# Patient Record
Sex: Female | Born: 1945 | Race: Black or African American | Hispanic: No | Marital: Single | Smoking: Never smoker
Health system: Southern US, Community
[De-identification: ages and names within clinical notes are randomized; demographics above are authoritative.]

## PROBLEM LIST (undated history)

## (undated) DIAGNOSIS — F319 Bipolar disorder, unspecified: Secondary | ICD-10-CM

## (undated) DIAGNOSIS — I10 Essential (primary) hypertension: Secondary | ICD-10-CM

## (undated) DIAGNOSIS — E669 Obesity, unspecified: Secondary | ICD-10-CM

## (undated) HISTORY — PX: ABDOMINAL HYSTERECTOMY: SHX81

---

## 2005-01-29 ENCOUNTER — Emergency Department (HOSPITAL_COMMUNITY): Admission: EM | Admit: 2005-01-29 | Discharge: 2005-01-29 | Payer: Self-pay | Admitting: Emergency Medicine

## 2005-01-29 IMAGING — CR DG CHEST 1V PORT
1 series · 1 of 1 positions shown · non-contrast
Comparison: none

CLINICAL DATA: Syncope.  Chest pain and hypertension. 
 CHEST PORTABLE ? 1 VIEW:
 The heart size and mediastinal contours are within normal limits.  The lungs are clear.

[view not recorded]
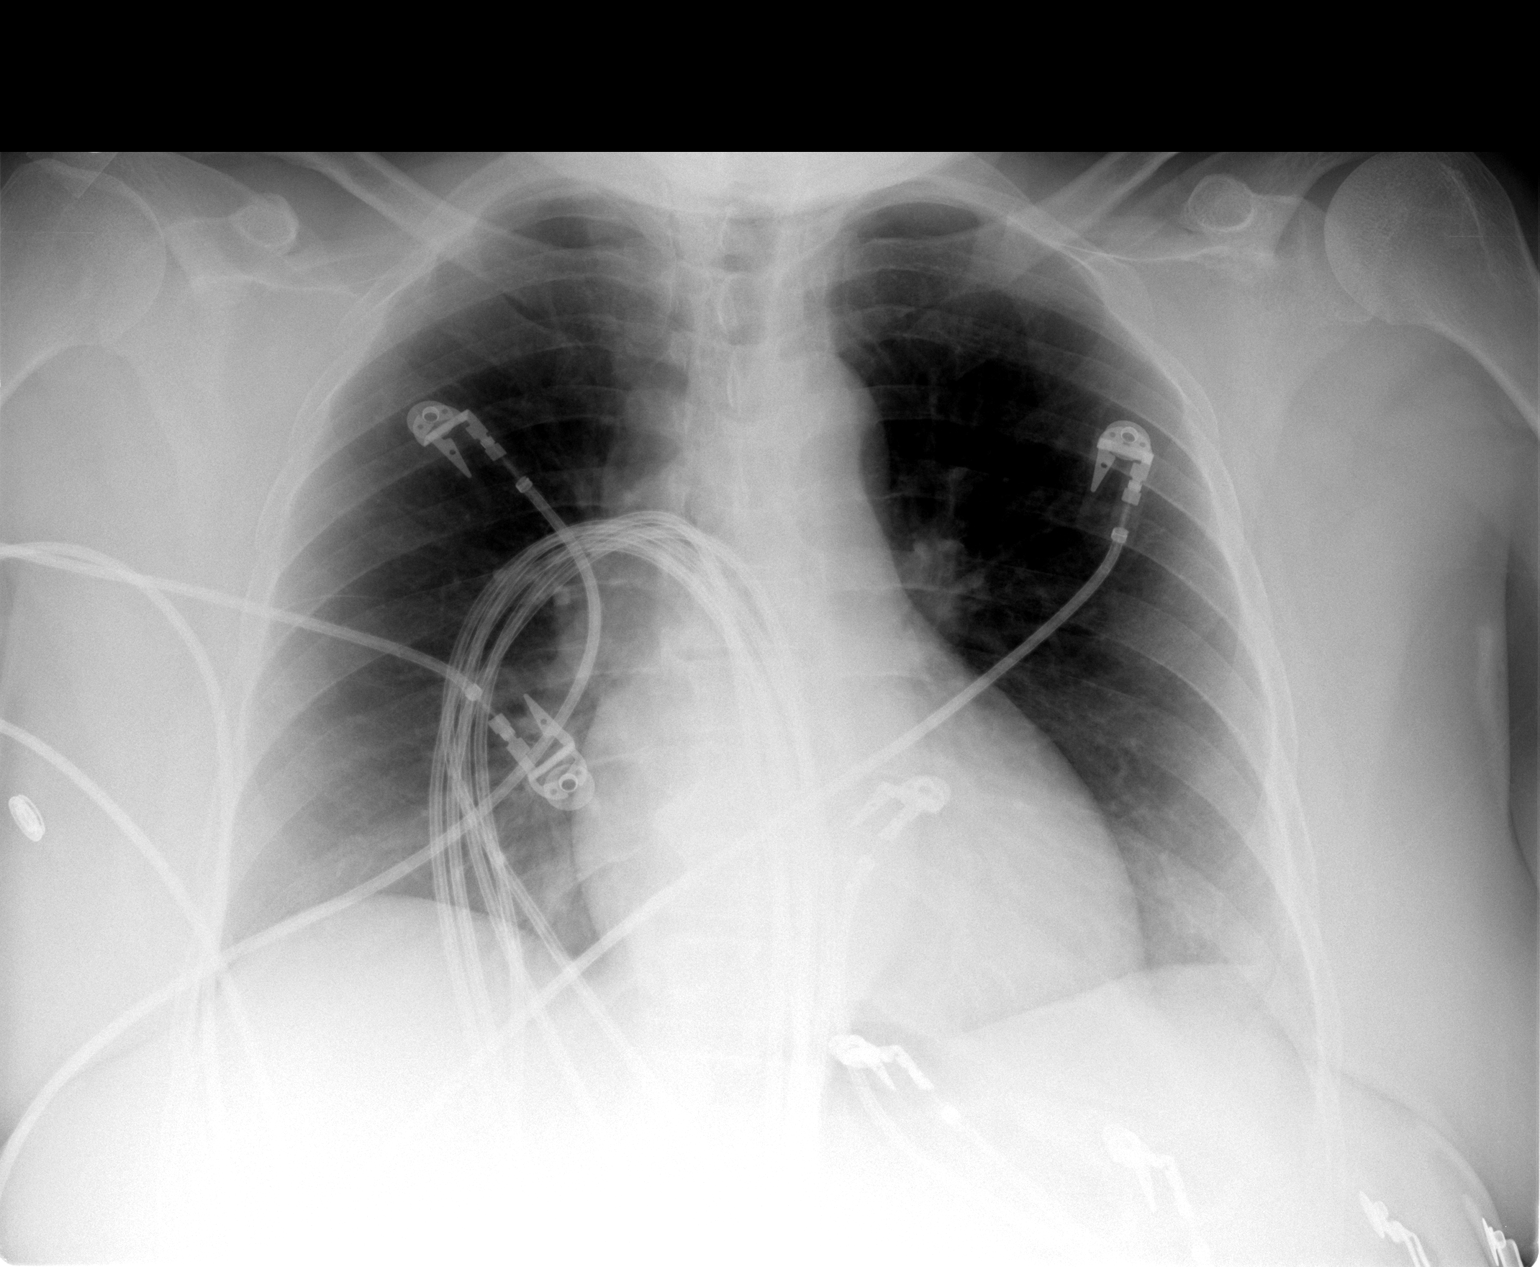

[1 of 1 positions shown; findings below may reference images not displayed]

IMPRESSION: No acute disease.

## 2005-01-29 IMAGING — CT CT HEAD W/O CM
1 of 2 series · 13 of 30 positions shown, 17 images · IV contrast (agent unspecified)
Comparison: none

CLINICAL DATA: Syncope vs. seizure.
 HEAD CT WITHOUT CONTRAST:
 5 mm scans are made through the whole head.  No prior study is available for comparison.  
 Brain has a normal appearance without atrophy, stroke, mass, hemorrhage, hydrocephalus or extraaxial collection.  The calvarium is unremarkable.  The paranasal sinus, middle ears and mastoids are clear.

[Series 2: brain · axial · 0.47mm/px · z∈[+126,+249]mm · 13 of 28 slices shown, 17 images]
[im 2/28  brain]
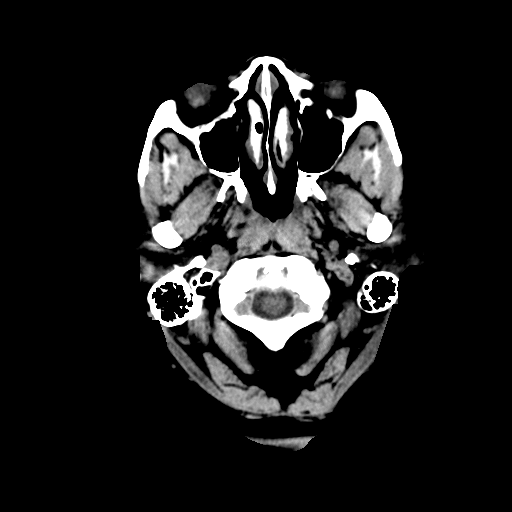
[im 2/28  bone]
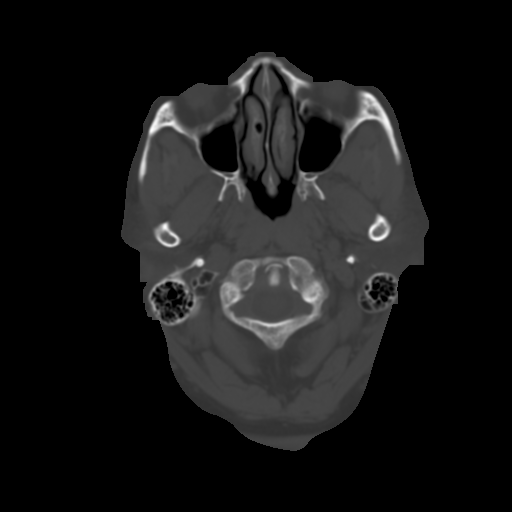
[im 4/28  brain]
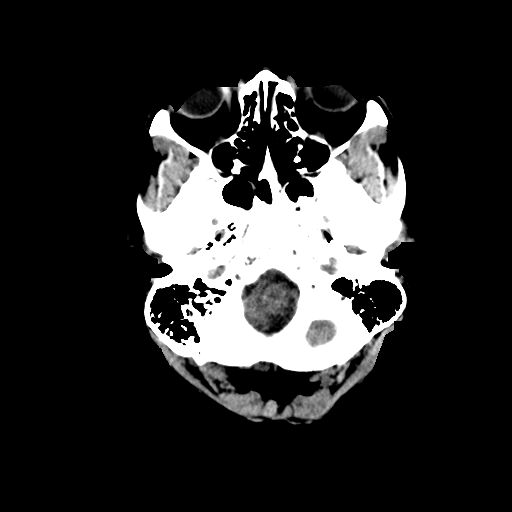
[im 6/28  brain]
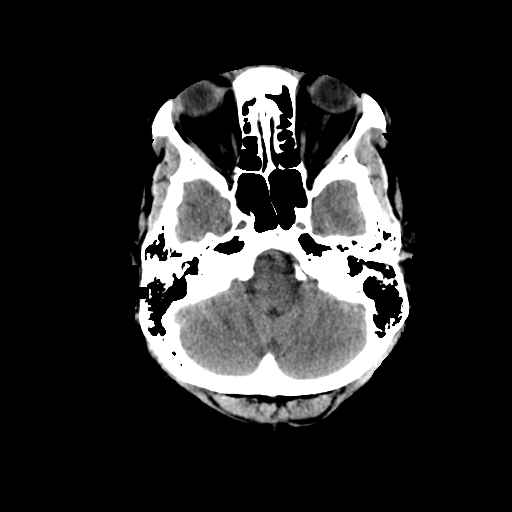
[im 8/28  brain]
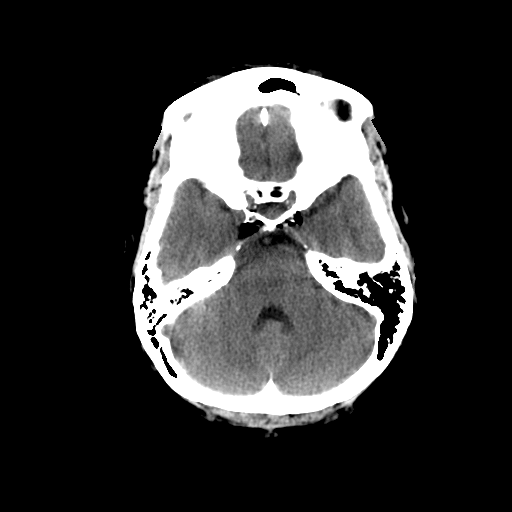
[im 10/28  brain]
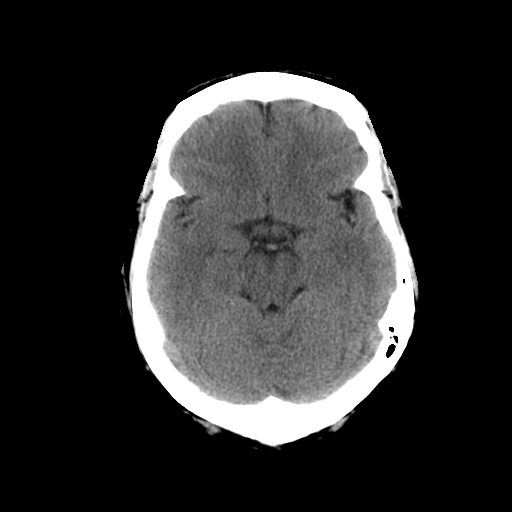
[im 10/28  bone]
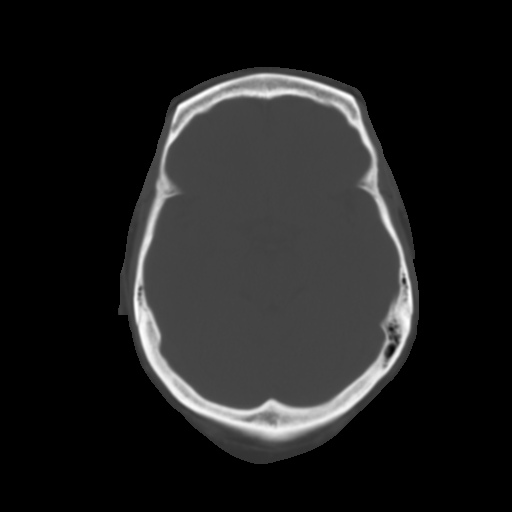
[im 12/28  brain]
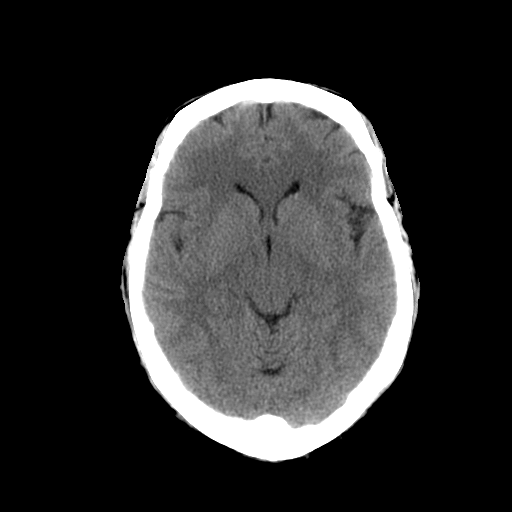
[im 14/28  brain]
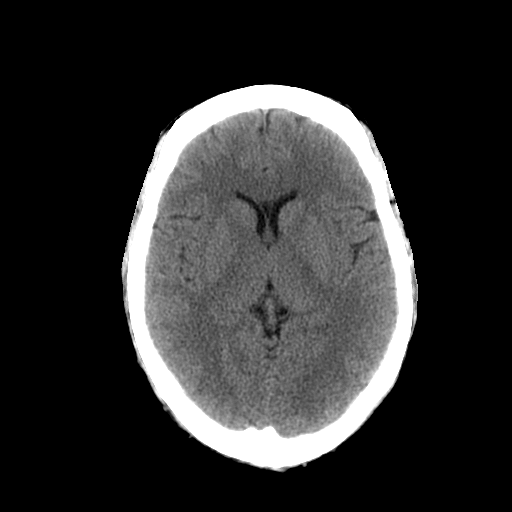
[im 16/28  brain]
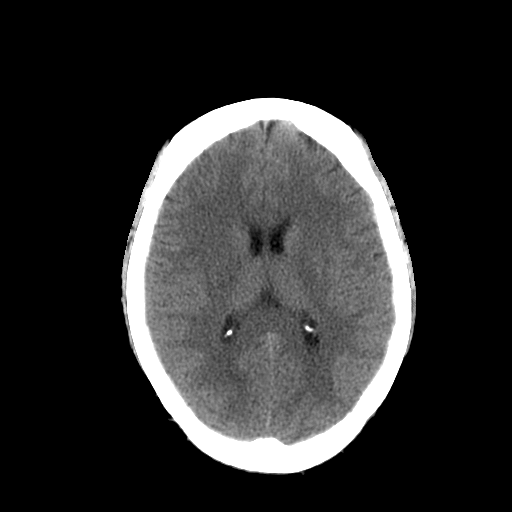
[im 18/28  brain]
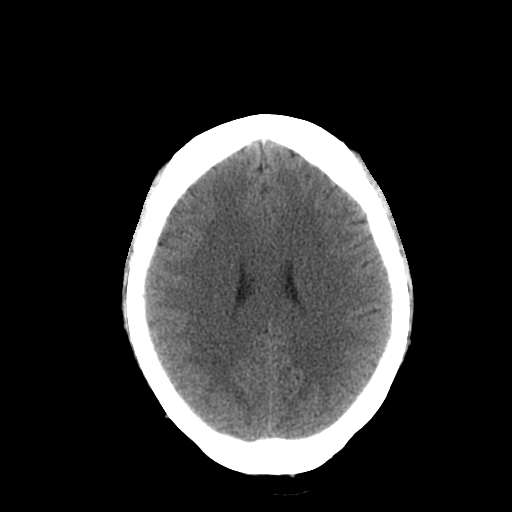
[im 18/28  bone]
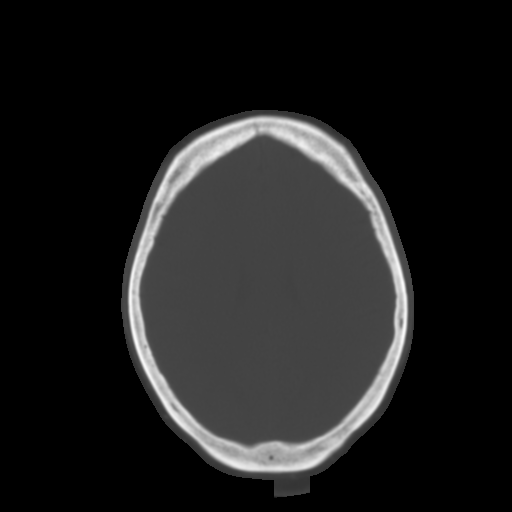
[im 20/28  brain]
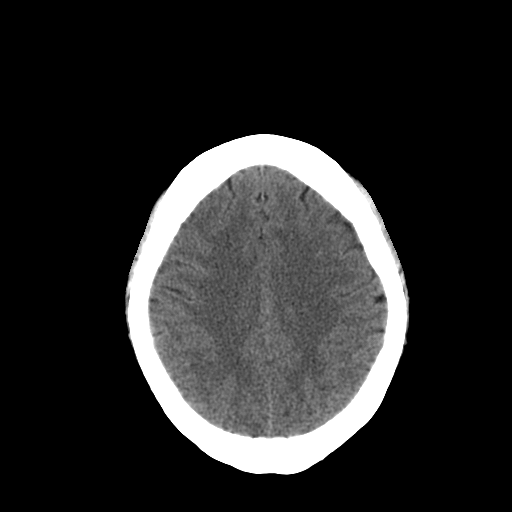
[im 22/28  brain]
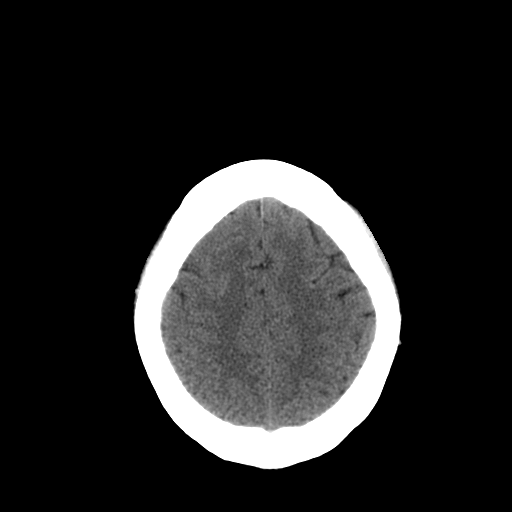
[im 24/28  brain]
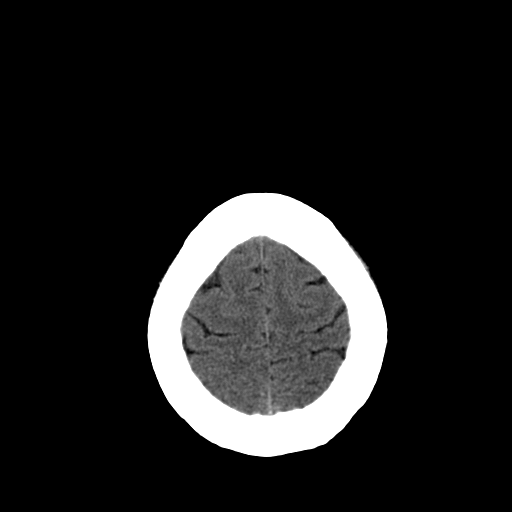
[im 26/28  brain]
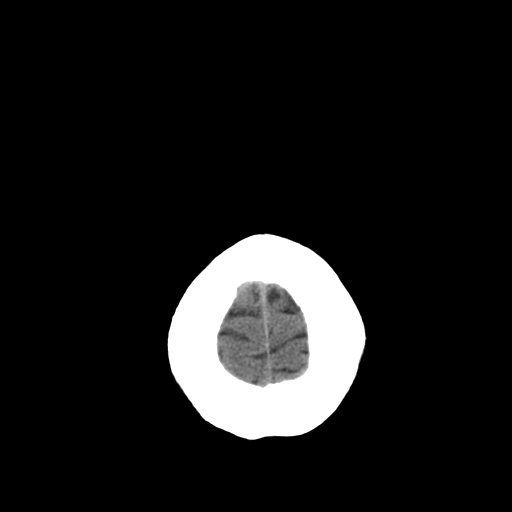
[im 26/28  bone]
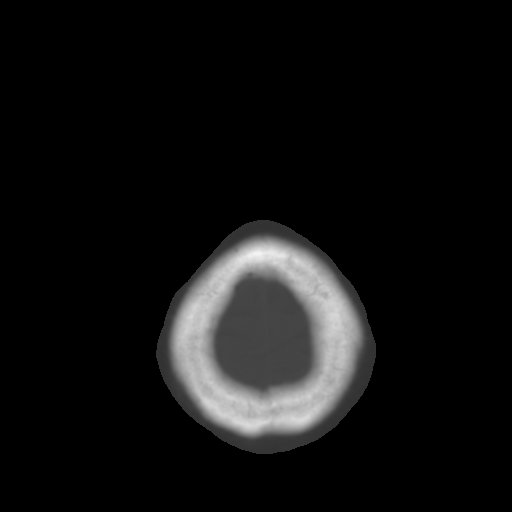

[13 of 30 positions shown; findings below may reference images not displayed]

IMPRESSION: Normal head CT.

## 2008-02-07 ENCOUNTER — Ambulatory Visit: Payer: Self-pay | Admitting: Gastroenterology

## 2008-02-13 ENCOUNTER — Ambulatory Visit: Payer: Self-pay | Admitting: Gastroenterology

## 2008-02-13 ENCOUNTER — Encounter: Payer: Self-pay | Admitting: Gastroenterology

## 2008-02-14 ENCOUNTER — Encounter: Payer: Self-pay | Admitting: Gastroenterology

## 2011-04-04 ENCOUNTER — Emergency Department (HOSPITAL_COMMUNITY)
Admission: EM | Admit: 2011-04-04 | Discharge: 2011-04-04 | Disposition: A | Discharge status: Home / Self Care | Payer: Medicare Other | Attending: Emergency Medicine | Admitting: Emergency Medicine

## 2011-04-04 ENCOUNTER — Emergency Department (HOSPITAL_COMMUNITY): Payer: Medicare Other

## 2011-04-04 DIAGNOSIS — R209 Unspecified disturbances of skin sensation: Secondary | ICD-10-CM | POA: Insufficient documentation

## 2011-04-04 DIAGNOSIS — I10 Essential (primary) hypertension: Secondary | ICD-10-CM | POA: Insufficient documentation

## 2011-04-04 DIAGNOSIS — R404 Transient alteration of awareness: Secondary | ICD-10-CM | POA: Insufficient documentation

## 2011-04-04 DIAGNOSIS — Z79899 Other long term (current) drug therapy: Secondary | ICD-10-CM | POA: Insufficient documentation

## 2011-04-04 DIAGNOSIS — R079 Chest pain, unspecified: Secondary | ICD-10-CM | POA: Insufficient documentation

## 2011-04-04 DIAGNOSIS — E785 Hyperlipidemia, unspecified: Secondary | ICD-10-CM | POA: Insufficient documentation

## 2011-04-04 DIAGNOSIS — M542 Cervicalgia: Secondary | ICD-10-CM | POA: Insufficient documentation

## 2011-04-04 LAB — DIFFERENTIAL
Eosinophils Absolute: 0.1 10*3/uL (ref 0.0–0.7)
Eosinophils Relative: 2 % (ref 0–5)
Lymphocytes Relative: 39 % (ref 12–46)
Lymphs Abs: 2 10*3/uL (ref 0.7–4.0)
Monocytes Absolute: 0.3 10*3/uL (ref 0.1–1.0)
Monocytes Relative: 6 % (ref 3–12)

## 2011-04-04 LAB — CBC
HCT: 33.4 % — ABNORMAL LOW (ref 36.0–46.0)
Hemoglobin: 11.9 g/dL — ABNORMAL LOW (ref 12.0–15.0)
MCH: 28.5 pg (ref 26.0–34.0)
Platelets: 114 10*3/uL — ABNORMAL LOW (ref 150–400)
RDW: 14.5 % (ref 11.5–15.5)
WBC: 5.2 10*3/uL (ref 4.0–10.5)

## 2011-04-04 LAB — COMPREHENSIVE METABOLIC PANEL
Albumin: 3.7 g/dL (ref 3.5–5.2)
Alkaline Phosphatase: 134 U/L — ABNORMAL HIGH (ref 39–117)
BUN: 15 mg/dL (ref 6–23)
CO2: 25 mEq/L (ref 19–32)
Chloride: 106 mEq/L (ref 96–112)
GFR calc Af Amer: >60 mL/min (ref >60)
GFR calc non Af Amer: >60 mL/min (ref >60)
Glucose, Bld: 120 mg/dL — ABNORMAL HIGH (ref 70–99)
Sodium: 141 mEq/L (ref 135–145)
Total Bilirubin: 0.1 mg/dL — ABNORMAL LOW (ref 0.3–1.2)

## 2011-04-04 LAB — TROPONIN I: Troponin I: <0.3 ng/mL (ref <0.30)

## 2011-04-04 LAB — CK TOTAL AND CKMB (NOT AT ARMC)
Relative Index: INVALID (ref 0.0–2.5)
Total CK: 72 U/L (ref 7–177)

## 2012-04-16 ENCOUNTER — Encounter (HOSPITAL_COMMUNITY): Payer: Self-pay | Admitting: *Deleted

## 2012-04-16 ENCOUNTER — Emergency Department (HOSPITAL_COMMUNITY): Payer: PRIVATE HEALTH INSURANCE

## 2012-04-16 ENCOUNTER — Observation Stay (HOSPITAL_COMMUNITY)
Admission: EM | Admit: 2012-04-16 | Discharge: 2012-04-17 | Disposition: A | Discharge status: Home / Self Care | Payer: PRIVATE HEALTH INSURANCE | Attending: Internal Medicine | Admitting: Internal Medicine

## 2012-04-16 DIAGNOSIS — Z23 Encounter for immunization: Secondary | ICD-10-CM | POA: Insufficient documentation

## 2012-04-16 DIAGNOSIS — T394X2A Poisoning by antirheumatics, not elsewhere classified, intentional self-harm, initial encounter: Secondary | ICD-10-CM | POA: Insufficient documentation

## 2012-04-16 DIAGNOSIS — I10 Essential (primary) hypertension: Secondary | ICD-10-CM | POA: Insufficient documentation

## 2012-04-16 DIAGNOSIS — T391X1A Poisoning by 4-Aminophenol derivatives, accidental (unintentional), initial encounter: Principal | ICD-10-CM | POA: Diagnosis present

## 2012-04-16 DIAGNOSIS — T391X2A Poisoning by 4-Aminophenol derivatives, intentional self-harm, initial encounter: Secondary | ICD-10-CM | POA: Diagnosis present

## 2012-04-16 DIAGNOSIS — F1010 Alcohol abuse, uncomplicated: Secondary | ICD-10-CM | POA: Insufficient documentation

## 2012-04-16 HISTORY — DX: Essential (primary) hypertension: I10

## 2012-04-16 LAB — RAPID URINE DRUG SCREEN, HOSP PERFORMED
Amphetamines: NOT DETECTED
Benzodiazepines: NOT DETECTED
Cocaine: NOT DETECTED
Opiates: NOT DETECTED
Tetrahydrocannabinol: NOT DETECTED

## 2012-04-16 LAB — COMPREHENSIVE METABOLIC PANEL
ALT: 16 U/L (ref 0–35)
Albumin: 3.9 g/dL (ref 3.5–5.2)
Alkaline Phosphatase: 129 U/L — ABNORMAL HIGH (ref 39–117)
Alkaline Phosphatase: 138 U/L — ABNORMAL HIGH (ref 39–117)
BUN: 8 mg/dL (ref 6–23)
BUN: 5 mg/dL — ABNORMAL LOW (ref 6–23)
CO2: 22 mEq/L (ref 19–32)
Calcium: 10 mg/dL (ref 8.4–10.5)
GFR calc Af Amer: >90 mL/min (ref >90)
GFR calc non Af Amer: >90 mL/min (ref >90)
Glucose, Bld: 114 mg/dL — ABNORMAL HIGH (ref 70–99)
Potassium: 3.3 mEq/L — ABNORMAL LOW (ref 3.5–5.1)
Sodium: 138 mEq/L (ref 135–145)
Sodium: 142 mEq/L (ref 135–145)
Total Protein: 7.3 g/dL (ref 6.0–8.3)
Total Protein: 7.5 g/dL (ref 6.0–8.3)

## 2012-04-16 LAB — PREGNANCY, URINE: Preg Test, Ur: NEGATIVE

## 2012-04-16 LAB — DIFFERENTIAL
Basophils Relative: 0 % (ref 0–1)
Eosinophils Absolute: 0.2 10*3/uL (ref 0.0–0.7)
Monocytes Relative: 7 % (ref 3–12)
Neutrophils Relative %: 36 % — ABNORMAL LOW (ref 43–77)

## 2012-04-16 LAB — CBC
MCH: 29.7 pg (ref 26.0–34.0)
MCHC: 35.3 g/dL (ref 30.0–36.0)
Platelets: 156 10*3/uL (ref 150–400)

## 2012-04-16 LAB — SALICYLATE LEVEL: Salicylate Lvl: <2 mg/dL — ABNORMAL LOW (ref 2.8–20.0)

## 2012-04-16 LAB — PROTIME-INR: INR: 1.06 (ref 0.00–1.49)

## 2012-04-16 LAB — URINALYSIS, ROUTINE W REFLEX MICROSCOPIC
Bilirubin Urine: NEGATIVE
Glucose, UA: NEGATIVE mg/dL
Hgb urine dipstick: NEGATIVE
Ketones, ur: NEGATIVE mg/dL
pH: 6.5 (ref 5.0–8.0)

## 2012-04-16 MED ORDER — POTASSIUM CHLORIDE 10 MEQ/100ML IV SOLN
10.0000 meq | INTRAVENOUS | Status: AC
  Administered 2012-04-16 (×3): 10 meq via INTRAVENOUS
  Filled 2012-04-16 (×3): qty 100

## 2012-04-16 MED ORDER — ATENOLOL 25 MG PO TABS
25.0000 mg | ORAL_TABLET | Freq: Every day | ORAL | Status: DC
  Administered 2012-04-16 – 2012-04-17 (×2): 25 mg via ORAL
  Filled 2012-04-16 (×2): qty 1

## 2012-04-16 MED ORDER — SODIUM CHLORIDE 0.9 % IV SOLN
1000.0000 mL | Freq: Once | INTRAVENOUS | Status: AC
  Administered 2012-04-16: 1000 mL via INTRAVENOUS

## 2012-04-16 MED ORDER — ONDANSETRON HCL 4 MG/2ML IJ SOLN: 4.0000 mg | Freq: Four times a day (QID) | INTRAMUSCULAR | Status: DC | PRN

## 2012-04-16 MED ORDER — SODIUM CHLORIDE 0.9 % IV SOLN: 1000.0000 mL | INTRAVENOUS | Status: DC

## 2012-04-16 MED ORDER — POTASSIUM CHLORIDE 10 MEQ/100ML IV SOLN
10.0000 meq | INTRAVENOUS | Status: DC
  Filled 2012-04-16: qty 100

## 2012-04-16 MED ORDER — ONDANSETRON HCL 4 MG PO TABS: 4.0000 mg | ORAL_TABLET | Freq: Four times a day (QID) | ORAL | Status: DC | PRN

## 2012-04-16 MED ORDER — DEXTROSE 5 % IV SOLN
15.0000 mg/kg/h | INTRAVENOUS | Status: DC
  Filled 2012-04-16: qty 200

## 2012-04-16 MED ORDER — PNEUMOCOCCAL VAC POLYVALENT 25 MCG/0.5ML IJ INJ
0.5000 mL | INJECTION | INTRAMUSCULAR | Status: AC
  Administered 2012-04-17: 0.5 mL via INTRAMUSCULAR
  Filled 2012-04-16: qty 0.5

## 2012-04-16 MED ORDER — SODIUM CHLORIDE 0.9 % IV SOLN
1000.0000 mL | INTRAVENOUS | Status: DC
  Administered 2012-04-16 – 2012-04-17 (×2): 1000 mL via INTRAVENOUS

## 2012-04-16 MED ORDER — ACETYLCYSTEINE 200 MG/ML IV SOLN
Freq: Once | INTRAVENOUS | Status: AC
  Administered 2012-04-16: 06:00:00 via INTRAVENOUS
  Filled 2012-04-16: qty 200

## 2012-04-16 MED ORDER — ACETYLCYSTEINE LOAD VIA INFUSION
150.0000 mg/kg | Freq: Once | INTRAVENOUS | Status: DC
  Filled 2012-04-16: qty 273
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
## 2012-04-16 NOTE — ED Notes (Signed)
Report received-airway intact-no s/s's of distress-sitter outside room-will continue to monitor 

## 2012-04-16 NOTE — ED Notes (Signed)
Charge nurse assigned NT to patient. Pt intoxicated ETOH, also crack cocaine and tylenol on board. RN took vitals, pt. Slept thru it.  ACT team came to assess pt, but pt was too out of it and did not wake up.

## 2012-04-16 NOTE — ED Provider Notes (Signed)
History     CSN: [id number]  Arrival date & time 04/16/12  0245   First MD Initiated Contact with Patient 04/16/12 0253      Chief Complaint  Patient presents with  . Ingestion     HPI The patient presents with suicidal intent.  She notes that she started living, wants to die.  She has not explicitly stated that she has ingested anything, but per EMS report, the patient has been ingesting crack cocaine, alcohol, and Tylenol.  He notes up to 10 or 15 500 mg tablets of Tylenol have been taken throughout the day.  The patient denies any physical complaints, though she is largely noncooperative with my evaluation.  She initially defers any offer of intervention, specifically IV fluids, labs, evaluation, but acquiesces.  She states that she has been hospitalized for psychiatric reasons in the past, and in general has been doing well recently.  Past Medical History  Diagnosis Date  . Hypertension     History reviewed. No pertinent past surgical history.  History reviewed. No pertinent family history.  History  Substance Use Topics  . Smoking status: Not on file  . Smokeless tobacco: Not on file  . Alcohol Use: Yes    OB History    Grav Para Term Preterm Abortions TAB SAB Ect Mult Living                  Review of Systems  Unable to perform ROS: Psychiatric disorder    Allergies  Review of patient's allergies indicates no known allergies.  Home Medications  No current outpatient prescriptions on file.  BP 164/86  Pulse 94  Temp 98 F (36.7 C) (Oral)  Resp 18  SpO2 98%  Physical Exam  Nursing note and vitals reviewed. Constitutional: She is oriented to person, place, and time. She appears well-developed and well-nourished. No distress.  HENT:  Head: Normocephalic and atraumatic.  Eyes: Conjunctivae and EOM are normal.  Cardiovascular: Normal rate and regular rhythm.   Pulmonary/Chest: Effort normal and breath sounds normal. No stridor. No respiratory distress.    Abdominal: She exhibits no distension.  Musculoskeletal: She exhibits no edema.  Neurological: She is alert and oriented to person, place, and time. No cranial nerve deficit.  Skin: Skin is warm and dry.       Mild ecchymotic lesions on the upper extremity, with no focal deformity, nor any loss of strength in her opportunities.  Psychiatric: Her speech is delayed. She is withdrawn. She expresses inappropriate judgment. She exhibits a depressed mood. She expresses suicidal ideation. She expresses suicidal plans. She is noncommunicative.    ED Course  Procedures (including critical care time)   Labs Reviewed  CBC  DIFFERENTIAL  COMPREHENSIVE METABOLIC PANEL  URINE RAPID DRUG SCREEN (HOSP PERFORMED)  ETHANOL  SALICYLATE LEVEL  URINALYSIS, ROUTINE W REFLEX MICROSCOPIC  ACETAMINOPHEN LEVEL   No results found.   Date: 04/16/2012  Rate: 85  Rhythm: normal sinus rhythm  QRS Axis: normal  Intervals: normal  ST/T Wave abnormalities: normal  Conduction Disutrbances: none  Narrative Interpretation: unremarkable  Pulse ox 99%ra    No diagnosis found.  The patient's labs are notable for elevated acetaminophen level and alcohol as well.  I discussed the case with PCCM, who rec's hospitalist admit, with them following.  MDM  This 66 year old female presents after a suicide attempt.  Notably, the patient states that the only thing wrong with her is that she is not dead.  On my initial exam  the patient defers all attempts at intervention, evaluation.  Following some discussion, the patient acquiesced, and labs were drawn.  Notably, the patient's initial evaluation demonstrated an alcohol level is elevated, as well as a toxic Tylenol level.  Given these findings, the patient continued to receive IV fluids, and was administered IV fluids as well as n-acetylcysteine.  Initial labs were notable for elevated liver enzymes, which is somewhat reassuring, but given the degree of Tylenol toxicity,  I discussed the case with our intensive care team.  We agreed the patient will be treated with IV medication, have serial Tylenol levels, liver function tests drawn, and be admitted for this evaluation and monitoring.  Though the patient's initial concern was for suicidal ideation, intent, her initial treatment will be for her overdose, with anticipation of a psychiatric evaluation as an inpatient. Given the patient's defiance, her suicidal attempt, I had the initiated the process of involuntary commitment.  Behavioral health is assisting with this process.  CRITICAL CARE Performed by: Gerhard Munch   Total critical care time: 35  Critical care time was exclusive of separately billable procedures and treating other patients.  Critical care was necessary to treat or prevent imminent or life-threatening deterioration.  Critical care was time spent personally by me on the following activities: development of treatment plan with patient and/or surrogate as well as nursing, discussions with consultants, evaluation of patient's response to treatment, examination of patient, obtaining history from patient or surrogate, ordering and performing treatments and interventions, ordering and review of laboratory studies, ordering and review of radiographic studies, pulse oximetry and re-evaluation of patient's condition.       Gerhard Munch, MD 04/16/12 [phone]

## 2012-04-16 NOTE — H&P (Signed)
PCP:   No primary provider on file.   Chief Complaint:  Tylenol ingestion, intending to kill self.   HPI: Ms Rivkin apparently took 10-15 tylenol tablets of 500mg  strength around 5pm last night, intending to kill herself. She also drank alcohol, ?crack cocaine. She is not volunteering a meaningful history at the time of my evaluation. She says I should leave her alone. She has since been placed on aceta dote, and been involuntarily committed. Her labs showed a tylenol level of 234, normal lfts. Salicylate levels were normal.  Review of Systems:  Could not be obtained due to lack of patient cooperation.  . Past Medical History: Past Medical History  Diagnosis Date  . Hypertension    History reviewed. No pertinent past surgical history.  Medications: Prior to Admission medications   Not on File    Allergies:  No Known Allergies  Social History:  does not have a smoking history on file. She does not have any smokeless tobacco history on file. She reports that she drinks alcohol. Her drug history not on file.  Family History: History reviewed. No pertinent family history.  Physical Exam: Filed Vitals:   04/16/12 0246 04/16/12 0510 04/16/12 0552 04/16/12 0755  BP: 164/86  116/60 143/80  Pulse: 94  74 88  Temp: 98 F (36.7 C)  98 F (36.7 C) 97.6 F (36.4 C)  TempSrc: Oral  Oral Oral  Resp: 18  17 16   Height:  5\' 6"  (1.676 m)    Weight:  72.576 kg (160 lb)    SpO2: 98%  95% 99%   Sleeping. Apathetic. No jvd. Lungs clear. S1S2.RRR. No murmurs. Abdomen- soft, non tender. +BS. CNS- irritable but grossly Intact. Extremities- no pedal edema.  Labs on Admission:   [facility] 04/16/12 0310  NA 138  K 3.3*  CL 101  CO2 23  GLUCOSE 96  BUN 8  CREATININE 0.71  CALCIUM 10.3  MG --  PHOS --    Basename 04/16/12 0310  AST 18  ALT 15  ALKPHOS 129*  BILITOT 0.3  PROT 7.3  ALBUMIN 3.9   No results found for this basename: LIPASE:2,AMYLASE:2 in the last 72  hours  Basename 04/16/12 0310  WBC 6.6  NEUTROABS 2.4  HGB 12.3  HCT 34.8*  MCV 84.1  PLT 156   No results found for this basename: CKTOTAL:3,CKMB:3,CKMBINDEX:3,TROPONINI:3 in the last 72 hours No results found for this basename: TSH,T4TOTAL,FREET3,T3FREE,THYROIDAB in the last 72 hours No results found for this basename: VITAMINB12:2,FOLATE:2,FERRITIN:2,TIBC:2,IRON:2,RETICCTPCT:2 in the last 72 hours  Radiological Exams on Admission: Dg Chest Port 1 View  04/16/2012  *RADIOLOGY REPORT*  Clinical Data: Overdose.  Combative patient.  PORTABLE CHEST - 1 VIEW  Comparison: None.  Findings: Technically limited study due to patient positioning. Shallow inspiration.  As visualized, the heart size and pulmonary vascularity are normal and there is no focal consolidation or edema in the lungs.  No blunting of costophrenic angles.  No apparent pneumothorax.  IMPRESSION: Technically limited study.  No evidence of active pulmonary disease.  Original Report Authenticated By: Marlon Pel, M.D.    Assessment  Tylenol overdose in female with alcohol ingestion as well, in suicidal attempt. Time and dose of tylenol taken unclear. Level elevated.  Plan  .Tylenol overdose- admit med surg with 1:1 bed sitter. Continue N acetyl cysteine per protocol. Patient would not cooperate for activated charcoal. Monitor lfts/inr/tylenol level. .Suicide attempt by acetaminophen overdose- consult psych. scds.  Condition closely guarded.  Ariyana Faw [phone] 04/16/2012, 8:18  AM  

## 2012-04-16 NOTE — ED Notes (Signed)
Pt ambulated to the restroom. When back in room, pt ask "why can't I just go home."

## 2012-04-16 NOTE — Progress Notes (Signed)
MEDICATION RELATED CONSULT NOTE - INITIAL   Pharmacy Consult for  acetylcysteine   Indication: Acetaminophen overdose  No Known Allergies  Patient Measurements: Height: 5\' 6"  (167.6 cm) Weight: 160 lb (72.576 kg) IBW/kg (Calculated) : 59.3   Vital Signs: Temp: 97.6 F (36.4 C) (06/24 0755) Temp src: Oral (06/24 0755) BP: 143/80 mmHg (06/24 0755) Pulse Rate: 88  (06/24 0755)  Intake/Output from previous day:    Labs:  [facility] 04/16/12 0310  WBC 6.6  HGB 12.3  HCT 34.8*  PLT 156  APTT --  CREATININE 0.71  LABCREA --  CREATININE 0.71  CREAT24HRUR --  MG --  PHOS --  ALBUMIN 3.9  PROT 7.3  ALBUMIN 3.9  AST 18  ALT 15  ALKPHOS 129*  BILITOT 0.3  BILIDIR --  IBILI --    Lab 04/16/12 0813  INR 1.06    Estimated Creatinine Clearance: 72.5 ml/min (by C-G formula based on Cr of 0.71).   Medications:  Scheduled:    . sodium chloride  1,000 mL Intravenous Once  . dextrose 5 % 200 mL with acetylcysteine (ACETADOTE) 10,900 mg infusion   Intravenous Once  . potassium chloride  10 mEq Intravenous Q1 Hr x 3  . DISCONTD: acetylcysteine  150 mg/kg Intravenous Once   Infusions:    . sodium chloride     Assessment:  66 YOF admit with suicidal ideation and overdose (EMS reports crack cocaine, liquor, beer, and 10-15 Tylenol 500mg  tabs.     Pt with late presentation (with unknown time frame of last ingestion, maybe about 6pm on 6/23)  AST/ALT wnl (6/24), INR wnl (6/24)  Acetaminophen level elevated at baseline = 234 (6/24 0300)  Ethyl alcohol level 238  Acetylcysteine 10,900mg  loading dose given 6/24 0600 in ED  Repeat Acetaminophen level pending collection   Goal of Therapy: Undetectable Acetaminophen level  Plan:   Baseline urine pregnancy test pending  Acetylcysteine 15mg /kg/hr IV infusion  Acetaminophen level 22 hours after maintenance infusion  Follow up labs  Lynann Beaver PharmD, BCPS Pager [phone] 04/16/2012 8:58 AM

## 2012-04-16 NOTE — BHH Counselor (Signed)
IVC paperwork and [facility] [employer] faxed to [facility]. Originals placed in pt's chart. Magistrate Bing Plume confirmed receipt.

## 2012-04-16 NOTE — BHH Counselor (Signed)
Writer attempted to assess patient but pt too drowsy. Will attempt to assess later.

## 2012-04-16 NOTE — ED Notes (Signed)
Report given-transfer to 1506

## 2012-04-16 NOTE — ED Notes (Signed)
Pt not wanting assistance with ambulation-sitter assisted-pt tearful, questioning why she is here at hospital-why she is being monitored-will continue to monitor

## 2012-04-16 NOTE — Progress Notes (Signed)
CARE MANAGEMENT NOTE 04/16/2012  Patient:  Christie Meza, Christie Meza   Account Number:  [id number]  Date Initiated:  04/16/2012  Documentation initiated by:  Hamzah Savoca  Subjective/Objective Assessment:   pt with intentional overdose of tylenol     Action/Plan:   lives at home   Anticipated DC Date:  04/19/2012   Anticipated DC Plan:  HOME/SELF CARE  In-house referral  Clinical Social Worker      DC Planning Services  NA      [facility] Choice  NA   Choice offered to / List presented to:  NA   DME arranged  NA      DME agency  NA     HH arranged  NA      HH agency  NA   Status of service:  In process, will continue to follow Medicare Important Message given?  NA - LOS <3 / Initial given by admissions (If response is "NO", the following Medicare IM given date fields will be blank) Date Medicare IM given:   Date Additional Medicare IM given:    Discharge Disposition:    Per UR Regulation:  Reviewed for med. necessity/level of care/duration of stay  If discussed at Long Length of Stay Meetings, dates discussed:    Comments:  [id number] Marcelle Smiling, RN, BSN, CCM No discharge needs present at time of this review Case Management [phone]

## 2012-04-16 NOTE — ED Notes (Signed)
BJY:[id number]<FA> Expected date:04/16/12<BR> Expected time: 2:20 AM<BR> Means of arrival:Ambulance<BR> Comments:<BR> Overdose, SI

## 2012-04-16 NOTE — ED Notes (Signed)
Pt ambulated to the restroom. Continues to ask when she can go home.

## 2012-04-17 ENCOUNTER — Encounter (HOSPITAL_COMMUNITY): Payer: Self-pay | Admitting: *Deleted

## 2012-04-17 DIAGNOSIS — F1410 Cocaine abuse, uncomplicated: Secondary | ICD-10-CM

## 2012-04-17 DIAGNOSIS — F1010 Alcohol abuse, uncomplicated: Secondary | ICD-10-CM

## 2012-04-17 DIAGNOSIS — T391X1A Poisoning by 4-Aminophenol derivatives, accidental (unintentional), initial encounter: Secondary | ICD-10-CM

## 2012-04-17 DIAGNOSIS — T394X2A Poisoning by antirheumatics, not elsewhere classified, intentional self-harm, initial encounter: Secondary | ICD-10-CM

## 2012-04-17 LAB — COMPREHENSIVE METABOLIC PANEL
AST: 18 U/L (ref 0–37)
Albumin: 3.4 g/dL — ABNORMAL LOW (ref 3.5–5.2)
BUN: 9 mg/dL (ref 6–23)
Chloride: 108 mEq/L (ref 96–112)
Creatinine, Ser: 0.69 mg/dL (ref 0.50–1.10)
Total Bilirubin: 0.5 mg/dL (ref 0.3–1.2)
Total Protein: 6.4 g/dL (ref 6.0–8.3)

## 2012-04-17 LAB — CBC
HCT: 33.8 % — ABNORMAL LOW (ref 36.0–46.0)
Hemoglobin: 11.7 g/dL — ABNORMAL LOW (ref 12.0–15.0)
MCH: 29.6 pg (ref 26.0–34.0)
MCV: 85.6 fL (ref 78.0–100.0)
RBC: 3.95 MIL/uL (ref 3.87–5.11)
WBC: 5.4 10*3/uL (ref 4.0–10.5)

## 2012-04-17 MED ORDER — TRAMADOL HCL 50 MG PO TABS
50.0000 mg | ORAL_TABLET | Freq: Four times a day (QID) | ORAL | Status: DC | PRN
  Administered 2012-04-17: 50 mg via ORAL
  Filled 2012-04-17: qty 1

## 2012-04-17 MED ORDER — TRAMADOL HCL 50 MG PO TABS: 50.0000 mg | ORAL_TABLET | Freq: Four times a day (QID) | ORAL | Status: AC | PRN

## 2012-04-17 MED ORDER — AMLODIPINE BESYLATE 5 MG PO TABS: 5.0000 mg | ORAL_TABLET | Freq: Every day | ORAL | Status: DC

## 2012-04-17 MED ORDER — POTASSIUM CHLORIDE 20 MEQ/15ML (10%) PO LIQD
40.0000 meq | Freq: Once | ORAL | Status: AC
  Administered 2012-04-17: 40 meq via ORAL
  Filled 2012-04-17: qty 30

## 2012-04-17 MED ORDER — AMLODIPINE BESYLATE 5 MG PO TABS
5.0000 mg | ORAL_TABLET | Freq: Every day | ORAL | Status: DC
  Administered 2012-04-17: 5 mg via ORAL
  Filled 2012-04-17: qty 1

## 2012-04-17 MED ORDER — ATENOLOL 25 MG PO TABS: 25.0000 mg | ORAL_TABLET | Freq: Every day | ORAL | Status: DC

## 2012-04-17 NOTE — Discharge Summary (Signed)
DISCHARGE SUMMARY  Christie Meza  MR#: [id number]  DOB:June 14, 1946  Date of Admission: 04/16/2012 Date of Discharge: 04/17/2012  Attending Physician:Milee Qualls  Patient's PCP:No primary provider on file.  Consults: Rocky Morel  Discharge Diagnoses: Present on Admission:  .Tylenol overdose .Suicide attempt by acetaminophen overdose   Hospital Course: Christie Meza was admitted with tylenol over dose(10-15 tablets of 500mg  strength). She now says she took them for arthritic pain relief, although she suggested she had wanted to kill herself on the day of admission. She has mentioned to me and to Dr Ferol Luz she did it for pain relief, and states that she was too drunk to say anything useful at admission. Her tylenol level/lfts are now normal and she is eager to go home, especially after Psych eval performed by Dr Ferol Luz today. I have encouraged Christie Meza to find a PCP for health maintenance and referral to rheumatology for her arthritis. She is discharged in stable condition.   Medication List  As of 04/17/2012 12:55 PM   STOP taking these medications         acetaminophen 500 MG tablet      ibuprofen 200 MG tablet         TAKE these medications         amLODipine 5 MG tablet   Commonly known as: NORVASC   Take 1 tablet (5 mg total) by mouth daily.      atenolol 25 MG tablet   Commonly known as: TENORMIN   Take 1 tablet (25 mg total) by mouth daily.      fish oil-omega-3 fatty acids 1000 MG capsule   Take 2 g by mouth daily.      multivitamin with minerals Tabs   Take 1 tablet by mouth daily.      traMADol 50 MG tablet   Commonly known as: ULTRAM   Take 1 tablet (50 mg total) by mouth every 6 (six) hours as needed.             Day of Discharge BP 159/78  Pulse 59  Temp 97.3 F (36.3 C) (Oral)  Resp 18  Ht 5\' 6"  (1.676 m)  Wt 72.576 kg (160 lb)  BMI 25.82 kg/m2  SpO2 96%  Physical Exam: At baseline.  Results for orders placed during the hospital  encounter of 04/16/12 (from the past 24 hour(s))  ACETAMINOPHEN LEVEL     Status: Abnormal   Collection Time   04/16/12  2:10 PM      Component Value Range   Acetaminophen (Tylenol), Serum 73.6 (*) 10 - 30 ug/mL  COMPREHENSIVE METABOLIC PANEL     Status: Abnormal   Collection Time   04/16/12  2:10 PM      Component Value Range   Sodium 142  135 - 145 mEq/L   Potassium 3.8  3.5 - 5.1 mEq/L   Chloride 104  96 - 112 mEq/L   CO2 22  19 - 32 mEq/L   Glucose, Bld 114 (*) 70 - 99 mg/dL   BUN 5 (*) 6 - 23 mg/dL   Creatinine, Ser 4.09  0.50 - 1.10 mg/dL   Calcium 81.1  8.4 - 91.4 mg/dL   Total Protein 7.5  6.0 - 8.3 g/dL   Albumin 3.9  3.5 - 5.2 g/dL   AST 18  0 - 37 U/L   ALT 16  0 - 35 U/L   Alkaline Phosphatase 138 (*) 39 - 117 U/L   Total Bilirubin 0.3  0.3 - 1.2  mg/dL   GFR calc non Af Amer >90  >90 mL/min   GFR calc Af Amer >90  >90 mL/min  COMPREHENSIVE METABOLIC PANEL     Status: Abnormal   Collection Time   04/17/12  4:45 AM      Component Value Range   Sodium 140  135 - 145 mEq/L   Potassium 3.5  3.5 - 5.1 mEq/L   Chloride 108  96 - 112 mEq/L   CO2 22  19 - 32 mEq/L   Glucose, Bld 89  70 - 99 mg/dL   BUN 9  6 - 23 mg/dL   Creatinine, Ser 1.61  0.50 - 1.10 mg/dL   Calcium 9.8  8.4 - 09.6 mg/dL   Total Protein 6.4  6.0 - 8.3 g/dL   Albumin 3.4 (*) 3.5 - 5.2 g/dL   AST 18  0 - 37 U/L   ALT 13  0 - 35 U/L   Alkaline Phosphatase 109  39 - 117 U/L   Total Bilirubin 0.5  0.3 - 1.2 mg/dL   GFR calc non Af Amer >90  >90 mL/min   GFR calc Af Amer >90  >90 mL/min  CBC     Status: Abnormal   Collection Time   04/17/12  4:45 AM      Component Value Range   WBC 5.4  4.0 - 10.5 K/uL   RBC 3.95  3.87 - 5.11 MIL/uL   Hemoglobin 11.7 (*) 12.0 - 15.0 g/dL   HCT 04.5 (*) 40.9 - 81.1 %   MCV 85.6  78.0 - 100.0 fL   MCH 29.6  26.0 - 34.0 pg   MCHC 34.6  30.0 - 36.0 g/dL   RDW 91.4  78.2 - 95.6 %   Platelets 148 (*) 150 - 400 K/uL  APTT     Status: Normal   Collection Time    04/17/12  4:45 AM      Component Value Range   aPTT 29  24 - 37 seconds  ACETAMINOPHEN LEVEL     Status: Normal   Collection Time   04/17/12  4:45 AM      Component Value Range   Acetaminophen (Tylenol), Serum <15.0  10 - 30 ug/mL    Disposition: home today.   Follow-up Appts: Discharge Orders    Future Orders Please Complete By Expires   Diet - low sodium heart healthy      Increase activity slowly          Time spent in discharge (includes decision making & examination of pt): 15 minutes  Signed: Mase Dhondt 04/17/2012, 12:55 PM

## 2012-04-17 NOTE — Progress Notes (Signed)
Per psych MD, Pt ok to d/c home.  Pt not needing outpt resources, per psych MD.  Notified RN.  MD signed First Examination to rescind IVC.  Faxed signed First Examination to [employer].  Pt to d/c.  Providence Crosby, LCSWA Clinical Social Work [phone]

## 2012-04-17 NOTE — Consult Note (Signed)
Patient Identification:  Christie Meza Date of Evaluation:  04/17/2012 Reason for Consult: Tylenol Overdose  Referring Provider: Dr. Venetia Constable  History of Present Illness: Pt is sitting up in bed. She is awake, alert and states she was in great 10/10 worst pain and had run out of motrin.  All she had was tylenol.  She believes she took one about every 30 min.  She told ED she wanted to die.  She also told ED staff she had been drinking alcohol and using cocaine [NB TOX Screen for cocaine = neg].   Past Psychiatric History:Non specific hx of psychiatric care in the past.   Past Medical History:     Past Medical History  Diagnosis Date  . Hypertension        Past Surgical History  Procedure Date  . Abdominal hysterectomy     Allergies: No Known Allergies  Current Medications:  Prior to Admission medications   Medication Sig Start Date End Date Taking? Authorizing Provider  fish oil-omega-3 fatty acids 1000 MG capsule Take 2 g by mouth daily.   Yes Historical Provider, MD  Multiple Vitamin (MULTIVITAMIN WITH MINERALS) TABS Take 1 tablet by mouth daily.   Yes Historical Provider, MD  amLODipine (NORVASC) 5 MG tablet Take 1 tablet (5 mg total) by mouth daily. 04/17/12 04/17/13  Simbiso Ranga, MD  atenolol (TENORMIN) 25 MG tablet Take 1 tablet (25 mg total) by mouth daily. 04/17/12 04/17/13  Simbiso Ranga, MD  traMADol (ULTRAM) 50 MG tablet Take 1 tablet (50 mg total) by mouth every 6 (six) hours as needed. 04/17/12 04/27/12  Conley Canal, MD    Social History:    reports that she has never smoked. She has never used smokeless tobacco. She reports that she drinks alcohol. She reports that she uses illicit drugs (Cocaine).   Family History:    History reviewed. No pertinent family history.  Mental Status Examination/Evaluation: Objective:  Appearance: Well Groomed  Psychomotor Activity:  Normal  Eye Contact::  Good  Speech:  Clear and Coherent  Volume:  Normal  Mood:  Anxious and  Dysphoric  Affect:  Appropriate  Thought Process:  Coherent  Orientation:  Full  Thought Content: focused upon returning home NB very guarded no reference to OD  Suicidal Thoughts:  No minimized act of OD/Tylenol  Homicidal Thoughts:  No  Judgement:  Fair  Insight:  Shallow    DIAGNOSIS:   AXIS I   Cocaine abuse, Alcohol Abuse, Intentional overdose  AXIS II  Deffered  AXIS III See medical notes.  AXIS IV economic problems, occupational problems, other psychosocial or environmental problems, problems related to social environment and problems with primary support group  AXIS V 61-70 mild symptoms   Assessment/Plan: Discussed with Dr. Venetia Constable, Psych CSW  Pt is awake, alert and fully oriented.  She complains of arthritis in her knee 10,10 worst  Pain.  She had taken quite a few Tylenol because she ran out of Motrin.   She denies suicidal thoughts, She dismisses her admission as all about pain control.  She denies AH/VH drugs and alcohol [NB Pt had an elevated alcohol level.] She says she has children and only wants to return home and see her dog. She has an energetic affect and denies any problems; dismissing her admission only as a need for pain control.  She does not reference earlier comments of wanting to die.  RECOMMENDATION:  1.  Pt has capacity. 2.  Pt has no interest in inpatient psychiatric care  3. Pt may be discharged to home when medically stable.  4.No further psychiatric needs identified.  MD Psychiatrist signs off.  Aelyn Stanaland J. Ferol Luz, MD Psychiatrist 04/17/2012 4:49 PM

## 2013-01-11 ENCOUNTER — Encounter (HOSPITAL_COMMUNITY): Payer: Self-pay | Admitting: *Deleted

## 2013-01-11 ENCOUNTER — Emergency Department (HOSPITAL_COMMUNITY)
Admission: EM | Admit: 2013-01-11 | Discharge: 2013-01-12 | Disposition: A | Discharge status: Home / Self Care | Payer: Medicare Other | Attending: Emergency Medicine | Admitting: Emergency Medicine

## 2013-01-11 DIAGNOSIS — R300 Dysuria: Secondary | ICD-10-CM | POA: Insufficient documentation

## 2013-01-11 DIAGNOSIS — Z79899 Other long term (current) drug therapy: Secondary | ICD-10-CM | POA: Insufficient documentation

## 2013-01-11 DIAGNOSIS — N390 Urinary tract infection, site not specified: Secondary | ICD-10-CM

## 2013-01-11 DIAGNOSIS — I10 Essential (primary) hypertension: Secondary | ICD-10-CM | POA: Insufficient documentation

## 2013-01-11 DIAGNOSIS — R6883 Chills (without fever): Secondary | ICD-10-CM | POA: Insufficient documentation

## 2013-01-11 MED ORDER — ONDANSETRON HCL 4 MG/2ML IJ SOLN
4.0000 mg | Freq: Once | INTRAMUSCULAR | Status: AC
  Administered 2013-01-12: 4 mg via INTRAVENOUS
  Filled 2013-01-11: qty 2

## 2013-01-11 MED ORDER — SODIUM CHLORIDE 0.9 % IV BOLUS (SEPSIS)
1000.0000 mL | Freq: Once | INTRAVENOUS | Status: AC
  Administered 2013-01-12: 1000 mL via INTRAVENOUS

## 2013-01-11 NOTE — ED Notes (Signed)
Pt com[plaint of Nausea and vomiting since 1800 today.  Pt vomited once following EMS arrival, and was medium in amount.  Pt given 4mg  zofran IV in route

## 2013-01-12 LAB — COMPREHENSIVE METABOLIC PANEL
ALT: 12 U/L (ref 0–35)
AST: 24 U/L (ref 0–37)
Chloride: 102 mEq/L (ref 96–112)
GFR calc Af Amer: 76 mL/min — ABNORMAL LOW (ref >90)
Glucose, Bld: 112 mg/dL — ABNORMAL HIGH (ref 70–99)
Potassium: 3.7 mEq/L (ref 3.5–5.1)
Sodium: 139 mEq/L (ref 135–145)
Total Protein: 7.3 g/dL (ref 6.0–8.3)

## 2013-01-12 LAB — POCT I-STAT, CHEM 8
BUN: 15 mg/dL (ref 6–23)
Chloride: 105 mEq/L (ref 96–112)
Glucose, Bld: 104 mg/dL — ABNORMAL HIGH (ref 70–99)
HCT: 37 % (ref 36.0–46.0)
Potassium: 3.6 mEq/L (ref 3.5–5.1)
TCO2: 29 mmol/L (ref 0–100)

## 2013-01-12 LAB — CBC
HCT: 33.9 % — ABNORMAL LOW (ref 36.0–46.0)
Hemoglobin: 12.2 g/dL (ref 12.0–15.0)
MCH: 29.2 pg (ref 26.0–34.0)
MCV: 81.1 fL (ref 78.0–100.0)
RDW: 14.5 % (ref 11.5–15.5)
WBC: 7.9 10*3/uL (ref 4.0–10.5)

## 2013-01-12 LAB — URINE MICROSCOPIC-ADD ON

## 2013-01-12 LAB — URINALYSIS, ROUTINE W REFLEX MICROSCOPIC
Bilirubin Urine: NEGATIVE
Glucose, UA: NEGATIVE mg/dL
Ketones, ur: NEGATIVE mg/dL
Specific Gravity, Urine: 1.01 (ref 1.005–1.030)
pH: 6.5 (ref 5.0–8.0)

## 2013-01-12 MED ORDER — DEXTROSE 5 % IV SOLN
1.0000 g | Freq: Once | INTRAVENOUS | Status: AC
  Administered 2013-01-12: 1 g via INTRAVENOUS
  Filled 2013-01-12: qty 10

## 2013-01-12 MED ORDER — ONDANSETRON HCL 4 MG PO TABS: 4.0000 mg | ORAL_TABLET | Freq: Four times a day (QID) | ORAL | Status: DC

## 2013-01-12 MED ORDER — CEFPODOXIME PROXETIL 100 MG PO TABS: 100.0000 mg | ORAL_TABLET | Freq: Two times a day (BID) | ORAL | Status: DC

## 2013-01-12 NOTE — ED Provider Notes (Signed)
History     CSN: [id number]  Arrival date & time 01/11/13  2324   First MD Initiated Contact with Patient 01/11/13 2337      Chief Complaint  Patient presents with  . Nausea  . Emesis    (Consider location/radiation/quality/duration/timing/severity/associated sxs/prior treatment) Patient is a 67 y.o. female presenting with vomiting.  Emesis Associated symptoms: chills   Associated symptoms: no diarrhea and no headaches    Hx per PT, has had dysuria last few days and worse tonight with an episode of chills followed by N/V tonight.  No fevers. symptoms moderate in severity.  PT worried that she may have a UTI.  Some ongoing lower back pain unchanged.  Mild suprapubic pain but no ABD pain otherwise. No known alleviating factors. PT called 911 and received zofran in route - denies any current nausea.   Past Medical History  Diagnosis Date  . Hypertension     Past Surgical History  Procedure Laterality Date  . Abdominal hysterectomy      No family history on file.  History  Substance Use Topics  . Smoking status: Never Smoker   . Smokeless tobacco: Never Used  . Alcohol Use: Yes    OB History   Grav Para Term Preterm Abortions TAB SAB Ect Mult Living                  Review of Systems  Constitutional: Positive for chills. Negative for fever.  HENT: Negative for neck pain and neck stiffness.   Eyes: Negative for pain.  Respiratory: Negative for shortness of breath.   Cardiovascular: Negative for chest pain.  Gastrointestinal: Positive for vomiting. Negative for diarrhea, constipation and abdominal distention.  Genitourinary: Positive for dysuria.  Musculoskeletal: Negative for back pain.  Skin: Negative for rash.  Neurological: Negative for headaches.  All other systems reviewed and are negative.    Allergies  Review of patient's allergies indicates no known allergies.  Home Medications   Current Outpatient Rx  Name  Route  Sig  Dispense  Refill  .  Acetaminophen-Caffeine 500-65 MG TABS   Oral   Take 1 tablet by mouth every 6 (six) hours as needed (headache).          . Ascorbic Acid (VITAMIN C PO)   Oral   Take 1 tablet by mouth daily.         Marland Kitchen aspirin EC 81 MG tablet   Oral   Take 81 mg by mouth daily.         Marland Kitchen atenolol (TENORMIN) 100 MG tablet   Oral   Take 100 mg by mouth at bedtime.         Marland Kitchen CALCIUM PO   Oral   Take 1 tablet by mouth daily.         . fish oil-omega-3 fatty acids 1000 MG capsule   Oral   Take 2 g by mouth daily.         . IRON PO   Oral   Take 1 tablet by mouth daily.         Marland Kitchen lisinopril-hydrochlorothiazide (PRINZIDE,ZESTORETIC) 20-25 MG per tablet   Oral   Take 1 tablet by mouth daily.         . Menthol-Methyl Salicylate (MUSCLE RUB) 10-15 % CREA   Topical   Apply 1 application topically as needed (for hip and leg pain).         . Multiple Vitamin (MULTIVITAMIN WITH MINERALS) TABS   Oral  Take 1 tablet by mouth daily.         . naproxen sodium ([employer] NAPROXEN SODIUM) 220 MG tablet   Oral   Take 220 mg by mouth 2 (two) times daily as needed (for headache or pain).         . Simethicone 180 MG CAPS   Oral   Take 1-2 capsules by mouth 3 (three) times daily as needed.         . traZODone (DESYREL) 100 MG tablet   Oral   Take 100 mg by mouth at bedtime.         Marland Kitchen zolpidem (AMBIEN) 10 MG tablet   Oral   Take 10 mg by mouth at bedtime as needed for sleep.           BP 153/70  Pulse 116  Temp(Src) 102.9 F (39.4 C)  Resp 18  SpO2 98%  Physical Exam  Constitutional: She is oriented to person, place, and time. She appears well-developed and well-nourished.  HENT:  Head: Normocephalic and atraumatic.  Eyes: EOM are normal. Pupils are equal, round, and reactive to light. No scleral icterus.  Neck: Neck supple.  Cardiovascular: Normal rate, regular rhythm and intact distal pulses.   Pulmonary/Chest: Effort normal and breath sounds normal. No  respiratory distress.  Abdominal: Soft. Bowel sounds are normal. She exhibits no distension. There is no tenderness. There is no rebound and no guarding.  No CVAT  Musculoskeletal: Normal range of motion. She exhibits no edema.  Neurological: She is alert and oriented to person, place, and time.  Skin: Skin is warm and dry.    ED Course  Procedures (including critical care time)  Results for orders placed during the hospital encounter of 01/11/13  CBC      Result Value Range   WBC 7.9  4.0 - 10.5 K/uL   RBC 4.18  3.87 - 5.11 MIL/uL   Hemoglobin 12.2  12.0 - 15.0 g/dL   HCT 56.2 (*) 13.0 - 86.5 %   MCV 81.1  78.0 - 100.0 fL   MCH 29.2  26.0 - 34.0 pg   MCHC 36.0  30.0 - 36.0 g/dL   RDW 78.4  69.6 - 29.5 %   Platelets 126 (*) 150 - 400 K/uL  URINALYSIS, ROUTINE W REFLEX MICROSCOPIC      Result Value Range   Color, Urine YELLOW  YELLOW   APPearance CLOUDY (*) CLEAR   Specific Gravity, Urine 1.010  1.005 - 1.030   pH 6.5  5.0 - 8.0   Glucose, UA NEGATIVE  NEGATIVE mg/dL   Hgb urine dipstick SMALL (*) NEGATIVE   Bilirubin Urine NEGATIVE  NEGATIVE   Ketones, ur NEGATIVE  NEGATIVE mg/dL   Protein, ur NEGATIVE  NEGATIVE mg/dL   Urobilinogen, UA 0.2  0.0 - 1.0 mg/dL   Nitrite NEGATIVE  NEGATIVE   Leukocytes, UA MODERATE (*) NEGATIVE  URINE MICROSCOPIC-ADD ON      Result Value Range   Squamous Epithelial / LPF RARE  RARE   WBC, UA 7-10  <3 WBC/hpf   RBC / HPF 3-6  <3 RBC/hpf   Bacteria, UA FEW (*) RARE  POCT I-STAT, CHEM 8      Result Value Range   Sodium 141  135 - 145 mEq/L   Potassium 3.6  3.5 - 5.1 mEq/L   Chloride 105  96 - 112 mEq/L   BUN 15  6 - 23 mg/dL   Creatinine, Ser 2.84  0.50 - 1.10  mg/dL   Glucose, Bld 191 (*) 70 - 99 mg/dL   Calcium, Ion 4.78 (*) 1.13 - 1.30 mmol/L   TCO2 29  0 - 100 mmol/L   Hemoglobin 12.6  12.0 - 15.0 g/dL   HCT 29.5  62.1 - 30.8 %   IV rocephin   Date: 01/12/2013  Rate: 119  Rhythm: sinus tachycardia  QRS Axis: normal   Intervals: normal  ST/T Wave abnormalities: nonspecific ST changes  Conduction Disutrbances:none  Narrative Interpretation:   Old EKG Reviewed: none available  Recheck: Patient feeling better after IV fluids. Symptomatically improving and requesting to be discharged home. Prescription provided with return precautions verbalizes understood. Plan trial outpatient management.  MDM  Nausea / vomiting with UTI symptoms. Nausea improved in route with Zofran.  Patient evaluated with EKG and labs reviewed as above. Treated for UTI with IV Rocephin, and culture pending.  Serial abdominal exams performed - no acute abdomen, no CVA tenderness or indication for emergent imaging at this time.             Sunnie Nielsen, MD 01/13/13 [phone]

## 2013-01-14 LAB — URINE CULTURE: Colony Count: >=100000

## 2013-01-15 ENCOUNTER — Telehealth (HOSPITAL_COMMUNITY): Payer: Self-pay | Admitting: Emergency Medicine

## 2013-01-15 NOTE — ED Notes (Signed)
Results received from [facility]. (+) [facility].  Rx given in ED for Vantin  -> sensitive to the same.  Chart appended per protocol. 

## 2013-10-18 ENCOUNTER — Emergency Department (HOSPITAL_COMMUNITY)
Admission: EM | Admit: 2013-10-18 | Discharge: 2013-10-18 | Disposition: A | Discharge status: Home / Self Care | Payer: PRIVATE HEALTH INSURANCE | Attending: Emergency Medicine | Admitting: Emergency Medicine

## 2013-10-18 ENCOUNTER — Other Ambulatory Visit: Payer: Self-pay

## 2013-10-18 ENCOUNTER — Encounter (HOSPITAL_COMMUNITY): Payer: Self-pay | Admitting: Emergency Medicine

## 2013-10-18 ENCOUNTER — Emergency Department (HOSPITAL_COMMUNITY): Payer: PRIVATE HEALTH INSURANCE

## 2013-10-18 DIAGNOSIS — R109 Unspecified abdominal pain: Secondary | ICD-10-CM

## 2013-10-18 DIAGNOSIS — R079 Chest pain, unspecified: Secondary | ICD-10-CM

## 2013-10-18 DIAGNOSIS — E66813 Obesity, class 3: Secondary | ICD-10-CM | POA: Diagnosis present

## 2013-10-18 DIAGNOSIS — Z79899 Other long term (current) drug therapy: Secondary | ICD-10-CM | POA: Insufficient documentation

## 2013-10-18 DIAGNOSIS — Z7982 Long term (current) use of aspirin: Secondary | ICD-10-CM | POA: Insufficient documentation

## 2013-10-18 DIAGNOSIS — I10 Essential (primary) hypertension: Secondary | ICD-10-CM | POA: Diagnosis present

## 2013-10-18 DIAGNOSIS — M549 Dorsalgia, unspecified: Secondary | ICD-10-CM | POA: Insufficient documentation

## 2013-10-18 DIAGNOSIS — R10A0 Flank pain, unspecified side: Secondary | ICD-10-CM

## 2013-10-18 DIAGNOSIS — R9431 Abnormal electrocardiogram [ECG] [EKG]: Secondary | ICD-10-CM

## 2013-10-18 DIAGNOSIS — E669 Obesity, unspecified: Secondary | ICD-10-CM | POA: Diagnosis present

## 2013-10-18 DIAGNOSIS — R7989 Other specified abnormal findings of blood chemistry: Secondary | ICD-10-CM

## 2013-10-18 DIAGNOSIS — F319 Bipolar disorder, unspecified: Secondary | ICD-10-CM | POA: Diagnosis present

## 2013-10-18 DIAGNOSIS — E876 Hypokalemia: Secondary | ICD-10-CM | POA: Insufficient documentation

## 2013-10-18 HISTORY — DX: Bipolar disorder, unspecified: F31.9

## 2013-10-18 HISTORY — DX: Obesity, unspecified: E66.9

## 2013-10-18 LAB — CBC WITH DIFFERENTIAL/PLATELET
Basophils Absolute: 0 10*3/uL (ref 0.0–0.1)
Basophils Relative: 0 % (ref 0–1)
HCT: 35.1 % — ABNORMAL LOW (ref 36.0–46.0)
Lymphocytes Relative: 50 % — ABNORMAL HIGH (ref 12–46)
MCHC: 34.8 g/dL (ref 30.0–36.0)
Neutro Abs: 2.5 10*3/uL (ref 1.7–7.7)
Neutrophils Relative %: 43 % (ref 43–77)
Platelets: 147 10*3/uL — ABNORMAL LOW (ref 150–400)
RDW: 14.3 % (ref 11.5–15.5)
WBC: 5.7 10*3/uL (ref 4.0–10.5)

## 2013-10-18 LAB — URINALYSIS, ROUTINE W REFLEX MICROSCOPIC
Glucose, UA: NEGATIVE mg/dL
Hgb urine dipstick: NEGATIVE
Ketones, ur: NEGATIVE mg/dL
Leukocytes, UA: NEGATIVE
Protein, ur: NEGATIVE mg/dL
pH: 6.5 (ref 5.0–8.0)

## 2013-10-18 LAB — COMPREHENSIVE METABOLIC PANEL
ALT: 16 U/L (ref 0–35)
AST: 22 U/L (ref 0–37)
Albumin: 3.8 g/dL (ref 3.5–5.2)
CO2: 22 mEq/L (ref 19–32)
Chloride: 109 mEq/L (ref 96–112)
Creatinine, Ser: 0.81 mg/dL (ref 0.50–1.10)
GFR calc non Af Amer: 75 mL/min — ABNORMAL LOW (ref >90)
Potassium: 2.9 mEq/L — ABNORMAL LOW (ref 3.5–5.1)
Sodium: 147 mEq/L — ABNORMAL HIGH (ref 135–145)
Total Bilirubin: 0.3 mg/dL (ref 0.3–1.2)

## 2013-10-18 LAB — POCT I-STAT TROPONIN I: Troponin i, poc: 0 ng/mL (ref 0.00–0.08)

## 2013-10-18 LAB — RAPID URINE DRUG SCREEN, HOSP PERFORMED
Cocaine: NOT DETECTED
Opiates: NOT DETECTED

## 2013-10-18 LAB — D-DIMER, QUANTITATIVE: D-Dimer, Quant: 0.75 ug/mL-FEU — ABNORMAL HIGH (ref 0.00–0.48)

## 2013-10-18 IMAGING — CT CT ANGIO CHEST
2 of 6 series · 18 of 36 positions shown · IV contrast (omnipaque)
Comparison: Chest radiograph- earlier same day; [DATE]

CLINICAL DATA: Chest heaviness, worse when lying flat, under right
breast and axilla, right flank and epigastric pain, elevated
D-dimer, evaluate for pulmonary embolism

EXAM:
CT ANGIOGRAPHY CHEST WITH CONTRAST
TECHNIQUE: Multidetector CT imaging of the chest was performed using the
standard protocol during bolus administration of intravenous
contrast. Multiplanar CT image reconstructions including MIPs were
obtained to evaluate the vascular anatomy.
CONTRAST:  70 mL OMNIPAQUE IOHEXOL 350 MG/ML SOLN

[Series 6: pe thins · axial · 0.61mm/px · z∈[-371,-74]mm · 17 of 335 slices shown]
[im 19/335  lung]
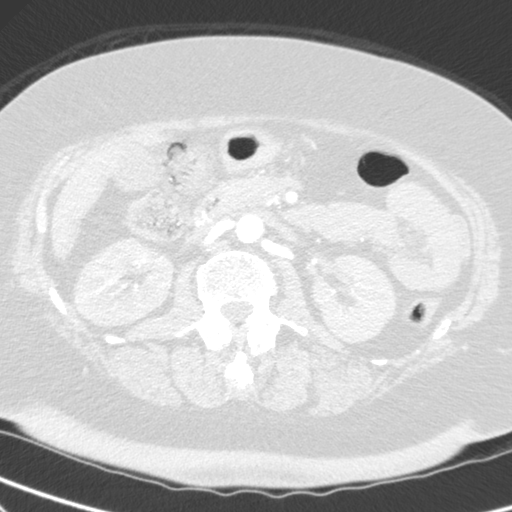
[im 38/335  mediastinal]
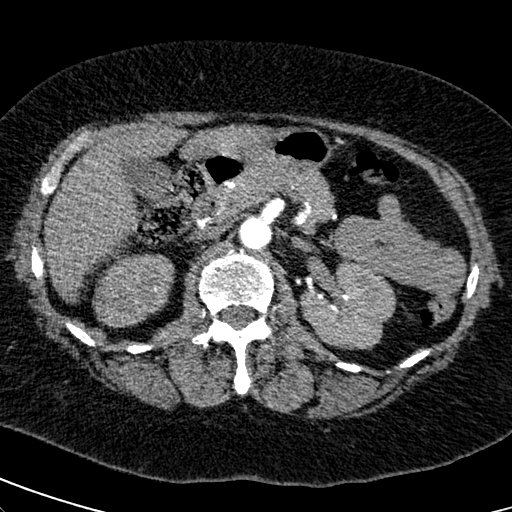
[im 56/335  lung]
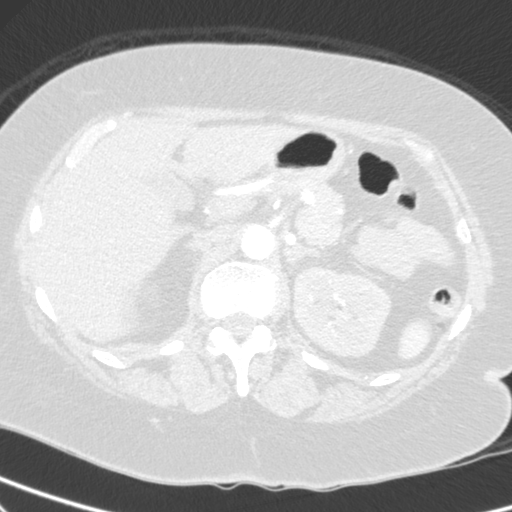
[im 75/335  mediastinal]
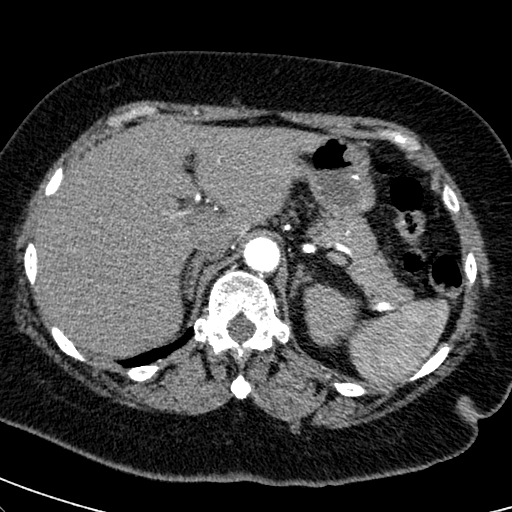
[im 93/335  lung]
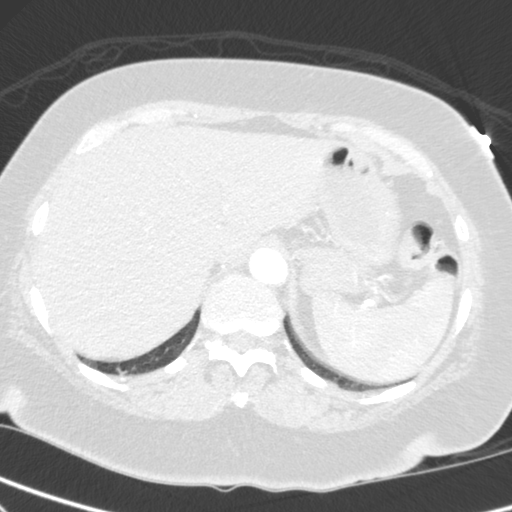
[im 112/335  mediastinal]
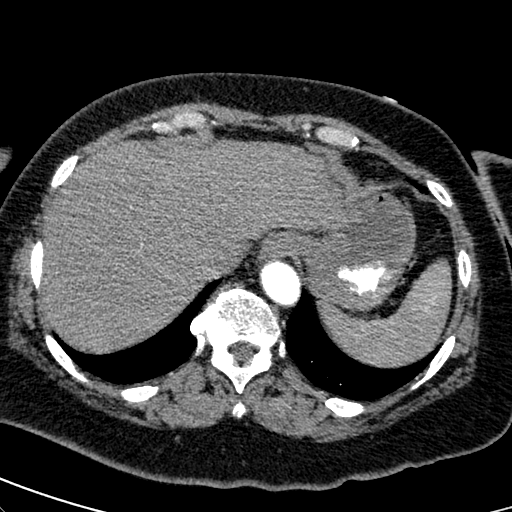
[im 130/335  lung]
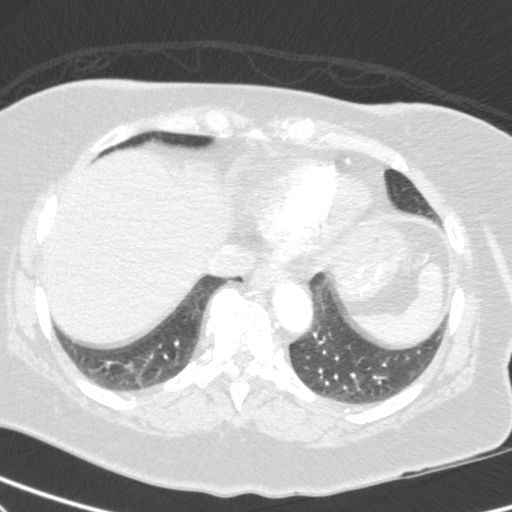
[im 149/335  mediastinal]
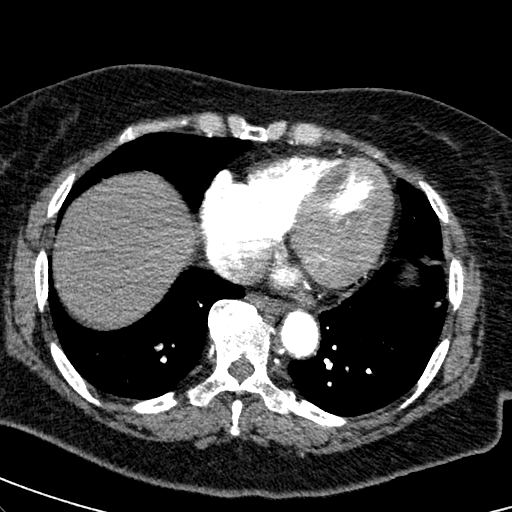
[im 168/335  lung]
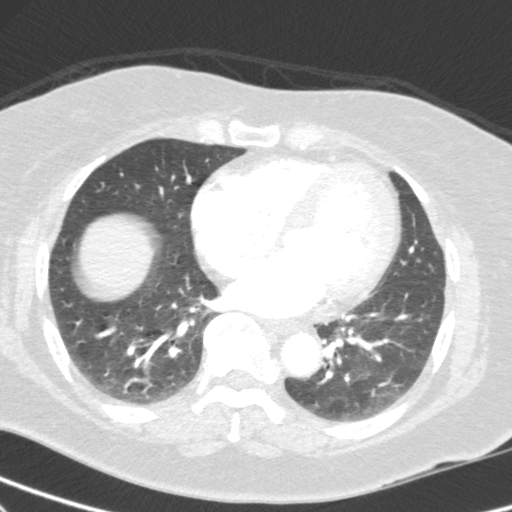
[im 186/335  mediastinal]
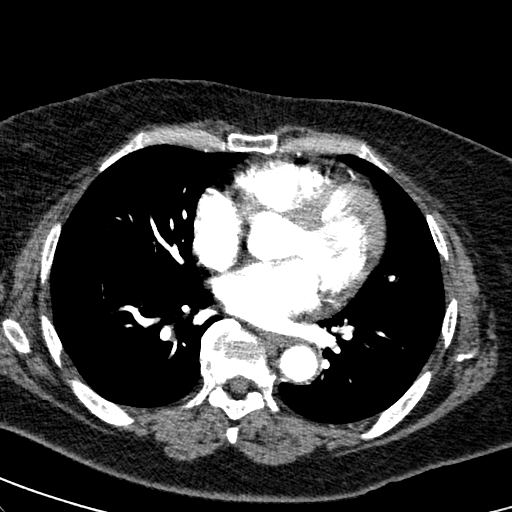
[im 205/335  lung]
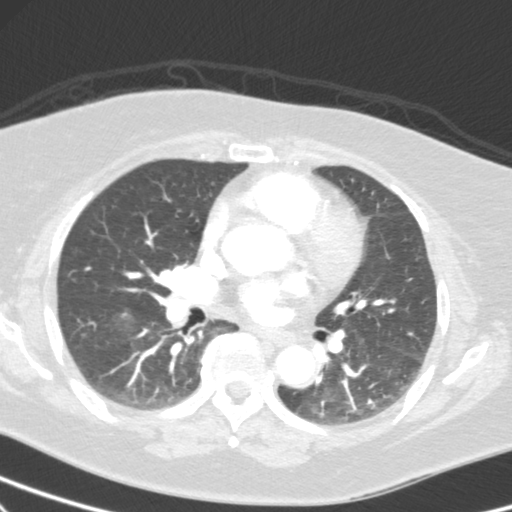
[im 223/335  mediastinal]
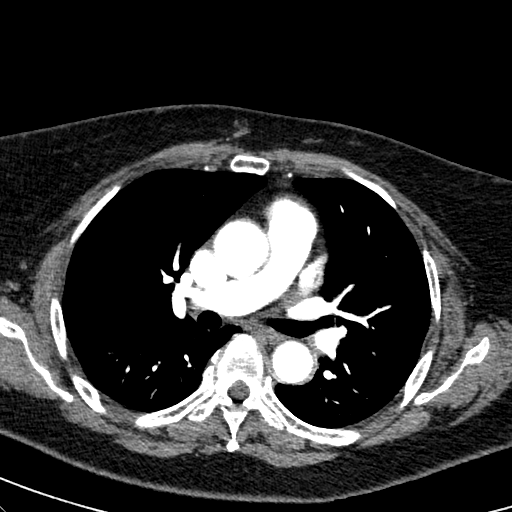
[im 242/335  lung]
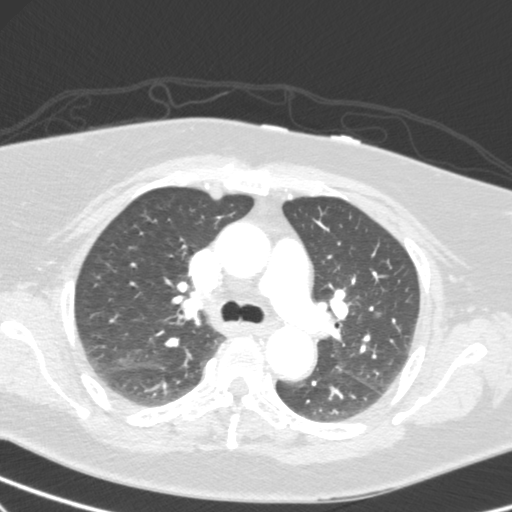
[im 260/335  mediastinal]
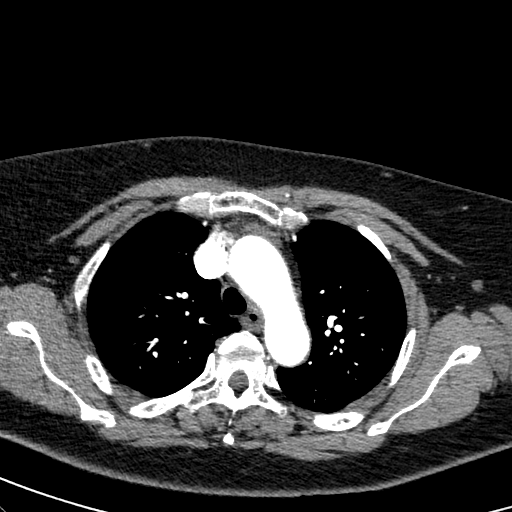
[im 279/335  lung]
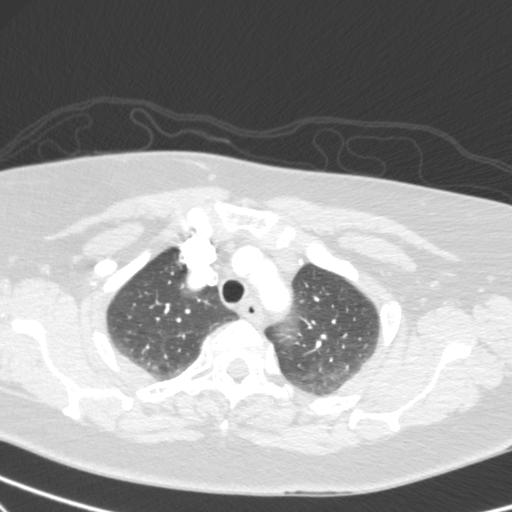
[im 297/335  mediastinal]
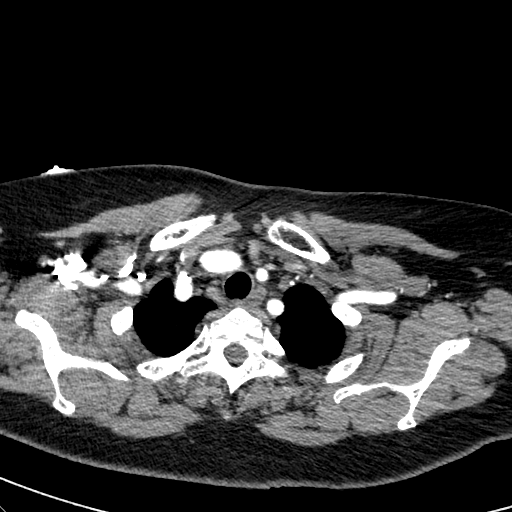
[im 316/335  lung]
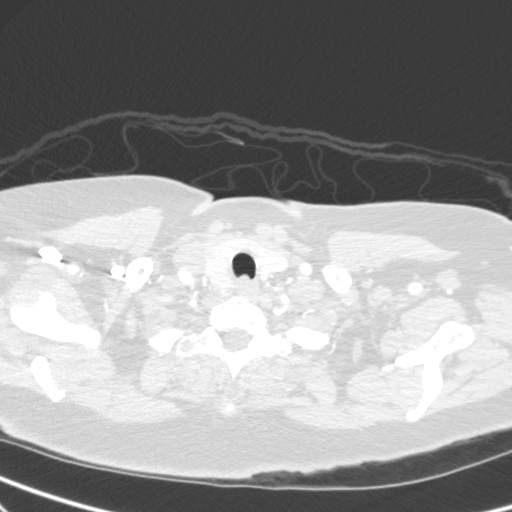

[mpr, coronals, coronal · coronal · 0.65mm/px · 1 of 107 slices shown]
[im 54/107  mediastinal]
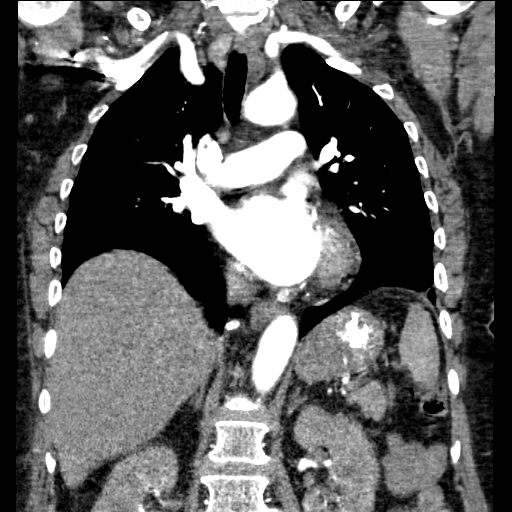

[18 of 36 positions shown; findings below may reference images not displayed]

FINDINGS: Vascular Findings:

There is adequate opacification of the pulmonary arterial system
with the main pulmonary artery measuring 491 Hounsfield units. There
discrete filling defects within the pulmonary arterial tree to
suggest pulmonary embolism. Normal caliber of the main pulmonary
artery.

Borderline cardiomegaly. No pericardial effusion. Normal caliber of
the thoracic aorta. No definite thoracic aortic dissection or
periaortic stranding. Dementia configuration of the aortic arch. The
branch vessels of the aortic arch are widely patent throughout their
imaged course.

Review of the MIP images confirms the above findings.

----------------------------------------------------------------------------------

Nonvascular Findings:

There is minimal dependent and subpleural ground-glass atelectasis.
There is minimal subsegmental atelectasis within in the anterior
mesial basilar segments of the left lower lobe and within the left
costophrenic angle. No discrete focal airspace opacities. No air
bronchograms. No pleural effusion or pneumothorax. The central
pulmonary airways are widely patent. No discrete pulmonary nodules.

Scattered shoddy mediastinal and right hilar lymph nodes are not
enlarged by size criteria with index pretracheal lymph node
measuring 0.7 cm in greatest short axis diameter (image 45, series
5) and index right suprahilar nodal conglomeration measuring
approximately 0.8 cm (image 55) and presumably reactive in etiology.
No definite mediastinal, hilar or axillary lymphadenopathy.

Early arterial phase evaluation of the upper abdomen is normal.
Incidental note is made of a small splenule.

Regional soft tissues are normal.

There is mild diffuse heterogeneity of the thyroid parenchyma with
an apparent approximately 2.0 x 1.5 cm partially exophytic nodule
arising from the posterior inferior aspect of the right lobe of the
thyroid (image 14, series 5).

No acute or aggressive osseous abnormalities. Stigmata of DISH
within the mid and caudal aspect of the thoracic spine.
IMPRESSION: 1. No acute cardiopulmonary disease. Specifically, no evidence of
pulmonary embolism.
2. Indeterminate partially exophytic approximately 2 cm nodule
arising from the posterior inferior aspect of the right lobe of the
thyroid. Further evaluation with nonemergent thyroid ultrasound
could be performed as clinically indicated.

## 2013-10-18 IMAGING — CR DG CHEST 2V
2 series · 2 of 2 positions shown · non-contrast
Comparison: Chest radiograph [DATE]

CLINICAL DATA: Right flank pain with nausea and emesis for 1 week.

EXAM:
CHEST  2 VIEW

[w chest lat]
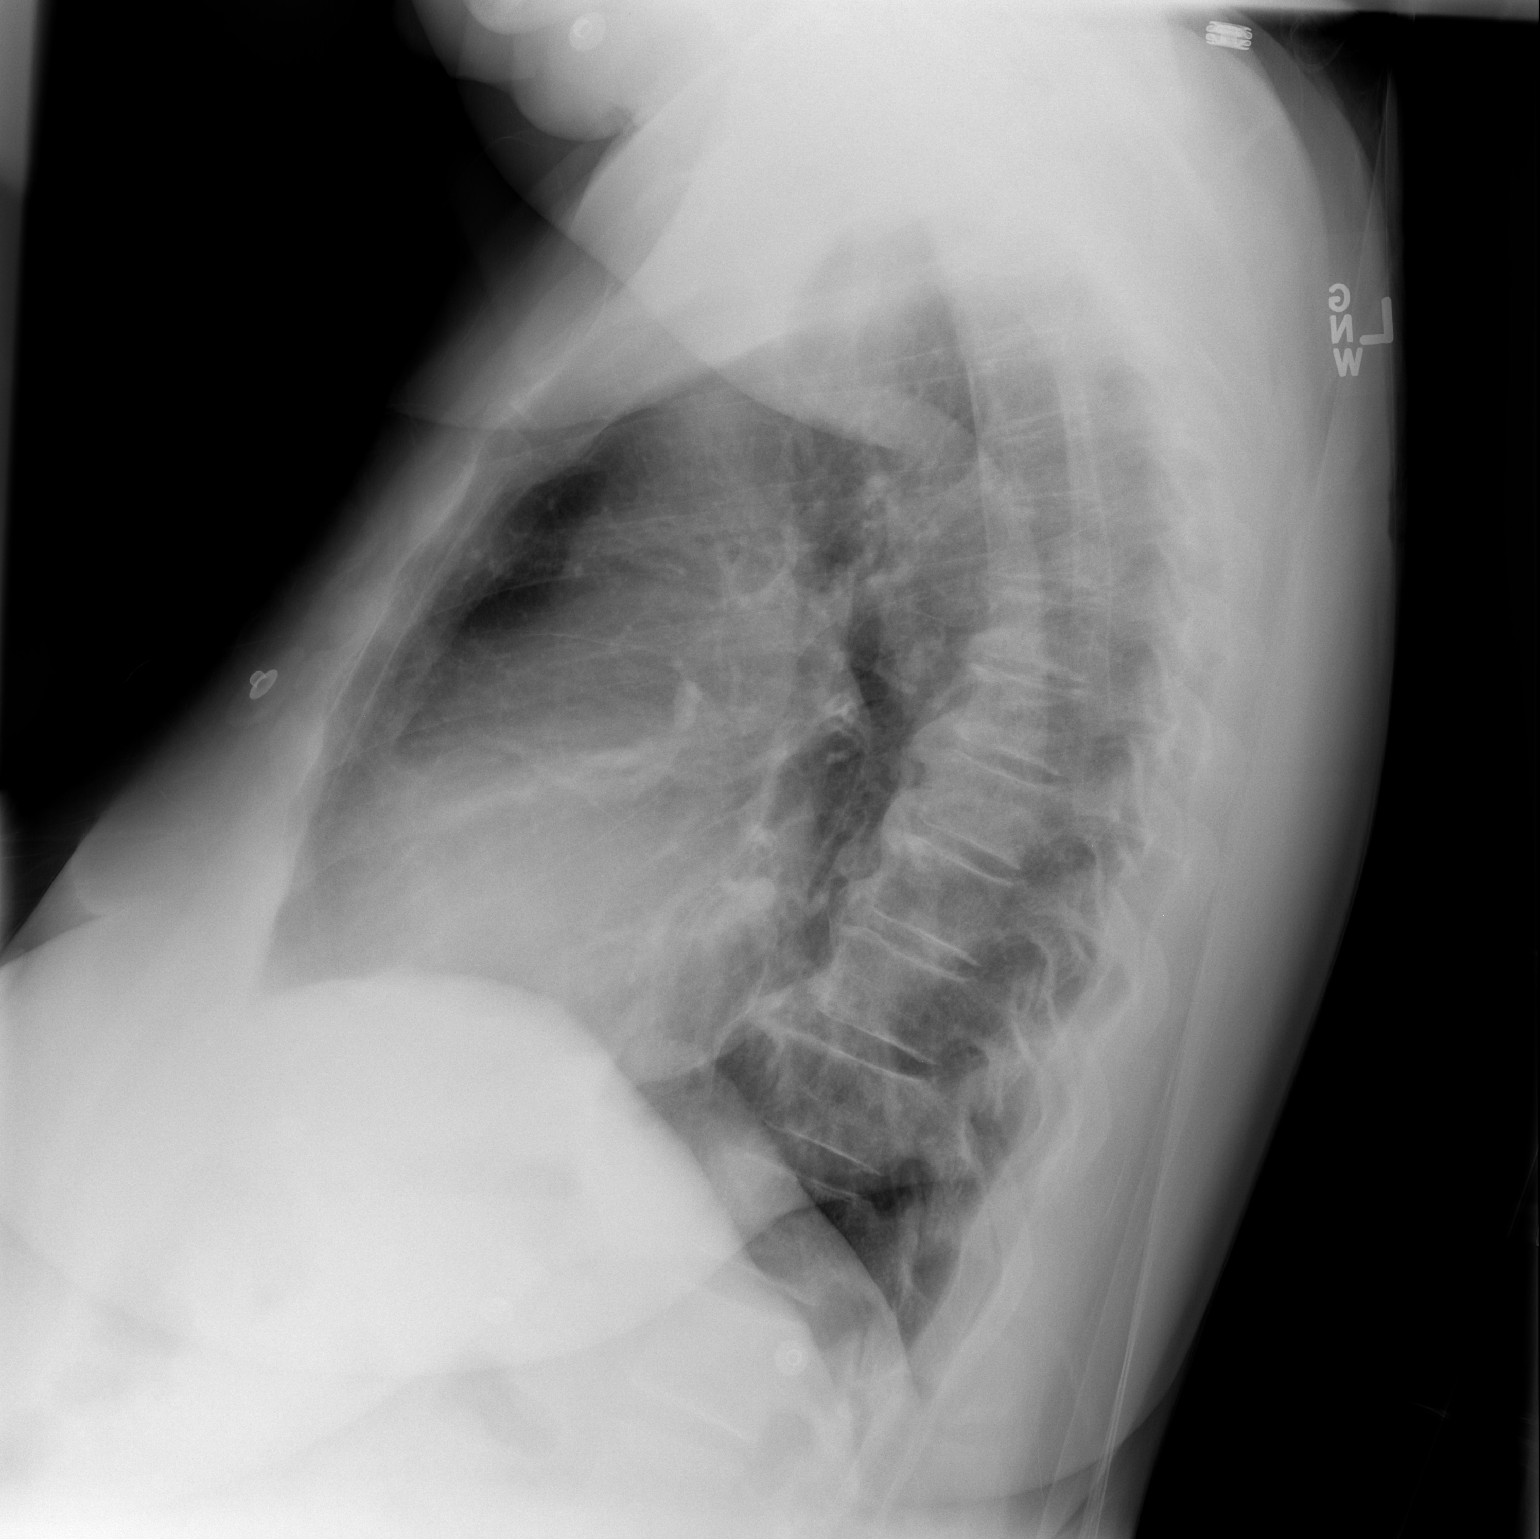

[view not recorded]
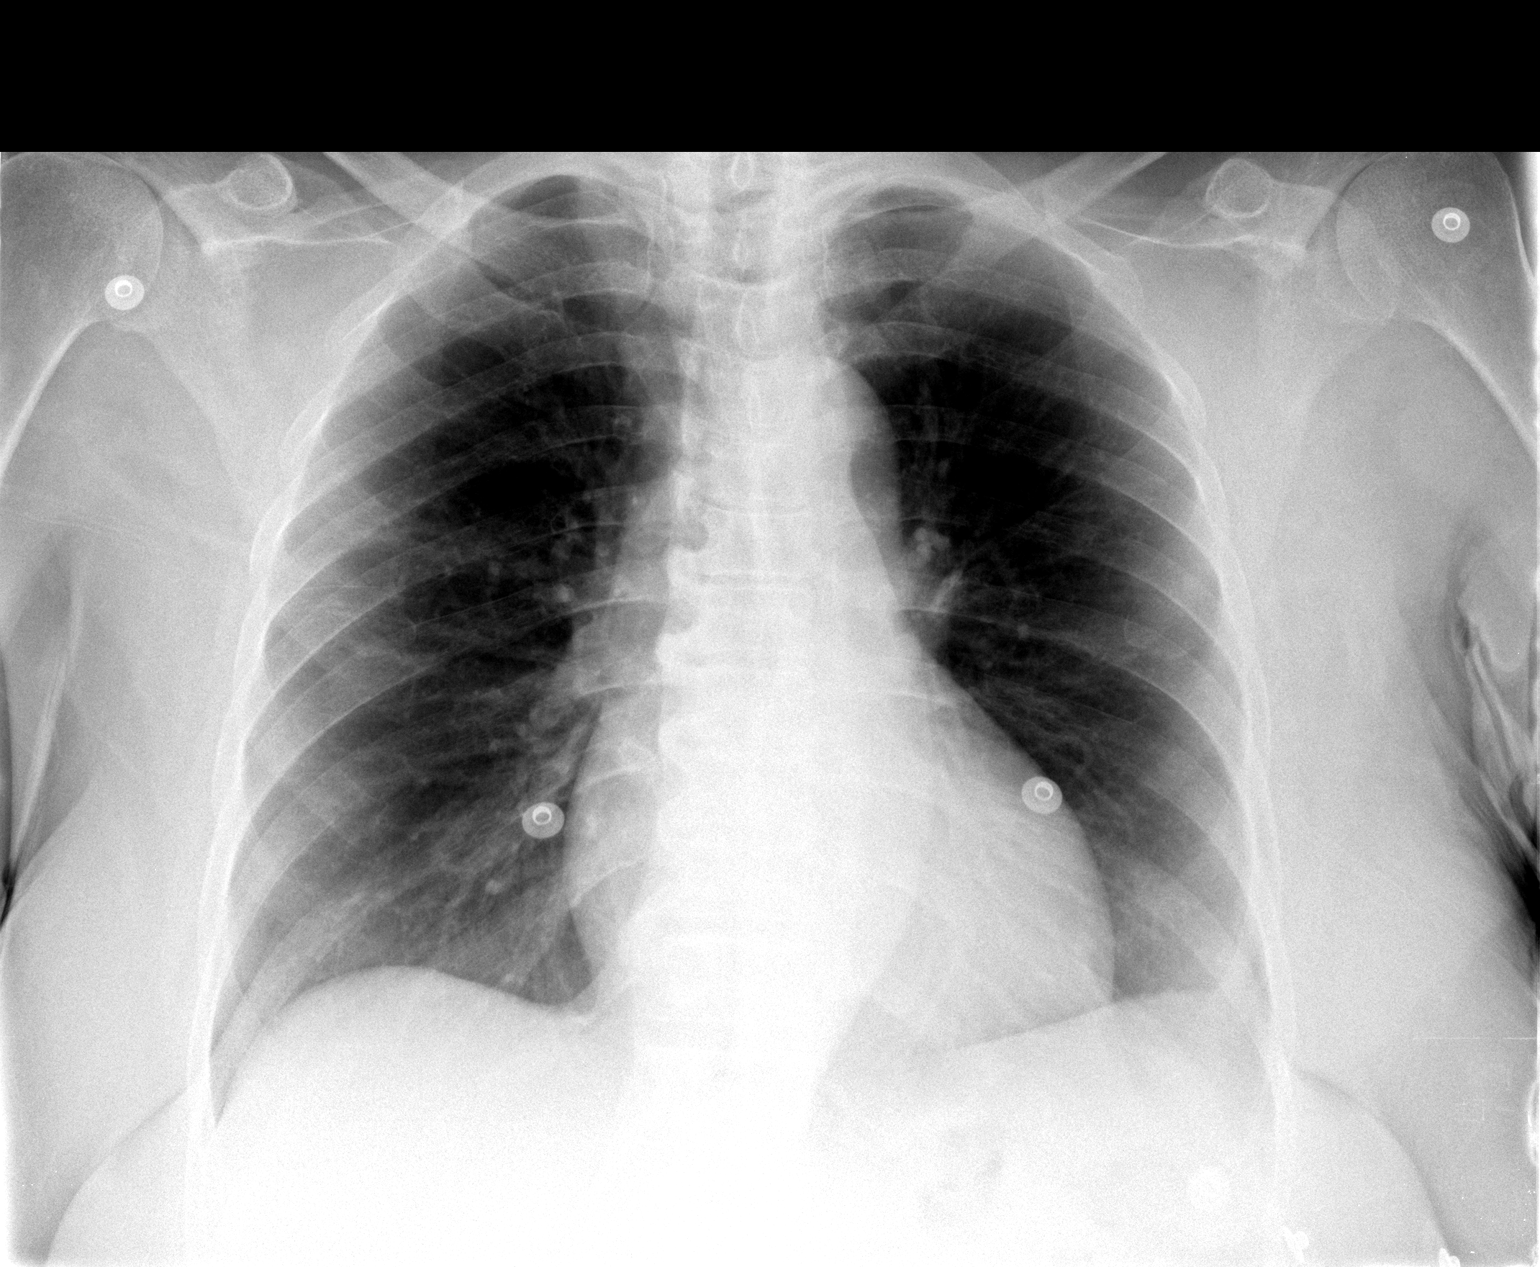

[2 of 2 positions shown; findings below may reference images not displayed]

FINDINGS: Cardiomediastinal silhouette is unremarkable. The lungs are clear
without pleural effusions or focal consolidations. Pulmonary
vasculature is unremarkable. Trachea projects midline and there is
no pneumothorax. Soft tissue planes and included osseous structures
are nonsuspicious. Mild degenerative change of the thoracic spine.
IMPRESSION: No active cardiopulmonary disease.

  By: KOBOI

## 2013-10-18 MED ORDER — HYDROCODONE-ACETAMINOPHEN 5-325 MG PO TABS: 1.0000 | ORAL_TABLET | ORAL | Status: DC | PRN

## 2013-10-18 MED ORDER — TRAMADOL HCL 50 MG PO TABS
50.0000 mg | ORAL_TABLET | Freq: Once | ORAL | Status: AC
  Administered 2013-10-18: 50 mg via ORAL
  Filled 2013-10-18: qty 1

## 2013-10-18 MED ORDER — POTASSIUM CHLORIDE CRYS ER 20 MEQ PO TBCR
40.0000 meq | EXTENDED_RELEASE_TABLET | Freq: Once | ORAL | Status: AC
  Administered 2013-10-18: 40 meq via ORAL
  Filled 2013-10-18: qty 2

## 2013-10-18 MED ORDER — SODIUM CHLORIDE 0.9 % IV BOLUS (SEPSIS)
1000.0000 mL | Freq: Once | INTRAVENOUS | Status: AC
  Administered 2013-10-18: 1000 mL via INTRAVENOUS

## 2013-10-18 MED ORDER — IOHEXOL 350 MG/ML SOLN
70.0000 mL | Freq: Once | INTRAVENOUS | Status: AC | PRN
  Administered 2013-10-18: 70 mL via INTRAVENOUS
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
## 2013-10-18 NOTE — ED Notes (Signed)
Per EMS: Patient here for multiple complaints, but most notable complaints is flank pain. Pt v/s are stable. NSR in route. Pt also c/o nausea and vomiting for several days. Ax4, NAD.

## 2013-10-18 NOTE — ED Provider Notes (Addendum)
CSN: [id number]     Arrival date & time 10/18/13  0456 History   First MD Initiated Contact with Patient 10/18/13 [phone]     Chief Complaint  Patient presents with  . Flank Pain   (Consider location/radiation/quality/duration/timing/severity/associated sxs/prior Treatment) HPI Patient is a middle aged woman with a history of HTN. She presents with 1 week of persistent right upper chest pain. Nothing exacerbates or relieves her sx. However, she says a few minutes later than it hurts to lay in the right lateral decubitus position. She denies GU sx. She has been nauseated. She denies vomiting and diarrhea. No fever.   Pain is aching, constant, severe.   Past Medical History  Diagnosis Date  . Hypertension    Past Surgical History  Procedure Laterality Date  . Abdominal hysterectomy     No family history on file. History  Substance Use Topics  . Smoking status: Never Smoker   . Smokeless tobacco: Never Used  . Alcohol Use: Yes   OB History   Grav Para Term Preterm Abortions TAB SAB Ect Mult Living                 Review of Systems Ten point review of symptoms performed and is negative with the exception of symptoms noted above.   Allergies  Review of patient's allergies indicates no known allergies.  Home Medications   Current Outpatient Rx  Name  Route  Sig  Dispense  Refill  . Acetaminophen-Caffeine 500-65 MG TABS   Oral   Take 1 tablet by mouth every 6 (six) hours as needed (headache).          . Ascorbic Acid (VITAMIN C PO)   Oral   Take 1 tablet by mouth daily.         Marland Kitchen aspirin EC 81 MG tablet   Oral   Take 81 mg by mouth daily.         Marland Kitchen atenolol (TENORMIN) 100 MG tablet   Oral   Take 100 mg by mouth at bedtime.         Marland Kitchen CALCIUM PO   Oral   Take 1 tablet by mouth daily.         . cefpodoxime (VANTIN) 100 MG tablet   Oral   Take 1 tablet (100 mg total) by mouth 2 (two) times daily.   14 tablet   0   . fish oil-omega-3 fatty acids 1000  MG capsule   Oral   Take 2 g by mouth daily.         . IRON PO   Oral   Take 1 tablet by mouth daily.         Marland Kitchen lisinopril-hydrochlorothiazide (PRINZIDE,ZESTORETIC) 20-25 MG per tablet   Oral   Take 1 tablet by mouth daily.         . Menthol-Methyl Salicylate (MUSCLE RUB) 10-15 % CREA   Topical   Apply 1 application topically as needed (for hip and leg pain).         . Multiple Vitamin (MULTIVITAMIN WITH MINERALS) TABS   Oral   Take 1 tablet by mouth daily.         . naproxen sodium ([employer] NAPROXEN SODIUM) 220 MG tablet   Oral   Take 220 mg by mouth 2 (two) times daily as needed (for headache or pain).         . ondansetron (ZOFRAN) 4 MG tablet   Oral   Take 1 tablet (4 mg  total) by mouth every 6 (six) hours.   12 tablet   0   . Simethicone 180 MG CAPS   Oral   Take 1-2 capsules by mouth 3 (three) times daily as needed.         . traZODone (DESYREL) 100 MG tablet   Oral   Take 100 mg by mouth at bedtime.         Marland Kitchen zolpidem (AMBIEN) 10 MG tablet   Oral   Take 10 mg by mouth at bedtime as needed for sleep.          There were no vitals taken for this visit. Physical Exam Gen: well developed and well nourished appearing Head: NCAT Eyes: PERL, EOMI Nose: no epistaixis or rhinorrhea Mouth/throat: mucosa is moist and pink Neck: supple, no stridor Lungs: CTA B, no wheezing, rhonchi or rales CV: RRR, no murmur, extremities appear well perfused.  Abd: soft, notender, nondistended Back: normal to inspection, there is ttp over the right upper to mid back region, no crepitus,  no midline ttp, no cva ttp Skin: warm and dry Ext: normal to inspection, no dependent edema Neuro: CN ii-xii grossly intact, no focal deficits Psyche; normal affect,  calm and cooperative.   ED Course  Procedures (including critical care time) Labs Review Results for orders placed during the hospital encounter of 10/18/13 (from the past 24 hour(s))  CBC WITH DIFFERENTIAL      Status: Abnormal   Collection Time    10/18/13  5:20 AM      Result Value Range   WBC 5.7  4.0 - 10.5 K/uL   RBC 4.16  3.87 - 5.11 MIL/uL   Hemoglobin 12.2  12.0 - 15.0 g/dL   HCT 16.1 (*) 09.6 - 04.5 %   MCV 84.4  78.0 - 100.0 fL   MCH 29.3  26.0 - 34.0 pg   MCHC 34.8  30.0 - 36.0 g/dL   RDW 40.9  81.1 - 91.4 %   Platelets 147 (*) 150 - 400 K/uL   Neutrophils Relative % 43  43 - 77 %   Neutro Abs 2.5  1.7 - 7.7 K/uL   Lymphocytes Relative 50 (*) 12 - 46 %   Lymphs Abs 2.9  0.7 - 4.0 K/uL   Monocytes Relative 5  3 - 12 %   Monocytes Absolute 0.3  0.1 - 1.0 K/uL   Eosinophils Relative 2  0 - 5 %   Eosinophils Absolute 0.1  0.0 - 0.7 K/uL   Basophils Relative 0  0 - 1 %   Basophils Absolute 0.0  0.0 - 0.1 K/uL  COMPREHENSIVE METABOLIC PANEL     Status: Abnormal   Collection Time    10/18/13  5:20 AM      Result Value Range   Sodium 147 (*) 135 - 145 mEq/L   Potassium 2.9 (*) 3.5 - 5.1 mEq/L   Chloride 109  96 - 112 mEq/L   CO2 22  19 - 32 mEq/L   Glucose, Bld 91  70 - 99 mg/dL   BUN 10  6 - 23 mg/dL   Creatinine, Ser 7.82  0.50 - 1.10 mg/dL   Calcium 9.9  8.4 - 95.6 mg/dL   Total Protein 7.3  6.0 - 8.3 g/dL   Albumin 3.8  3.5 - 5.2 g/dL   AST 22  0 - 37 U/L   ALT 16  0 - 35 U/L   Alkaline Phosphatase 131 (*) 39 - 117 U/L  Total Bilirubin 0.3  0.3 - 1.2 mg/dL   GFR calc non Af Amer 75 (*) >90 mL/min   GFR calc Af Amer 86 (*) >90 mL/min  LIPASE, BLOOD     Status: None   Collection Time    10/18/13  5:20 AM      Result Value Range   Lipase 30  11 - 59 U/L  POCT I-STAT TROPONIN I     Status: None   Collection Time    10/18/13  5:22 AM      Result Value Range   Troponin i, poc 0.00  0.00 - 0.08 ng/mL   Comment 3            DG Chest 2 View (Final result)  Result time: 10/18/13 05:37:44    Final result by Rad Results In Interface (10/18/13 05:37:44)    Narrative:   CLINICAL DATA: Right flank pain with nausea and emesis for 1 week.  EXAM: CHEST 2  VIEW  COMPARISON: Chest radiograph April 04, 2011  FINDINGS: Cardiomediastinal silhouette is unremarkable. The lungs are clear without pleural effusions or focal consolidations. Pulmonary vasculature is unremarkable. Trachea projects midline and there is no pneumothorax. Soft tissue planes and included osseous structures are nonsuspicious. Mild degenerative change of the thoracic spine.  IMPRESSION: No active cardiopulmonary disease.   Electronically Signed By: Awilda Metro On: 10/18/2013 05:37        EKG: NSR, rate 78 bpm, LVH, normal intervals, new T wave inversions in inferior leads, mild ST depression in lateral leads.  These findings are new when compared to comparison EKG from 01/12/13.   MDM   ED work up is notable for normal CXR, troponin and CBC. CMP notable for mildly elevated sodium level and K of 2.9 which has been orally repleted.  The patient's acute onset of right upper back pain is concerning for possible PE in the setting of positive d-dimer and exclusion of pathologic findings on CXR. We will obtain a CTA chest to rule out.   The patient has some new ST depression on her EKG. I have asked to repeat this. She has no chest pain and her troponin is wnl. If repeat EKG shows same findings, we will consult cardiology for their recommendations.    0730:  Repeat EKG shows no ST depression in the laterals leads and but has persistent non specific finding of T wave inversion in inferior leads. We will trend troponin. Do not feel cardiology consultation is indicated given reassuring repeat EKG without any intervention.   Brandt Loosen, MD 10/18/13 4540  9811:  Case discussed with Bjorn Loser, MLP with the cardiology service. Cardiology will consult in the ED. UDS and delta troponin pending.   Brandt Loosen, MD 10/18/13 [phone]

## 2013-10-18 NOTE — ED Notes (Signed)
Patient came in with right flank pain and epigastic pain , pt having the pain the last several weeks
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
## 2013-10-18 NOTE — ED Notes (Signed)
Pt reports admits to drinking at least 5 drinks yesterday and tonight.

## 2013-10-18 NOTE — ED Notes (Signed)
Pt given cup of water to drink.  No other needs. Denies pain at this time.

## 2013-10-18 NOTE — ED Notes (Signed)
Pt returned from radiology.

## 2013-10-18 NOTE — ED Notes (Signed)
Patient transported to X-ray 

## 2013-12-08 ENCOUNTER — Encounter (HOSPITAL_COMMUNITY): Payer: Self-pay | Admitting: Emergency Medicine

## 2013-12-08 ENCOUNTER — Emergency Department (HOSPITAL_COMMUNITY)
Admission: EM | Admit: 2013-12-08 | Discharge: 2013-12-08 | Disposition: A | Discharge status: Home / Self Care | Payer: Medicare Other | Attending: Emergency Medicine | Admitting: Emergency Medicine

## 2013-12-08 DIAGNOSIS — Z8659 Personal history of other mental and behavioral disorders: Secondary | ICD-10-CM | POA: Insufficient documentation

## 2013-12-08 DIAGNOSIS — R6883 Chills (without fever): Secondary | ICD-10-CM | POA: Insufficient documentation

## 2013-12-08 DIAGNOSIS — N3091 Cystitis, unspecified with hematuria: Secondary | ICD-10-CM

## 2013-12-08 DIAGNOSIS — Z7982 Long term (current) use of aspirin: Secondary | ICD-10-CM | POA: Insufficient documentation

## 2013-12-08 DIAGNOSIS — I10 Essential (primary) hypertension: Secondary | ICD-10-CM | POA: Insufficient documentation

## 2013-12-08 DIAGNOSIS — Z79899 Other long term (current) drug therapy: Secondary | ICD-10-CM | POA: Insufficient documentation

## 2013-12-08 DIAGNOSIS — N3090 Cystitis, unspecified without hematuria: Secondary | ICD-10-CM | POA: Insufficient documentation

## 2013-12-08 DIAGNOSIS — E669 Obesity, unspecified: Secondary | ICD-10-CM | POA: Insufficient documentation

## 2013-12-08 DIAGNOSIS — R109 Unspecified abdominal pain: Secondary | ICD-10-CM | POA: Insufficient documentation

## 2013-12-08 LAB — URINALYSIS, ROUTINE W REFLEX MICROSCOPIC
BILIRUBIN URINE: NEGATIVE
GLUCOSE, UA: NEGATIVE mg/dL
Ketones, ur: NEGATIVE mg/dL
Nitrite: NEGATIVE
Protein, ur: NEGATIVE mg/dL
SPECIFIC GRAVITY, URINE: 1.004 — AB (ref 1.005–1.030)
UROBILINOGEN UA: 0.2 mg/dL (ref 0.0–1.0)
pH: 6 (ref 5.0–8.0)

## 2013-12-08 LAB — URINE MICROSCOPIC-ADD ON

## 2013-12-08 MED ORDER — CEPHALEXIN 500 MG PO CAPS: 500.0000 mg | ORAL_CAPSULE | Freq: Once | ORAL | Status: DC

## 2013-12-08 MED ORDER — CEPHALEXIN 500 MG PO CAPS: 500.0000 mg | ORAL_CAPSULE | Freq: Two times a day (BID) | ORAL | Status: DC

## 2013-12-08 NOTE — ED Notes (Signed)
Bed: [id number] Expected date: 12/08/13 Expected time: 12:34 AM Means of arrival: Ambulance Comments: 68 yo F  Hematuria/ETOH

## 2013-12-08 NOTE — Discharge Instructions (Signed)
Urinary Tract Infection  Urinary tract infections (UTIs) can develop anywhere along your urinary tract. Your urinary tract is your body's drainage system for removing wastes and extra water. Your urinary tract includes two kidneys, two ureters, a bladder, and a urethra. Your kidneys are a pair of bean-shaped organs. Each kidney is about the size of your fist. They are located below your ribs, one on each side of your spine.  CAUSES  Infections are caused by microbes, which are microscopic organisms, including fungi, viruses, and bacteria. These organisms are so small that they can only be seen through a microscope. Bacteria are the microbes that most commonly cause UTIs.  SYMPTOMS   Symptoms of UTIs may vary by age and gender of the patient and by the location of the infection. Symptoms in young women typically include a frequent and intense urge to urinate and a painful, burning feeling in the bladder or urethra during urination. Older women and men are more likely to be tired, shaky, and weak and have muscle aches and abdominal pain. A fever may mean the infection is in your kidneys. Other symptoms of a kidney infection include pain in your back or sides below the ribs, nausea, and vomiting.  DIAGNOSIS  To diagnose a UTI, your caregiver will ask you about your symptoms. Your caregiver also will ask to provide a urine sample. The urine sample will be tested for bacteria and white blood cells. White blood cells are made by your body to help fight infection.  TREATMENT   Typically, UTIs can be treated with medication. Because most UTIs are caused by a bacterial infection, they usually can be treated with the use of antibiotics. The choice of antibiotic and length of treatment depend on your symptoms and the type of bacteria causing your infection.  HOME CARE INSTRUCTIONS   If you were prescribed antibiotics, take them exactly as your caregiver instructs you. Finish the medication even if you feel better after you  have only taken some of the medication.   Drink enough water and fluids to keep your urine clear or pale yellow.   Avoid caffeine, tea, and carbonated beverages. They tend to irritate your bladder.   Empty your bladder often. Avoid holding urine for long periods of time.   Empty your bladder before and after sexual intercourse.   After a bowel movement, women should cleanse from front to back. Use each tissue only once.  SEEK MEDICAL CARE IF:    You have back pain.   You develop a fever.   Your symptoms do not begin to resolve within 3 days.  SEEK IMMEDIATE MEDICAL CARE IF:    You have severe back pain or lower abdominal pain.   You develop chills.   You have nausea or vomiting.   You have continued burning or discomfort with urination.  MAKE SURE YOU:    Understand these instructions.   Will watch your condition.   Will get help right away if you are not doing well or get worse.  Document Released: 07/20/2005 Document Revised: 04/10/2012 Document Reviewed: 11/18/2011  [employer] Patient Information 2014 [employer].

## 2013-12-08 NOTE — ED Provider Notes (Signed)
Medical screening examination/treatment/procedure(s) were performed by non-physician practitioner and as supervising physician I was immediately available for consultation/collaboration.  EKG Interpretation   None         Taj Nevins, MD 12/08/13 0618 

## 2013-12-08 NOTE — ED Notes (Signed)
Patient arrive from home via EMS. Patient complains of pain on urination and bloody urine. Patient reported to EMS that she has been taking antibiotics for UTI, however they were unable to locate medication in home. Patient reports drinking 1 liter of vodka this evening.

## 2013-12-08 NOTE — ED Provider Notes (Signed)
CSN: [id number]     Arrival date & time 12/08/13  0047 History   First MD Initiated Contact with Patient 12/08/13 0123     Chief Complaint  Patient presents with  . Hematuria  . Dysuria    (Consider location/radiation/quality/duration/timing/severity/associated sxs/prior Treatment) HPI Comments: 68 year old female with a history of hypertension and bipolar 1 disorder presents to the emergency department for dysuria. Patient states the dysuria has been gradually worsening over the last 5 days. She denies any modifying factors of her symptoms. She states the symptoms became associated with hematuria today. She also endorses urgency and increased urinary frequency. Patient states that symptoms feel similar to when she has had urinary tract infections in the past. Patient is intoxicated today. She reports drinking 1 L of vodka this evening. She states that she went to "2 parties". She is A&Ox4; speech is goal oriented.  Patient is a 68 y.o. female presenting with hematuria and dysuria. The history is provided by the patient. No language interpreter was used.  Hematuria Associated symptoms include abdominal pain and chills. Pertinent negatives include no fever, nausea or vomiting.  Dysuria Associated symptoms: abdominal pain   Associated symptoms: no fever, no nausea, no vaginal discharge and no vomiting     Past Medical History  Diagnosis Date  . Hypertension   . Bipolar 1 disorder   . Obesity    Past Surgical History  Procedure Laterality Date  . Abdominal hysterectomy  ~2002   Family History  Problem Relation Age of Onset  . Kidney disease Mother   . Hypertension Mother   . Kidney disease Brother    History  Substance Use Topics  . Smoking status: Never Smoker   . Smokeless tobacco: Never Used  . Alcohol Use: 0.0 oz/week   OB History   Grav Para Term Preterm Abortions TAB SAB Ect Mult Living                 Review of Systems  Constitutional: Positive for chills. Negative  for fever.  Gastrointestinal: Positive for abdominal pain. Negative for nausea and vomiting.  Genitourinary: Positive for dysuria, urgency, frequency and hematuria. Negative for vaginal bleeding, vaginal discharge and pelvic pain.  All other systems reviewed and are negative.    Allergies  Review of patient's allergies indicates no known allergies.  Home Medications   Current Outpatient Rx  Name  Route  Sig  Dispense  Refill  . Ascorbic Acid (VITAMIN C PO)   Oral   Take 1 tablet by mouth daily.         Marland Kitchen. aspirin EC 81 MG tablet   Oral   Take 81 mg by mouth daily.         Marland Kitchen. CALCIUM PO   Oral   Take 1 tablet by mouth daily.         . fish oil-omega-3 fatty acids 1000 MG capsule   Oral   Take 2 g by mouth daily.         Marland Kitchen. HYDROcodone-acetaminophen (NORCO/VICODIN) 5-325 MG per tablet   Oral   Take 1 tablet by mouth every 4 (four) hours as needed.   15 tablet   0   . Multiple Vitamin (MULTIVITAMIN WITH MINERALS) TABS   Oral   Take 1 tablet by mouth daily.         Marland Kitchen. VITAMIN A PO   Oral   Take 1 tablet by mouth daily.          BP 151/91  Pulse 91  Temp(Src) 98.9 F (37.2 C) (Oral)  Resp 19  Ht 5\' 6"  (1.676 m)  Wt 215 lb (97.523 kg)  BMI 34.72 kg/m2  SpO2 99%  Physical Exam  Nursing note and vitals reviewed. Constitutional: She is oriented to person, place, and time. She appears well-developed and well-nourished. No distress.  HENT:  Head: Normocephalic and atraumatic.  Mouth/Throat: Oropharynx is clear and moist. No oropharyngeal exudate.  Eyes: Conjunctivae and EOM are normal. No scleral icterus.  Neck: Normal range of motion.  Cardiovascular: Normal rate, regular rhythm and intact distal pulses.   Pulmonary/Chest: Effort normal. No respiratory distress.  Abdominal: Soft. She exhibits no distension and no mass. There is tenderness (mild, suprapubic). There is no rebound and no guarding.  No peritoneal signs or guarding  Musculoskeletal: Normal  range of motion.  Neurological: She is alert and oriented to person, place, and time.  GCS 15. Speech is goal oriented. Patient moves extremities without ataxia. She is ambulatory with normal gait.  Skin: Skin is warm and dry. No rash noted. She is not diaphoretic. No erythema. No pallor.  Psychiatric: She has a normal mood and affect. Her behavior is normal.    ED Course  Procedures (including critical care time) Labs Review Labs Reviewed  URINALYSIS, ROUTINE W REFLEX MICROSCOPIC - Abnormal; Notable for the following:    APPearance CLOUDY (*)    Specific Gravity, Urine 1.004 (*)    Hgb urine dipstick LARGE (*)    Leukocytes, UA LARGE (*)    All other components within normal limits  URINE CULTURE  URINE MICROSCOPIC-ADD ON   Imaging Review No results found.  EKG Interpretation   None       MDM   Final diagnoses:  Hemorrhagic cystitis   68 year old female presents via EMS for dysuria and hematuria. Symptoms have been progressing over the last 5 days. Patient well and nontoxic appearing, hemodynamically stable, and afebrile. She has mild tenderness in her suprapubic region on physical exam with deep palpation only. Patient intoxicated, however speaking in full goal oriented sentences. She is ambulatory with normal gait and without assistance. She moves her extremities without ataxia. Urinalysis pending.  Urinalysis today does suggest infection; findings consistent with hemorrhagic cystitis. Abdominal reexamination stable. Do not believe further emergent workup is indicated at this time. Findings and return precautions reviewed with patient who verbalizes understanding. Patient given first dose of Keflex in ED. She is stable for discharge in the care of a sober adult or via [facility] to sober until morning. She has no unaddressed concerns.   Filed Vitals:   12/08/13 0052  BP: 151/91  Pulse: 91  Temp: 98.9 F (37.2 C)  TempSrc: Oral  Resp: 19  Height: 5\' 6"  (1.676 m)  Weight:  215 lb (97.523 kg)  SpO2: 99%       Antony Madura, PA-C 12/08/13 [phone]

## 2013-12-10 LAB — URINE CULTURE

## 2014-12-02 ENCOUNTER — Encounter (HOSPITAL_COMMUNITY): Payer: Self-pay

## 2014-12-02 ENCOUNTER — Emergency Department (HOSPITAL_COMMUNITY)
Admission: EM | Admit: 2014-12-02 | Discharge: 2014-12-03 | Disposition: A | Discharge status: Other Acute Inpatient Hospital | Payer: Medicare HMO | Source: Home / Self Care | Attending: Emergency Medicine | Admitting: Emergency Medicine

## 2014-12-02 DIAGNOSIS — F329 Major depressive disorder, single episode, unspecified: Secondary | ICD-10-CM | POA: Diagnosis not present

## 2014-12-02 DIAGNOSIS — F1024 Alcohol dependence with alcohol-induced mood disorder: Secondary | ICD-10-CM | POA: Diagnosis not present

## 2014-12-02 DIAGNOSIS — R45851 Suicidal ideations: Secondary | ICD-10-CM
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
## 2014-12-02 NOTE — ED Notes (Signed)
Bed: WA04 Expected date: 12/02/14 Expected time: 11:36 PM Means of arrival: Ambulance Comments: etoh/suicidal

## 2014-12-03 ENCOUNTER — Inpatient Hospital Stay (HOSPITAL_COMMUNITY)
Admission: EM | Admit: 2014-12-03 | Discharge: 2014-12-05 | DRG: 897 | Disposition: A | Discharge status: Home / Self Care | Payer: Medicare HMO | Source: Intra-hospital | Attending: Psychiatry | Admitting: Psychiatry

## 2014-12-03 ENCOUNTER — Encounter (HOSPITAL_COMMUNITY): Payer: Self-pay

## 2014-12-03 DIAGNOSIS — R45851 Suicidal ideations: Secondary | ICD-10-CM | POA: Diagnosis present

## 2014-12-03 DIAGNOSIS — Z6837 Body mass index (BMI) 37.0-37.9, adult: Secondary | ICD-10-CM | POA: Diagnosis not present

## 2014-12-03 DIAGNOSIS — G8929 Other chronic pain: Secondary | ICD-10-CM | POA: Diagnosis present

## 2014-12-03 DIAGNOSIS — F1014 Alcohol abuse with alcohol-induced mood disorder: Secondary | ICD-10-CM | POA: Diagnosis not present

## 2014-12-03 DIAGNOSIS — Y909 Presence of alcohol in blood, level not specified: Secondary | ICD-10-CM

## 2014-12-03 DIAGNOSIS — E669 Obesity, unspecified: Secondary | ICD-10-CM | POA: Diagnosis present

## 2014-12-03 DIAGNOSIS — Y908 Blood alcohol level of 240 mg/100 ml or more: Secondary | ICD-10-CM | POA: Diagnosis present

## 2014-12-03 DIAGNOSIS — F1010 Alcohol abuse, uncomplicated: Secondary | ICD-10-CM | POA: Insufficient documentation

## 2014-12-03 DIAGNOSIS — F1024 Alcohol dependence with alcohol-induced mood disorder: Principal | ICD-10-CM | POA: Diagnosis present

## 2014-12-03 DIAGNOSIS — F1094 Alcohol use, unspecified with alcohol-induced mood disorder: Secondary | ICD-10-CM | POA: Diagnosis present

## 2014-12-03 DIAGNOSIS — F329 Major depressive disorder, single episode, unspecified: Secondary | ICD-10-CM | POA: Diagnosis present

## 2014-12-03 DIAGNOSIS — I10 Essential (primary) hypertension: Secondary | ICD-10-CM | POA: Diagnosis present

## 2014-12-03 DIAGNOSIS — Z79899 Other long term (current) drug therapy: Secondary | ICD-10-CM

## 2014-12-03 DIAGNOSIS — G4700 Insomnia, unspecified: Secondary | ICD-10-CM | POA: Diagnosis present

## 2014-12-03 LAB — RAPID URINE DRUG SCREEN, HOSP PERFORMED
Amphetamines: NOT DETECTED
Barbiturates: NOT DETECTED
Benzodiazepines: NOT DETECTED
Cocaine: NOT DETECTED
Opiates: NOT DETECTED
Tetrahydrocannabinol: NOT DETECTED

## 2014-12-03 LAB — CBC
HEMATOCRIT: 40.5 % (ref 36.0–46.0)
HEMOGLOBIN: 13.7 g/dL (ref 12.0–15.0)
MCH: 29 pg (ref 26.0–34.0)
MCHC: 33.8 g/dL (ref 30.0–36.0)
MCV: 85.6 fL (ref 78.0–100.0)
Platelets: 137 10*3/uL — ABNORMAL LOW (ref 150–400)
RBC: 4.73 MIL/uL (ref 3.87–5.11)
RDW: 14.6 % (ref 11.5–15.5)
WBC: 6.5 10*3/uL (ref 4.0–10.5)

## 2014-12-03 LAB — BASIC METABOLIC PANEL
Anion gap: 12 (ref 5–15)
BUN: 15 mg/dL (ref 6–23)
CALCIUM: 10.6 mg/dL — AB (ref 8.4–10.5)
CHLORIDE: 112 mmol/L (ref 96–112)
CO2: 20 mmol/L (ref 19–32)
Creatinine, Ser: 0.87 mg/dL (ref 0.50–1.10)
GFR calc Af Amer: 79 mL/min — ABNORMAL LOW (ref >90)
GFR calc non Af Amer: 68 mL/min — ABNORMAL LOW (ref >90)
GLUCOSE: 116 mg/dL — AB (ref 70–99)
Potassium: 3.6 mmol/L (ref 3.5–5.1)
SODIUM: 144 mmol/L (ref 135–145)

## 2014-12-03 LAB — ETHANOL: Alcohol, Ethyl (B): 247 mg/dL — ABNORMAL HIGH (ref 0–9)

## 2014-12-03 MED ORDER — HYDROXYZINE HCL 25 MG PO TABS
25.0000 mg | ORAL_TABLET | Freq: Four times a day (QID) | ORAL | Status: AC | PRN
  Administered 2014-12-04: 25 mg via ORAL
  Filled 2014-12-03: qty 1

## 2014-12-03 MED ORDER — VITAMIN B-1 100 MG PO TABS
100.0000 mg | ORAL_TABLET | Freq: Every day | ORAL | Status: DC
  Administered 2014-12-04 – 2014-12-05 (×2): 100 mg via ORAL
  Filled 2014-12-03 (×4): qty 1

## 2014-12-03 MED ORDER — CHLORDIAZEPOXIDE HCL 25 MG PO CAPS: 25.0000 mg | ORAL_CAPSULE | Freq: Four times a day (QID) | ORAL | Status: AC | PRN

## 2014-12-03 MED ORDER — CHLORDIAZEPOXIDE HCL 25 MG PO CAPS
25.0000 mg | ORAL_CAPSULE | Freq: Four times a day (QID) | ORAL | Status: AC
  Administered 2014-12-03 (×4): 25 mg via ORAL
  Filled 2014-12-03 (×4): qty 1

## 2014-12-03 MED ORDER — CHLORDIAZEPOXIDE HCL 25 MG PO CAPS
25.0000 mg | ORAL_CAPSULE | ORAL | Status: AC
  Administered 2014-12-05: 25 mg via ORAL
  Filled 2014-12-03: qty 1

## 2014-12-03 MED ORDER — TRAZODONE HCL 50 MG PO TABS
50.0000 mg | ORAL_TABLET | Freq: Every evening | ORAL | Status: DC | PRN
  Filled 2014-12-03 (×4): qty 1

## 2014-12-03 MED ORDER — ONDANSETRON 4 MG PO TBDP
4.0000 mg | ORAL_TABLET | Freq: Four times a day (QID) | ORAL | Status: AC | PRN
Start: 2014-12-03 — End: 2014-12-06

## 2014-12-03 MED ORDER — LOPERAMIDE HCL 2 MG PO CAPS: 2.0000 mg | ORAL_CAPSULE | ORAL | Status: AC | PRN

## 2014-12-03 MED ORDER — ALUM & MAG HYDROXIDE-SIMETH 200-200-20 MG/5ML PO SUSP: 30.0000 mL | ORAL | Status: DC | PRN

## 2014-12-03 MED ORDER — LISINOPRIL 5 MG PO TABS
5.0000 mg | ORAL_TABLET | Freq: Every day | ORAL | Status: DC
  Administered 2014-12-03 – 2014-12-05 (×3): 5 mg via ORAL
  Filled 2014-12-03: qty 1
  Filled 2014-12-03: qty 3
  Filled 2014-12-03 (×3): qty 1

## 2014-12-03 MED ORDER — DULOXETINE HCL 20 MG PO CPEP
20.0000 mg | ORAL_CAPSULE | Freq: Every day | ORAL | Status: DC
  Administered 2014-12-03 – 2014-12-05 (×3): 20 mg via ORAL
  Filled 2014-12-03 (×6): qty 1
  Filled 2014-12-03: qty 3

## 2014-12-03 MED ORDER — QUETIAPINE FUMARATE 25 MG PO TABS
25.0000 mg | ORAL_TABLET | Freq: Every day | ORAL | Status: DC
  Administered 2014-12-03 – 2014-12-04 (×2): 25 mg via ORAL
  Filled 2014-12-03 (×4): qty 1
  Filled 2014-12-03: qty 3

## 2014-12-03 MED ORDER — THIAMINE HCL 100 MG/ML IJ SOLN
100.0000 mg | Freq: Once | INTRAMUSCULAR | Status: AC
  Administered 2014-12-03: 100 mg via INTRAMUSCULAR

## 2014-12-03 MED ORDER — MAGNESIUM HYDROXIDE 400 MG/5ML PO SUSP: 30.0000 mL | Freq: Every day | ORAL | Status: DC | PRN

## 2014-12-03 MED ORDER — ASPIRIN EC 81 MG PO TBEC
81.0000 mg | DELAYED_RELEASE_TABLET | Freq: Every day | ORAL | Status: DC
  Administered 2014-12-03: 81 mg via ORAL
  Filled 2014-12-03 (×3): qty 1

## 2014-12-03 MED ORDER — CHLORDIAZEPOXIDE HCL 25 MG PO CAPS: 25.0000 mg | ORAL_CAPSULE | Freq: Every day | ORAL | Status: DC

## 2014-12-03 MED ORDER — ADULT MULTIVITAMIN W/MINERALS CH
1.0000 | ORAL_TABLET | Freq: Every day | ORAL | Status: DC
  Administered 2014-12-03 – 2014-12-05 (×3): 1 via ORAL
  Filled 2014-12-03 (×5): qty 1

## 2014-12-03 MED ORDER — CHLORDIAZEPOXIDE HCL 25 MG PO CAPS
25.0000 mg | ORAL_CAPSULE | Freq: Three times a day (TID) | ORAL | Status: AC
  Administered 2014-12-04 (×3): 25 mg via ORAL
  Filled 2014-12-03 (×3): qty 1

## 2014-12-03 MED ORDER — ACETAMINOPHEN 325 MG PO TABS
650.0000 mg | ORAL_TABLET | Freq: Four times a day (QID) | ORAL | Status: DC | PRN
  Administered 2014-12-03: 650 mg via ORAL
  Filled 2014-12-03: qty 2

## 2014-12-03 MED ORDER — IBUPROFEN 600 MG PO TABS
600.0000 mg | ORAL_TABLET | Freq: Four times a day (QID) | ORAL | Status: DC | PRN
  Administered 2014-12-04: 600 mg via ORAL
  Filled 2014-12-03: qty 1

## 2014-12-03 MED ORDER — CLONIDINE HCL 0.1 MG PO TABS
0.1000 mg | ORAL_TABLET | Freq: Two times a day (BID) | ORAL | Status: DC
  Administered 2014-12-03 – 2014-12-05 (×5): 0.1 mg via ORAL
  Filled 2014-12-03 (×5): qty 1
  Filled 2014-12-03 (×2): qty 6
  Filled 2014-12-03 (×3): qty 1

## 2014-12-03 MED ORDER — INFLUENZA VAC SPLIT QUAD 0.5 ML IM SUSY
0.5000 mL | PREFILLED_SYRINGE | INTRAMUSCULAR | Status: AC
  Administered 2014-12-04: 0.5 mL via INTRAMUSCULAR
  Filled 2014-12-03: qty 0.5

## 2014-12-03 NOTE — BH Assessment (Addendum)
Tele Assessment Note   Christie Meza is an African-American, single, 69 y.o. female who presents to [facility] via EMS under IVC due to suicidal ideation with plan. EMS reported that pt's house alarm was going off and that when they arrived on scene, pt was intoxicated and stated that she wanted someone to shoot her in the head; therefore, [employer] took out IVC papers on pt. Pt presents for assessment with slightly irritable affect, poor eye-contact, and slow speech. Thought process is relevant and linear. Speech is coherent with no evidence of delusional content. Appearance is unremarkable. Pt does not appear to be responding to any internal stimuli. Pt is oriented x4 but is drowsy and keeps her eyes closed for much of the interview. Pt reports that she is "just tired of living" and has been having SI yesterday and today, but she does not express any intent to counselor. Pt reports a long hx of depression and 2 prior suicide attempts. She states that her depression worsened in Nov 2015 when her boyfriend went to jail and that is when she began to drink heavily -- about 1 pint of liquor per day. She denied use of any other substances to counselor. UDS was negative and BAL was 247 at 01:00. Pt endorses depressed mood, feelings of hopelessness, insomnia, crying spells, social isolation, loss of pleasure, decreased appetite, and suicidal thoughts. Pt has a hx of Bipolar I diagnosis and endorses some hypomanic sx, such as decreased need for sleep and impulsivity. She also says that she struggles with anxiety and nervousness at times, which leads to panic attacks.  Pt does not currently see a psychiatrist or therapist but says that she did many years ago. Pt says she also used to take anti-depressants many years ago but stopped taking those as well. She has at least one prior psychiatric hospitalization for SI and depression in [place] 10-15 years ago. Pt denies A/VH or HI. She says she has only experienced homicidal  thoughts one time in her life, years ago. She denies access to weapons. She denies any hx of abuse or trauma. Pt denies any health problems besides hypertension. She is independent with ADL's and states that she does not use any assistive devices.  Per Donell Sievert, PA, pt meets inpt criteria. Per Bunnie Pion, pt accepted to [facility] bed 406-1 by Dr. Dub Mikes.  Axis I: 296.52 Bipolar I disorder, Current or most recent episode depressed, Moderate, by Hx           303.90 Alcohol use disorder, Moderate Axis II: Deferred Axis III:  Past Medical History  Diagnosis Date  . Hypertension   . Bipolar 1 disorder   . Obesity    Axis IV: other psychosocial or environmental problems, problems related to social environment and problems with primary support group Axis V: 31-40 impairment in reality testing  Past Medical History:  Past Medical History  Diagnosis Date  . Hypertension   . Bipolar 1 disorder   . Obesity     Past Surgical History  Procedure Laterality Date  . Abdominal hysterectomy  ~2002    Family History:  Family History  Problem Relation Age of Onset  . Kidney disease Mother   . Hypertension Mother   . Kidney disease Brother     Social History:  reports that she has never smoked. She has never used smokeless tobacco. She reports that she drinks alcohol. She reports that she uses illicit drugs (Cocaine).  Additional Social History:  Alcohol / Drug Use Pain  Medications: See PTA List Prescriptions: See PTA List; Pt denies taking any medications Over the Counter: See PTA List History of alcohol / drug use?: Yes Longest period of sobriety (when/how long): Unknown Negative Consequences of Use: Personal relationships Withdrawal Symptoms: Irritability, Patient aware of relationship between substance abuse and physical/medical complications Substance #1 Name of Substance 1: etoh 1 - Age of First Use: 20's; Started drinking heavily last Nov (Age 69) 1 - Amount (size/oz): 1 pint of  liquor 1 - Frequency: Every night 1 - Duration: Past 3-4 months 1 - Last Use / Amount: Last night  CIWA: CIWA-Ar BP: 154/83 mmHg Pulse Rate: 91 Nausea and Vomiting: no nausea and no vomiting Tactile Disturbances: none Tremor: no tremor Auditory Disturbances: very mild harshness or ability to frighten Paroxysmal Sweats: no sweat visible Visual Disturbances: not present Anxiety: no anxiety, at ease Headache, Fullness in Head: mild Agitation: normal activity Orientation and Clouding of Sensorium: oriented and can do serial additions CIWA-Ar Total: 3 COWS:    PATIENT STRENGTHS: (choose at least two) Ability for insight Average or above average intelligence Communication skills Physical Health Supportive family/friends  Allergies: No Known Allergies  Home Medications:  (Not in a hospital admission)  OB/GYN Status:  No LMP recorded. Patient has had a hysterectomy.  General Assessment Data Location of Assessment: [facility] ED Is this a Tele or Face-to-Face Assessment?: Tele Assessment Is this an Initial Assessment or a Re-assessment for this encounter?: Initial Assessment Living Arrangements: Alone Can pt return to current living arrangement?: Yes Admission Status: Involuntary Is patient capable of signing voluntary admission?: No Transfer from: Home [facility] Living Arrangements: Alone Name of Psychiatrist: None Name of Therapist: None  Education Status Is patient currently in school?: No Current Grade: na Highest grade of school patient has completed: na Name of school: na Contact person: na  Risk to self with the past 6 months Suicidal Ideation: Yes-Currently Present Suicidal Intent: No Is patient at risk for suicide?: Yes Suicidal Plan?: Yes-Currently Present Specify Current Suicidal Plan: Shooting Access to Means: No What has been your use of drugs/alcohol within the last 12 months?: Daily etoh use since Nov  2015 Previous Attempts/Gestures: Yes How many times?: 1 Other Self Harm Risks: SA Triggers for Past Attempts: Unknown Intentional Self Injurious Behavior: None Family Suicide History: No Recent stressful life event(s): Other (Comment) (Boyfriend went to jail in Nov 2015) Persecutory voices/beliefs?: No Depression: Yes Depression Symptoms: Insomnia, Tearfulness, Isolating, Loss of interest in usual pleasures, Feeling worthless/self pity, Feeling angry/irritable Substance abuse history and/or treatment for substance abuse?: Yes Suicide prevention information given to non-admitted patients: Not applicable  Risk to Others within the past 6 months Homicidal Ideation: No Thoughts of Harm to Others: No Current Homicidal Intent: No Current Homicidal Plan: No Access to Homicidal Means: No History of harm to others?: No Assessment of Violence: None Noted Violent Behavior Description: Pt agitated in triage but no violent behavior noted Does patient have access to weapons?: No Criminal Charges Pending?: No Does patient have a court date: No  Psychosis Hallucinations: None noted Delusions: None noted  Mental Status Report Appear/Hygiene: Unremarkable, In scrubs Eye Contact: Poor Motor Activity: Unremarkable Speech: Logical/coherent, Slow Level of Consciousness: Drowsy Mood: Depressed Affect: Irritable Anxiety Level: None Thought Processes: Coherent, Relevant Judgement: Partial Orientation: Person, Place, Time, Situation Obsessive Compulsive Thoughts/Behaviors: None  Cognitive Functioning Concentration: Decreased Memory: Recent Intact, Remote Intact IQ: Average Insight: Fair Impulse Control: Fair Appetite: Poor Weight  Loss: 0 Weight Gain: 0 Sleep: Decreased Total Hours of Sleep: 4 Vegetative Symptoms: None  ADLScreening [facility] Assessment Services) Patient's cognitive ability adequate to safely complete daily activities?: Yes Patient able to express need for assistance with  ADLs?: Yes Independently performs ADLs?: Yes (appropriate for developmental age)  Prior Inpatient Therapy Prior Inpatient Therapy: Yes Prior Therapy Dates: 2000 Prior Therapy Facilty/Provider(s): [facility] Reason for Treatment: SI, depression  Prior Outpatient Therapy Prior Outpatient Therapy: Yes Prior Therapy Dates: Unknown Prior Therapy Facilty/Provider(s): Unknown Reason for Treatment: Depression  ADL Screening (condition at time of admission) Patient's cognitive ability adequate to safely complete daily activities?: Yes Is the patient deaf or have difficulty hearing?: No Does the patient have difficulty seeing, even when wearing glasses/contacts?: No Does the patient have difficulty concentrating, remembering, or making decisions?: No Patient able to express need for assistance with ADLs?: Yes Does the patient have difficulty dressing or bathing?: No Independently performs ADLs?: Yes (appropriate for developmental age) Does the patient have difficulty walking or climbing stairs?: No Weakness of Legs: None Weakness of Arms/Hands: None  Home Assistive Devices/Equipment Home Assistive Devices/Equipment: None    Abuse/Neglect Assessment (Assessment to be complete while patient is alone) Physical Abuse: Denies Verbal Abuse: Denies Sexual Abuse: Denies Exploitation of patient/patient's resources: Denies Self-Neglect: Denies Values / Beliefs Cultural Requests During Hospitalization: None Spiritual Requests During Hospitalization: None   Advance Directives (For Healthcare) Does patient have an advance directive?: No    Additional Information 1:1 In Past 12 Months?: No CIRT Risk: No Elopement Risk: No Does patient have medical clearance?: No     Disposition: Per Donell Sievert, PA, pt meets inpt criteria. Per Bunnie Pion, pt accepted to [facility] bed 406-1 by Dr. Dub Mikes. Disposition Initial Assessment Completed for this Encounter: Yes Disposition of Patient: Inpatient  treatment program Type of inpatient treatment program: Adult  Cyndie Mull, [facility] Triage Specialist  12/03/2014 4:19 AM

## 2014-12-03 NOTE — Tx Team (Signed)
Initial Interdisciplinary Treatment Plan   PATIENT STRESSORS: Marital or family conflict Medication change or noncompliance Substance abuse   PATIENT STRENGTHS: Active sense of humor Capable of independent living General fund of knowledge   PROBLEM LIST: Problem List/Patient Goals Date to be addressed Date deferred Reason deferred Estimated date of resolution  Risk for suicide      ETOH      "help with my mouth, going home"                                           DISCHARGE CRITERIA:  Improved stabilization in mood, thinking, and/or behavior Verbal commitment to aftercare and medication compliance  PRELIMINARY DISCHARGE PLAN: Attend aftercare/continuing care group Outpatient therapy Participate in family therapy  PATIENT/FAMIILY INVOLVEMENT: This treatment plan has been presented to and reviewed with the patient, Barnetta HammersmithBrenda Venables.  The patient and family have been given the opportunity to ask questions and make suggestions.  Delos Haringhillips, Tifany Hirsch A 12/03/2014, 6:44 AM

## 2014-12-03 NOTE — H&P (Signed)
Psychiatric Admission Assessment Adult  Patient Identification: Christie Meza MRN:  [id number] Date of Evaluation:  12/03/2014 Chief Complaint:  MDD Principal Diagnosis: Alcohol-induced mood disorder Diagnosis:   Patient Active Problem List   Diagnosis Date Noted  . 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 Alcohol abuse [F10.10]   . Chest pain [R07.9] 10/18/2013  . Abnormal ECG [R94.31] 10/18/2013  . Flank pain [R10.9] 10/18/2013  . Obesity [E66.9]   . Bipolar 1 disorder [F31.9]   . Hypertension [I10]   . Accidental acetaminophen overdose [T39.1X1A] 04/17/2012    Class: Acute   History of Present Illness: Christie Meza is a 69 year old female single woman patient who came in after "passing out".  She states that she blacked out and could not remember anything.  She reports needing alcohol to help with sleep.  She reports every other day drinking fifths of a vodka and it will last her a week.  She states she has a BF who is incarcerated.  She denies any legal trouble but did have a DUI in 1997.  She states that she had attended [place] meetings in the past.  Per patient, back in 2000, she attempted suicide by overdose.  She denies HI/AVH and panic attacks.  She has hypertension and depression but is non compliant on both psychotropic (said she was on Zoloft a long time ago) and BP meds.  She denies DM.  Elements:  Location:  Alcohol induced mood disorder. Quality:  Feelings of anxiety, hopelss, insomnia. Severity:  Severe. Timing:  Yesterday. Duration:  Ongoing. Context:  I need alcohol to help me sleep.  I have bad insomnia. Associated Signs/Symptoms: Depression Symptoms:  depressed mood, insomnia, (Hypo) Manic Symptoms:  Irritable Mood, Labiality of Mood, Anxiety Symptoms:  Social Anxiety, Psychotic Symptoms:  NA PTSD Symptoms:  NA Total Time spent with patient: 30 minutes  Past Medical History:  Past Medical History  Diagnosis Date  . Hypertension   . Bipolar 1  disorder   . Obesity     Past Surgical History  Procedure Laterality Date  . Abdominal hysterectomy  ~2002   Family History:  Family History  Problem Relation Age of Onset  . Kidney disease Mother   . Hypertension Mother   . Kidney disease Brother    Social History:  History  Alcohol Use  . 0.0 oz/week     History  Drug Use  . Yes    History   Social History  . Marital Status: Single    Spouse Name: N/A  . Number of Children: N/A  . Years of Education: N/A   Occupational History  . disability     mental health    Social History Main Topics  . Smoking status: Never Smoker   . Smokeless tobacco: Never Used  . Alcohol Use: 0.0 oz/week  . Drug Use: Yes  . Sexual Activity: No   Other Topics Concern  . None   Social History Narrative   She lives in [place]. She has two kids. She is not married.  She is on disability for bipolar    Additional Social History:  Musculoskeletal: Strength & Muscle Tone: within normal limits Gait & Station: normal Patient leans: N/A  Psychiatric Specialty Exam: Physical Exam  Vitals reviewed. Psychiatric: She exhibits a depressed mood.    Review of Systems  Constitutional: Negative.   HENT: Negative.   Eyes: Negative.   Respiratory: Negative.   Cardiovascular: Negative.   Gastrointestinal: Negative.   Genitourinary: Negative.  Musculoskeletal: Negative.   Skin: Negative.   Neurological: Negative.   Endo/Heme/Allergies: Negative.   Psychiatric/Behavioral: Positive for depression. The patient is nervous/anxious and has insomnia.     Blood pressure 154/86, pulse 91, temperature 98.6 F (37 C), temperature source Oral, resp. rate 16, height $RemoveBe'5\' 6"'cMztSzTaF$  (1.676 m), weight 104.327 kg (230 lb).Body mass index is 37.14 kg/(m^2).   General Appearance: Fairly Groomed  Engineer, water:: Good  Speech: Normal Rate  Volume: Normal  Mood: mildly depressed  Affect: appropriate, reactive, does smile at times appropriately   Thought Process: Goal Directed and Linear  Orientation: Other: fully alert and attentive  Thought Content: denies hallucinations, no delusions, does not appear internally preoccupied, not internally preoccupied   Suicidal Thoughts: No- at this time denies any thoughts of hurting self or anyone else   Homicidal Thoughts: No  Memory: Recent and remote grossly intact   Judgement: Fair  Insight: Fair  Psychomotor Activity: Normal  Concentration: Good  Recall: NA  Fund of Knowledge:Good  Language: Good  Akathisia: No  Handed: Right  AIMS (if indicated):    Assets: Desire for Improvement Housing Resilience  Sleep:    Cognition: WNL  ADL's: Fair        Risk to Self: Is patient at risk for suicide?: No What has been your use of drugs/alcohol within the last 12 months?: Patient reports drinking at least four ounces of alcohol nightly to sleep Risk to Others:   Prior Inpatient Therapy:   Prior Outpatient Therapy:    Alcohol Screening: 1. How often do you have a drink containing alcohol?: 4 or more times a week 2. How many drinks containing alcohol do you have on a typical day when you are drinking?: 1 or 2 3. How often do you have six or more drinks on one occasion?: Never Preliminary Score: 0 9. Have you or someone else been injured as a result of your drinking?: No 10. Has a relative or friend or a doctor or another health worker been concerned about your drinking or suggested you cut down?: Yes, during the last year Alcohol Use Disorder Identification Test Final Score (AUDIT): 8 Brief Intervention: Patient declined brief intervention  Allergies:   Allergies  Allergen Reactions  . Aspirin Hives   Lab Results:  Results for orders placed or performed during the hospital encounter of 12/02/14 (from the past 48 hour(s))  Ethanol     Status: Abnormal   Collection Time: 12/03/14  1:05 AM  Result Value Ref Range   Alcohol, Ethyl (B) 247 (H) 0  - 9 mg/dL    Comment:        LOWEST DETECTABLE LIMIT FOR SERUM ALCOHOL IS 11 mg/dL FOR MEDICAL PURPOSES ONLY   Basic metabolic panel     Status: Abnormal   Collection Time: 12/03/14  1:05 AM  Result Value Ref Range   Sodium 144 135 - 145 mmol/L   Potassium 3.6 3.5 - 5.1 mmol/L   Chloride 112 96 - 112 mmol/L   CO2 20 19 - 32 mmol/L   Glucose, Bld 116 (H) 70 - 99 mg/dL   BUN 15 6 - 23 mg/dL   Creatinine, Ser 0.87 0.50 - 1.10 mg/dL   Calcium 10.6 (H) 8.4 - 10.5 mg/dL   GFR calc non Af Amer 68 (L) >90 mL/min   GFR calc Af Amer 79 (L) >90 mL/min    Comment: (NOTE) The eGFR has been calculated using the CKD EPI equation. This calculation has not been validated in  all clinical situations. eGFR's persistently <90 mL/min signify possible Chronic Kidney Disease.    Anion gap 12 5 - 15  CBC     Status: Abnormal   Collection Time: 12/03/14  1:05 AM  Result Value Ref Range   WBC 6.5 4.0 - 10.5 K/uL   RBC 4.73 3.87 - 5.11 MIL/uL   Hemoglobin 13.7 12.0 - 15.0 g/dL   HCT 40.5 36.0 - 46.0 %   MCV 85.6 78.0 - 100.0 fL   MCH 29.0 26.0 - 34.0 pg   MCHC 33.8 30.0 - 36.0 g/dL   RDW 14.6 11.5 - 15.5 %   Platelets 137 (L) 150 - 400 K/uL  Urine rapid drug screen (hosp performed)     Status: None   Collection Time: 12/03/14  1:05 AM  Result Value Ref Range   Opiates NONE DETECTED NONE DETECTED   Cocaine NONE DETECTED NONE DETECTED   Benzodiazepines NONE DETECTED NONE DETECTED   Amphetamines NONE DETECTED NONE DETECTED   Tetrahydrocannabinol NONE DETECTED NONE DETECTED   Barbiturates NONE DETECTED NONE DETECTED    Comment:        DRUG SCREEN FOR MEDICAL PURPOSES ONLY.  IF CONFIRMATION IS NEEDED FOR ANY PURPOSE, NOTIFY LAB WITHIN 5 DAYS.        LOWEST DETECTABLE LIMITS FOR URINE DRUG SCREEN Drug Class       Cutoff (ng/mL) Amphetamine      1000 Barbiturate      200 Benzodiazepine   086 Tricyclics       761 Opiates          300 Cocaine          300 THC              50    Current  Medications: Current Facility-Administered Medications  Medication Dose Route Frequency Provider Last Rate Last Dose  . acetaminophen (TYLENOL) tablet 650 mg  650 mg Oral Q6H PRN Laverle Hobby, PA-C   650 mg at 12/03/14 1200  . alum & mag hydroxide-simeth (MAALOX/MYLANTA) 200-200-20 MG/5ML suspension 30 mL  30 mL Oral Q4H PRN Laverle Hobby, PA-C      . aspirin EC tablet 81 mg  81 mg Oral Daily Laverle Hobby, PA-C   81 mg at 12/03/14 9509  . chlordiazePOXIDE (LIBRIUM) capsule 25 mg  25 mg Oral Q6H PRN Laverle Hobby, PA-C      . chlordiazePOXIDE (LIBRIUM) capsule 25 mg  25 mg Oral QID Laverle Hobby, PA-C   25 mg at 12/03/14 1158   Followed by  . [START ON 12/04/2014] chlordiazePOXIDE (LIBRIUM) capsule 25 mg  25 mg Oral TID Laverle Hobby, PA-C       Followed by  . [START ON 12/05/2014] chlordiazePOXIDE (LIBRIUM) capsule 25 mg  25 mg Oral BH-qamhs Spencer E Simon, PA-C       Followed by  . [START ON 12/06/2014] chlordiazePOXIDE (LIBRIUM) capsule 25 mg  25 mg Oral Daily Laverle Hobby, PA-C      . cloNIDine (CATAPRES) tablet 0.1 mg  0.1 mg Oral BID Laverle Hobby, PA-C   0.1 mg at 12/03/14 3267  . hydrOXYzine (ATARAX/VISTARIL) tablet 25 mg  25 mg Oral Q6H PRN Laverle Hobby, PA-C      . [START ON 12/04/2014] Influenza vac split quadrivalent PF (FLUARIX) injection 0.5 mL  0.5 mL Intramuscular Tomorrow-1000 Nicholaus Bloom, MD      . lisinopril (PRINIVIL,ZESTRIL) tablet 5 mg  5 mg Oral Daily  Laverle Hobby, PA-C   5 mg at 12/03/14 4503  . loperamide (IMODIUM) capsule 2-4 mg  2-4 mg Oral PRN Laverle Hobby, PA-C      . magnesium hydroxide (MILK OF MAGNESIA) suspension 30 mL  30 mL Oral Daily PRN Laverle Hobby, PA-C      . multivitamin with minerals tablet 1 tablet  1 tablet Oral Daily Laverle Hobby, PA-C   1 tablet at 12/03/14 [phone]  . ondansetron (ZOFRAN-ODT) disintegrating tablet 4 mg  4 mg Oral Q6H PRN Laverle Hobby, PA-C      . [START ON 12/04/2014] thiamine (VITAMIN B-1) tablet 100  mg  100 mg Oral Daily Laverle Hobby, PA-C       PTA Medications: Prescriptions prior to admission  Medication Sig Dispense Refill Last Dose  . Ascorbic Acid (VITAMIN C PO) Take 1 tablet by mouth daily.   12/02/2014  . CALCIUM PO Take 1 tablet by mouth daily.   12/02/2014  . Cholecalciferol (VITAMIN D PO) Take 1 tablet by mouth daily.   12/02/2014  . fish oil-omega-3 fatty acids 1000 MG capsule Take 1 g by mouth daily.    12/02/2014  . ibuprofen (ADVIL,MOTRIN) 200 MG tablet Take 600 mg by mouth every 6 (six) hours as needed (For pain.).   12/02/2014  . Multiple Vitamin (MULTIVITAMIN WITH MINERALS) TABS Take 1 tablet by mouth daily.   12/02/2014  . naproxen sodium (ANAPROX) 220 MG tablet Take 220 mg by mouth every 8 (eight) hours as needed (For pain.).   12/02/2014  . VITAMIN A PO Take 1 tablet by mouth daily.   12/02/2014  . cephALEXin (KEFLEX) 500 MG capsule Take 1 capsule (500 mg total) by mouth 2 (two) times daily. (Patient not taking: Reported on 12/03/2014) 20 capsule 0 Completed Course    Previous Psychotropic Medications: Yes   Substance Abuse History in the last 12 months:  Yes.      Consequences of Substance Abuse: Blackouts:    Results for orders placed or performed during the hospital encounter of 12/02/14 (from the past 72 hour(s))  Ethanol     Status: Abnormal   Collection Time: 12/03/14  1:05 AM  Result Value Ref Range   Alcohol, Ethyl (B) 247 (H) 0 - 9 mg/dL    Comment:        LOWEST DETECTABLE LIMIT FOR SERUM ALCOHOL IS 11 mg/dL FOR MEDICAL PURPOSES ONLY   Basic metabolic panel     Status: Abnormal   Collection Time: 12/03/14  1:05 AM  Result Value Ref Range   Sodium 144 135 - 145 mmol/L   Potassium 3.6 3.5 - 5.1 mmol/L   Chloride 112 96 - 112 mmol/L   CO2 20 19 - 32 mmol/L   Glucose, Bld 116 (H) 70 - 99 mg/dL   BUN 15 6 - 23 mg/dL   Creatinine, Ser 0.87 0.50 - 1.10 mg/dL   Calcium 10.6 (H) 8.4 - 10.5 mg/dL   GFR calc non Af Amer 68 (L) >90 mL/min   GFR calc Af Amer 79  (L) >90 mL/min    Comment: (NOTE) The eGFR has been calculated using the CKD EPI equation. This calculation has not been validated in all clinical situations. eGFR's persistently <90 mL/min signify possible Chronic Kidney Disease.    Anion gap 12 5 - 15  CBC     Status: Abnormal   Collection Time: 12/03/14  1:05 AM  Result Value Ref Range   WBC 6.5 4.0 -  10.5 K/uL   RBC 4.73 3.87 - 5.11 MIL/uL   Hemoglobin 13.7 12.0 - 15.0 g/dL   HCT 40.5 36.0 - 46.0 %   MCV 85.6 78.0 - 100.0 fL   MCH 29.0 26.0 - 34.0 pg   MCHC 33.8 30.0 - 36.0 g/dL   RDW 14.6 11.5 - 15.5 %   Platelets 137 (L) 150 - 400 K/uL  Urine rapid drug screen (hosp performed)     Status: None   Collection Time: 12/03/14  1:05 AM  Result Value Ref Range   Opiates NONE DETECTED NONE DETECTED   Cocaine NONE DETECTED NONE DETECTED   Benzodiazepines NONE DETECTED NONE DETECTED   Amphetamines NONE DETECTED NONE DETECTED   Tetrahydrocannabinol NONE DETECTED NONE DETECTED   Barbiturates NONE DETECTED NONE DETECTED    Comment:        DRUG SCREEN FOR MEDICAL PURPOSES ONLY.  IF CONFIRMATION IS NEEDED FOR ANY PURPOSE, NOTIFY LAB WITHIN 5 DAYS.        LOWEST DETECTABLE LIMITS FOR URINE DRUG SCREEN Drug Class       Cutoff (ng/mL) Amphetamine      1000 Barbiturate      200 Benzodiazepine   476 Tricyclics       546 Opiates          300 Cocaine          300 THC              50     Observation Level/Precautions:  15 minute checks  Laboratory:  Per ED, as needed  Psychotherapy:  Group therapy  Medications:  As per medlist  Consultations:  As needed  Discharge Concerns:  Safety  Estimated LOS:  2-5 days  Other:     Psychological Evaluations: Yes   Treatment Plan Summary: Daily contact with patient to assess and evaluate symptoms and progress in treatment and Medication management  Medical Decision Making:  Established Problem, Stable/Improving (1), Self-Limited or Minor (1), Review of Psycho-Social Stressors (1),  Discuss test with performing physician (1), New Problem, with no additional work-up planned (3) and Review or order medicine tests (1)  I certify that inpatient services furnished can reasonably be expected to improve the patient's condition.   Kerrie Buffalo MAY, AGNP-BC 2/10/20163:06 PM   I have reviewed case with NP and have met with patient. I agree with NP's Note, Assessment, Plan Patient is a 69 year old female, who presented to the ED after her home alarm went off and was found on floor. As per notes, patient made statements regarding depression and not wanting to live anymore when she came to ED. At this time denies any SI, although does state she has been having some sadness and loneliness feelings since her boyfriends was incarcerated . She reports daily drinking at night time " to help me sleep". States " I do not drink too much, and denies having any alcohol use disorder. Her BAL upon admission was 247, and has a history of DUI in the past, and prior elevated BALs as per chart. Will be started on Cymbalta to address depression ( patient has some chronic pain issues that may also benefit from this antidepressant trial), and low dose Seroquel for insomnia and night time anxiety/agitation. Will start detox protocol to minimize risk of ETOH WDL

## 2014-12-03 NOTE — ED Notes (Signed)
TTS at bedside. 

## 2014-12-03 NOTE — Progress Notes (Signed)
Patient ID: Christie HammersmithBrenda Meza, female   DOB: 17-Dec-1945, 69 y.o.   MRN: [id number]  Pt repeatedly made efforts to contact daughter and Emergency Contact throughout the day today. Pt unable to reach daughter. Writer and pt wrote down other contact numbers from phone book in purse in locker. Security present. Pt able to reach family members.   Pt's house "Alarm Key" left in labeled brown paper bag for daughter at front desk. Pt requested this key be given to daughter so "she can bring me some clothes." Psychiatric nurseront desk secretary to return key to nurse prior to clocking out.

## 2014-12-03 NOTE — Progress Notes (Signed)
Patient ID: Christie Meza, female   DOB: [birth year], 69 y.o.   MRN: [id number]  Admission Note:  D:68 yr female who presents IVC in no acute distress for the treatment of ETOH , SI and Depression. Pt appears flat and depressed. Pt was calm and cooperative with admission process. PT denies SI/ HI/ AVH at this time. Pt stated " the only reason I drink is to go to sleep, I only drink at  Night". Pt did fall prior to admission, stated she was drunk. Pt called [employer] because she could not get up. Pt does get agitated and irritated as times, but pt is cooperative.   A:Skin was assessed and found to be clear of any abnormal marks . POC and unit policies explained and understanding verbalized. Consents obtained.   R: Pt had no additional questions or concerns.

## 2014-12-03 NOTE — Progress Notes (Signed)
Patient ID: Christie HammersmithBrenda Bordwell, female   DOB: December 27, 1945, 69 y.o.   MRN: [id number] PER STATE REGULATIONS 482.30  THIS CHART WAS REVIEWED FOR MEDICAL NECESSITY WITH RESPECT TO THE PATIENT'S ADMISSION/DURATION OF STAY.  NEXT REVIEW DATE: 12/07/14  Loura HaltBARBARA Phyillis Dascoli, RN, BSN CASE MANAGER

## 2014-12-03 NOTE — [facility] Suicide Risk Assessment (Signed)
[facility] Admission Suicide Risk Assessment   Nursing information obtained from:    Demographic factors:   69 year old female, lives alone, on disability Current Mental Status:   See below Loss Factors:   Boyfriend currently incarcerated  Historical Factors:   history of depression, possible history of alcohol abuse  Risk Reduction Factors:   sense of responsibility for family Total Time spent with patient: 45 minutes Principal Problem: patient states she simply fell at home due to alcohol intoxication and this was misconstrued as her being alcoholic, which she denies  Diagnosis:   Patient Active Problem List   Diagnosis Date Noted  . 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 Chest pain [R07.9] 10/18/2013  . Abnormal ECG [R94.31] 10/18/2013  . Flank pain [R10.9] 10/18/2013  . Obesity [E66.9]   . Bipolar 1 disorder [F31.9]   . Hypertension [I10]   . Accidental acetaminophen overdose [T39.1X1A] 04/17/2012    Class: Acute     Continued Clinical Symptoms:  Alcohol Use Disorder Identification Test Final Score (AUDIT): 8 The "Alcohol Use Disorders Identification Test", Guidelines for Use in Primary Care, Second Edition.  World Science writer [facility]). Score between 0-7:  no or low risk or alcohol related problems. Score between 8-15:  moderate risk of alcohol related problems. Score between 16-19:  high risk of alcohol related problems. Score 20 or above:  warrants further diagnostic evaluation for alcohol dependence and treatment.   CLINICAL FACTORS:  Patient states she has been mildly to moderately depressed about her boyfriend being incarcerated. She drinks daily for insomnia, and states she drinks a fifth of vodka per week. States she fell due to intoxication at home and could not get up. States that in ED " they thought I am alcoholic but I am not".    Musculoskeletal: Strength & Muscle Tone: within normal limits- at this time no significant tremors or diaphoresis noted,  no psychomotor agitation Gait & Station: normal Patient leans: N/A  Psychiatric Specialty Exam: Physical Exam  Review of Systems  Constitutional: Negative for fever and chills.  Respiratory: Negative for cough and shortness of breath.   Cardiovascular: Negative for chest pain.  Gastrointestinal: Negative for nausea, vomiting and melena.  Genitourinary: Negative for dysuria, urgency and frequency.  Skin: Negative for rash.  Neurological: Negative for seizures and headaches.  Psychiatric/Behavioral: Positive for depression and substance abuse.    Blood pressure 154/86, pulse 91, temperature 98.6 F (37 C), temperature source Oral, resp. rate 16, height  (1.676 m), weight 230 lb (104.327 kg).Body mass index is 37.14 kg/(m^2).  General Appearance: Fairly Groomed  Patent attorney::  Good  Speech:  Normal Rate  Volume:  Normal  Mood:  mildly depressed  Affect:  appropriate, reactive, does smile at times appropriately  Thought Process:  Goal Directed and Linear  Orientation:  Other:  fully alert and attentive  Thought Content:  denies hallucinations, no delusions, does not appear internally preoccupied, not internally preoccupied   Suicidal Thoughts:  No- at this time denies any thoughts of hurting self or anyone else   Homicidal Thoughts:  No  Memory:  Recent and remote grossly intact   Judgement:  Fair  Insight:  Fair  Psychomotor Activity:  Normal  Concentration:  Good  Recall:  NA  Fund of Knowledge:Good  Language: Good  Akathisia:  No  Handed:  Right  AIMS (if indicated):     Assets:  Desire for Improvement Housing Resilience  Sleep:     Cognition: WNL  ADL's:  Fair      COGNITIVE FEATURES THAT CONTRIBUTE TO RISK:  Closed-mindedness    SUICIDE RISK:     Moderate:  Frequent suicidal ideation with limited intensity, and duration, some specificity in terms of plans, no associated intent, good self-control, limited dysphoria/symptomatology, some risk factors present,  and identifiable protective factors, including available and accessible social support.  PLAN OF CARE: Patient will be admitted to inpatient psychiatric unit for stabilization and safety. Will provide and encourage milieu participation. Provide medication management and maked adjustments as needed.  Will follow daily.    Medical Decision Making:  Review of Psycho-Social Stressors (1), Review or order clinical lab tests (1), Established Problem, Worsening (2) and Review of Medication Regimen & Side Effects (2)  I certify that inpatient services furnished can reasonably be expected to improve the patient's condition.   Jamas Jaquay 12/03/2014, 2:27 PM

## 2014-12-03 NOTE — ED Notes (Signed)
[facility]

## 2014-12-03 NOTE — [facility] Suicide Risk Assessment (Signed)
[facility] INPATIENT:  Family/Significant Other Suicide Prevention Education  Suicide Prevention Education:  Education Completed; Christie Meza, Daughter, [phone];  has been identified by the patient as the family member/significant other with whom the patient will be residing, and identified as the person(s) who will aid the patient in the event of a mental health crisis (suicidal ideations/suicide attempt).  With written consent from the patient, the family member/significant other has been provided the following suicide prevention education, prior to the and/or following the discharge of the patient.  The suicide prevention education provided includes the following:  Suicide risk factors  Suicide prevention and interventions  National Suicide Hotline telephone number  [facility] assessment telephone number  [facility] Emergency Assistance 911  [facility] and/or Residential Mobile Crisis Unit telephone number  Request made of family/significant other to:  Remove weapons (e.g., guns, rifles, knives), all items previously/currently identified as safety concern.  Daughter advised patient does not have access to weapons.      Remove drugs/medications (over-the-counter, prescriptions, illicit drugs), all items previously/currently identified as a safety concern.  The family member/significant other verbalizes understanding of the suicide prevention education information provided.  The family member/significant other agrees to remove the items of safety concern listed above.  Christie Meza, Christie Meza 12/03/2014, 3:25 PM

## 2014-12-03 NOTE — [facility] Counselor (Addendum)
Per Donell SievertSpencer Simon, PA, pt meets inpt criteria. Per Bunnie Pionori, AC, [facility] Dr. Dub MikesLugo.  Counselor informed RN (Lauren E) of disposition and need for completed CIWA prior to admission. RN agreed [facility].  Counselor informed EDP, Dr Mora Bellmanni, of disposition as well.  Cyndie MullAnna Ozell Ferrera, [facility] Triage Specialist

## 2014-12-03 NOTE — [facility] Group Notes (Signed)
[facility] LCSW Group Therapy  Emotional Regulation 1:15 - 2: 30 PM        12/03/2014  3:13 PM    Type of Therapy:  Group Therapy  Participation Level:  Appropriate  Participation Quality:  Appropriate  Affect:  Appropriate  Cognitive:  Attentive Appropriate  Insight:  Developing/Improving Engaged  Engagement in Therapy:  Developing/Improving Engaged  Modes of Intervention:  Discussion Exploration Problem-Solving Supportive  Summary of Progress/Problems:  Group topic was emotional regulations.  Patient participated in the discussion and was able to identify an emotion that needed to regulated.  She reports anger is the problem is she has to control.  Patient was able to identify approprite coping skills.  Wynn BankerHodnett, Laurel Harnden Hairston 12/03/2014 3:13 PM
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
## 2014-12-03 NOTE — ED Notes (Signed)
Check pt inventory sheet for belongings charted on 12/03/14

## 2014-12-03 NOTE — [facility] Group Notes (Signed)
[facility]   12/03/2014 10:57 AM    Participation Quality:  Appropraite  Mood/Affect:  Appropriate  Depression Rating:  5  Anxiety Rating:  0  Thoughts of Suicide:  No  Will you contract for safety?   NA  Current AVH:  No  Plan for Discharge/Comments:  Patient attended discharge planning group and actively participated in group. Patient advised of having a home and transportation.  She shared she was not seeing a provider prior to admission.  Suicide prevention education reviewed and SPE document provided.   Transportation Means: Patient has transportation.   Supports:  Patient has a support system.   Muhammadali Ries, Joesph JulyQuylle Hairston

## 2014-12-03 NOTE — ED Notes (Signed)
See paper charting for downtime notes. 

## 2014-12-03 NOTE — [facility] Counselor (Signed)
TTS Counselor spoke with attending EDP (Dr. Mora Bellmanni) and attending RN Sandy Salaam(Lynnsey Johnson) in order to gain collateral information. RN reported that pt was heavily intoxicated and had just fallen asleep, so she may be uncooperative with behavioral health assessment at the moment. EDP wanted TTS counselor to attempt assessment, so this was relayed to RN. RN stated that she would place tele-assessment cart in pt's room and attempt to wake her. Assessment to begin shortly.   Cyndie MullAnna Denai Caba, [facility] Triage Specialist

## 2014-12-04 DIAGNOSIS — F1024 Alcohol dependence with alcohol-induced mood disorder: Secondary | ICD-10-CM | POA: Diagnosis not present

## 2014-12-04 LAB — LIPID PANEL
Cholesterol: 216 mg/dL — ABNORMAL HIGH (ref 0–200)
HDL: 51 mg/dL (ref >39)
LDL CALC: 120 mg/dL — AB (ref 0–99)
TRIGLYCERIDES: 227 mg/dL — AB (ref <150)
Total CHOL/HDL Ratio: 4.2 RATIO
VLDL: 45 mg/dL — ABNORMAL HIGH (ref 0–40)

## 2014-12-04 LAB — HEPATIC FUNCTION PANEL
ALT: 23 U/L (ref 0–35)
AST: 24 U/L (ref 0–37)
Albumin: 3.7 g/dL (ref 3.5–5.2)
Alkaline Phosphatase: 120 U/L — ABNORMAL HIGH (ref 39–117)
Total Bilirubin: 0.6 mg/dL (ref 0.3–1.2)
Total Protein: 6.4 g/dL (ref 6.0–8.3)

## 2014-12-04 LAB — TSH: TSH: 1.021 u[IU]/mL (ref 0.350–4.500)

## 2014-12-04 LAB — HEMOGLOBIN A1C
Hgb A1c MFr Bld: 5.6 % (ref 4.8–5.6)
MEAN PLASMA GLUCOSE: 114 mg/dL

## 2014-12-04 NOTE — Progress Notes (Signed)
Patient ID: Christie Meza, female   DOB: Mar 18, 1946, 69 y.o.   MRN: [id number]   D: After the introduction, writer asked her pt about her day.  Pt stated, "I had a good". However, pt's mood wasn't congruent with her statement. Then from the corner of her eye pt looked at the writer and stated, "I'm going to sit down".   A:  Support and encouragement was offered. 15 min checks continued for safety.  R: Pt remains safe.

## 2014-12-04 NOTE — Progress Notes (Signed)
D:Patient states she had a good day.  Patient states her medications are making her drowsy and she can not stay up for group tonight.  Patient states her goal is to take her medications and do what she needs to do so she can go home.  Patient denies SI/HI and denies AVH.  Patient not irritable tonight pleasant and cooperative. A: Staff to monitor Q 15 mins for safety.  Encouragement and support offered.  Scheduled medications administered per orders. R: Patient remains safe on the unit.  Patient did not attend group tonight.  Patient visible on the unit tonight before group.  Patient taking administered medications

## 2014-12-04 NOTE — [facility] Group Notes (Signed)
The focus of this group is to educate the patient on the purpose and policies of crisis stabilization and provide a format to answer questions about their admission.  The group details unit policies and expectations of patients while admitted.  Patient attended 0900 nurse education orientation group this morning.  Patient actively participated, appropriate affect, alert, appropriate insight and engagement.  Today patient will work on 3 goals for discharge.  

## 2014-12-04 NOTE — Progress Notes (Signed)
Psychoeducational Group Note  Date:  12/04/2014 Time:  2100 Group Topic/Focus:  wrap up group  Participation Level: Did Not Attend  Participation Quality:  Not Applicable  Affect:  Not Applicable  Cognitive:  Not Applicable  Insight:  Not Applicable  Engagement in Group: Not Applicable  Additional Comments:  Pt was notified that group was beginning but returned to her room and fell asleep.   Shelah LewandowskySquires, Daris Harkins Carol 12/04/2014, 9:44 PM

## 2014-12-04 NOTE — Progress Notes (Signed)
[facility] MD Progress Note  12/04/2014 4:03 PM Christie Meza  MRN:  [id number] Subjective:  Patient states she is doing "OK".  She denies symptoms of withdrawal. Denies medication side effects, but states they do make her feel tired . She is mostly focused on being discharged soon, and states " I think I will do OK at home" Objective: I have discussed case with treatment team and have met with patient. Patient not currently presenting with symptoms of alcohol withdrawal. Tends to minimize issues related to alcohol abuse. States " I just drink a little bit every night, certainly not endouogh to be a problem". States " I could have fallen even if I had not consumed alcohol". She did express some surprise that her admission BAL was as high as it was ( 247) . She denies any significant depression at this time, but affect remains somewhat constricted. No tremors, no diaphoresis, no acute distress or agitation. No medication side effects- tolerating Cymbalta and low dose Seroquel ( which she states is well tolerated and very helpful for sleep) well. Reviewed side effects. Reviewed labs as below.   Principal Problem: Alcohol-induced mood disorder Diagnosis:   Patient Active Problem List   Diagnosis Date Noted  . 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 Alcohol abuse [F10.10]   . Chest pain [R07.9] 10/18/2013  . Abnormal ECG [R94.31] 10/18/2013  . Flank pain [R10.9] 10/18/2013  . Obesity [E66.9]   . Bipolar 1 disorder [F31.9]   . Hypertension [I10]   . Accidental acetaminophen overdose [T39.1X1A] 04/17/2012    Class: Acute   Total Time spent with patient: 25 minutes    Past Medical History:  Past Medical History  Diagnosis Date  . Hypertension   . Bipolar 1 disorder   . Obesity     Past Surgical History  Procedure Laterality Date  . Abdominal hysterectomy  ~2002   Family History:  Family History  Problem Relation Age of Onset  . Kidney disease Mother   . Hypertension  Mother   . Kidney disease Brother    Social History:  History  Alcohol Use  . 0.0 oz/week     History  Drug Use  . Yes    History   Social History  . Marital Status: Single    Spouse Name: N/A  . Number of Children: N/A  . Years of Education: N/A   Occupational History  . disability     mental health    Social History Main Topics  . Smoking status: Never Smoker   . Smokeless tobacco: Never Used  . Alcohol Use: 0.0 oz/week  . Drug Use: Yes  . Sexual Activity: No   Other Topics Concern  . None   Social History Narrative   She lives in [place]. She has two kids. She is not married.  She is on disability for bipolar    Additional History:    Sleep: improved   Appetite:  Good   Assessment:   Musculoskeletal: Strength & Muscle Tone: within normal limits- no tremors, no diaphoresis Gait & Station: normal Patient leans: N/A   Psychiatric Specialty Exam: Physical Exam  Review of Systems  Constitutional: Negative for fever and chills.  Respiratory: Negative for cough and shortness of breath.   Cardiovascular: Negative for chest pain.  Gastrointestinal: Negative for nausea and vomiting.  Genitourinary: Negative for dysuria, urgency and frequency.  Skin: Negative for rash.  Neurological: Negative for seizures and headaches.  Psychiatric/Behavioral: Positive for substance abuse.    Blood  pressure 92/40, pulse 75, temperature 98.4 F (36.9 C), temperature source Oral, resp. rate 16, height _0  (1.676 m), weight 230 lb (104.327 kg).Body mass index is 37.14 kg/(m^2).  General Appearance: Fairly Groomed  Engineer, water::  Good  Speech:  Normal Rate  Volume:  Normal  Mood:  denies depression, describes moood as "OK"  Affect:  constricted but reactive  Thought Process:  Goal Directed and Linear  Orientation:  Other:  fully alert and attentive  Thought Content:  denies hallucinations, no delusions, does not appear internally preoccupied  Suicidal Thoughts:  No  at this time denies any thoughts of hurting self and  contracts for safety on unit   Homicidal Thoughts:  No  Memory:  Recent and Remote grossly intact  Judgement:  Fair  Insight:  Fair  Psychomotor Activity:  Normal  Concentration:  Good  Recall:  Good  Fund of Knowledge:Good  Language: Good  Akathisia:  Negative  Handed:  Right  AIMS (if indicated):     Assets:  Desire for Improvement Resilience  ADL's:  improved  Cognition: alert, attentive, oriented   Sleep:        Current Medications: Current Facility-Administered Medications  Medication Dose Route Frequency Provider Last Rate Last Dose  . acetaminophen (TYLENOL) tablet 650 mg  650 mg Oral Q6H PRN Laverle Hobby, PA-C   650 mg at 12/03/14 1200  . alum & mag hydroxide-simeth (MAALOX/MYLANTA) 200-200-20 MG/5ML suspension 30 mL  30 mL Oral Q4H PRN Laverle Hobby, PA-C      . chlordiazePOXIDE (LIBRIUM) capsule 25 mg  25 mg Oral Q6H PRN Laverle Hobby, PA-C      . chlordiazePOXIDE (LIBRIUM) capsule 25 mg  25 mg Oral TID Laverle Hobby, PA-C   25 mg at 12/04/14 1213   Followed by  . [START ON 12/05/2014] chlordiazePOXIDE (LIBRIUM) capsule 25 mg  25 mg Oral BH-qamhs Spencer E Simon, PA-C       Followed by  . [START ON 12/06/2014] chlordiazePOXIDE (LIBRIUM) capsule 25 mg  25 mg Oral Daily Laverle Hobby, PA-C      . cloNIDine (CATAPRES) tablet 0.1 mg  0.1 mg Oral BID Laverle Hobby, PA-C   0.1 mg at 12/04/14 0819  . DULoxetine (CYMBALTA) DR capsule 20 mg  20 mg Oral Daily Janett Labella, NP   20 mg at 12/04/14 8185  . hydrOXYzine (ATARAX/VISTARIL) tablet 25 mg  25 mg Oral Q6H PRN Laverle Hobby, PA-C   25 mg at 12/04/14 6314  . ibuprofen (ADVIL,MOTRIN) tablet 600 mg  600 mg Oral Q6H PRN Laverle Hobby, PA-C   600 mg at 12/04/14 9702  . lisinopril (PRINIVIL,ZESTRIL) tablet 5 mg  5 mg Oral Daily Laverle Hobby, PA-C   5 mg at 12/04/14 0820  . loperamide (IMODIUM) capsule 2-4 mg  2-4 mg Oral PRN Laverle Hobby, PA-C      .  magnesium hydroxide (MILK OF MAGNESIA) suspension 30 mL  30 mL Oral Daily PRN Laverle Hobby, PA-C      . multivitamin with minerals tablet 1 tablet  1 tablet Oral Daily Laverle Hobby, PA-C   1 tablet at 12/04/14 0820  . ondansetron (ZOFRAN-ODT) disintegrating tablet 4 mg  4 mg Oral Q6H PRN Laverle Hobby, PA-C      . QUEtiapine (SEROQUEL) tablet 25 mg  25 mg Oral QHS Janett Labella, NP   25 mg at 12/03/14 2225  . thiamine (VITAMIN B-1) tablet  100 mg  100 mg Oral Daily Laverle Hobby, PA-C   100 mg at 12/04/14 5366    Lab Results:  Results for orders placed or performed during the hospital encounter of 12/03/14 (from the past 48 hour(s))  Lipid panel     Status: Abnormal   Collection Time: 12/04/14  6:30 AM  Result Value Ref Range   Cholesterol 216 (H) 0 - 200 mg/dL   Triglycerides 227 (H) <150 mg/dL   HDL 51 >39 mg/dL   Total CHOL/HDL Ratio 4.2 RATIO   VLDL 45 (H) 0 - 40 mg/dL   LDL Cholesterol 120 (H) 0 - 99 mg/dL    Comment:        Total Cholesterol/HDL:CHD Risk Coronary Heart Disease Risk Table                     Men   Women  1/2 Average Risk   3.4   3.3  Average Risk       5.0   4.4  2 X Average Risk   9.6   7.1  3 X Average Risk  23.4   11.0        Use the calculated Patient Ratio above and the CHD Risk Table to determine the patient's CHD Risk.        ATP III CLASSIFICATION (LDL):  <100     mg/dL   Optimal  100-129  mg/dL   Near or Above                    Optimal  130-159  mg/dL   Borderline  160-189  mg/dL   High  >190     mg/dL   Very High Performed at [facility]   Hemoglobin A1c     Status: None   Collection Time: 12/04/14  6:30 AM  Result Value Ref Range   Hgb A1c MFr Bld 5.6 4.8 - 5.6 %    Comment: (NOTE)         Pre-diabetes: 5.7 - 6.4         Diabetes: >6.4         Glycemic control for adults with diabetes: <7.0    Mean Plasma Glucose 114 mg/dL    Comment: (NOTE) Performed At: [facility] [place], [place]  [id number] Lindon Romp MD ZD:[id number] Performed at [facility]   TSH     Status: None   Collection Time: 12/04/14  6:30 AM  Result Value Ref Range   TSH 1.021 0.350 - 4.500 uIU/mL    Comment: Performed at [facility]  Hepatic function panel     Status: Abnormal   Collection Time: 12/04/14  6:30 AM  Result Value Ref Range   Total Protein 6.4 6.0 - 8.3 g/dL   Albumin 3.7 3.5 - 5.2 g/dL   AST 24 0 - 37 U/L   ALT 23 0 - 35 U/L   Alkaline Phosphatase 120 (H) 39 - 117 U/L   Total Bilirubin 0.6 0.3 - 1.2 mg/dL   Bilirubin, Direct <0.1 0.0 - 0.5 mg/dL   Indirect Bilirubin NOT CALCULATED 0.3 - 0.9 mg/dL    Comment: Performed at [facility]    Physical Findings: AIMS: Facial and Oral Movements Muscles of Facial Expression: None, normal Lips and Perioral Area: None, normal Jaw: None, normal Tongue: None, normal,Extremity Movements Upper (arms, wrists, hands, fingers): None, normal Lower (legs, knees, ankles, toes): None, normal,  Trunk Movements Neck, shoulders, hips: None, normal, Overall Severity Severity of abnormal movements (highest score from questions above): None, normal Incapacitation due to abnormal movements: None, normal Patient's awareness of abnormal movements (rate only patient's report): No Awareness, Dental Status Current problems with teeth and/or dentures?: Yes (missing) Does patient usually wear dentures?: No  CIWA:  CIWA-Ar Total: 1 COWS:  COWS Total Score: 2  Assessment- at this time patient is reporting feeling better, minimizes depression, and is hoping for discharge soon. She appears to have limited insight regarding alcohol abuse / use disorder, although did seem more amenable to discuss this issue after we reviewed her BAL at admission and likelihood alcohol use contributed to events leading to admission. She is not presenting with any current withdrawal symptoms. Tolerating medications well.  Treatment  Plan Summary: Daily contact with patient to assess and evaluate symptoms and progress in treatment, Medication management, Plan Continue inpatient treatment as below and continue medication management.  Continue with Librium detox protocol to minimize risk of ETOH WDL. Continue Cymbalta 20 mgrs QDAY  Continue Seroquel 25 mgrs QHS    Medical Decision Making:  Established Problem, Stable/Improving (1), Review of Psycho-Social Stressors (1), Review of Last Therapy Session (1), Review or order medicine tests (1) and Review of Medication Regimen & Side Effects (2)     Zniyah Midkiff 12/04/2014, 4:03 PM

## 2014-12-04 NOTE — Progress Notes (Signed)
D:  Patient's self inventory sheet, patient sleeps good last night.  Sleep medication was helpful.  Good appetite, low energy level, good concentration.  Denied depression, hopeless, anxiety.  Denied withdrawals.  Denied withdrawals.  Denied SI.  Physical problems, back pain, worst pain #10.  Pain medication is helpful.  Goal is "to try to help myself do better.  Plans to listen more, go to groups."  Plans to discharge home.  No problems anticipated after discharge. A:  Medications administered per MD orders.  Emotional support and encouragement given patient. R:  Denied SI and HI, contracts for safety.  Denied A/V hallucinations.   Safety maintained with 15 minute checks.

## 2014-12-04 NOTE — Plan of Care (Signed)
Problem: Consults Goal: Depression Patient Education See Patient Education Module for education specifics.  Outcome: Completed/Met Date Met:  12/04/14 Nurse discussed depression with patient.

## 2014-12-04 NOTE — [facility] Group Notes (Signed)
[facility] LCSW Group Therapy  12/04/2014 2:34 PM  Type of Therapy:  Group Therapy  Participation Level:  Minimal  Participation Quality:  Drowsy  Affect:  Depressed  Cognitive:  Appropriate  Insight:  Developing/Improving  Engagement in Therapy:  Developing/Improving  Modes of Intervention:  Discussion, Education, Exploration, Problem-solving, Rapport Building and Support  Summary of Progress/Problems:Patient was drowsy throughout the presentation.  She later advised MD made changes in her medications.  Wynn BankerHodnett, Yusef Lamp Hairston 12/04/2014, 2:34 PM

## 2014-12-05 DIAGNOSIS — F1010 Alcohol abuse, uncomplicated: Secondary | ICD-10-CM

## 2014-12-05 DIAGNOSIS — F1094 Alcohol use, unspecified with alcohol-induced mood disorder: Secondary | ICD-10-CM

## 2014-12-05 MED ORDER — QUETIAPINE FUMARATE 25 MG PO TABS: 25.0000 mg | ORAL_TABLET | Freq: Every day | ORAL | Status: DC

## 2014-12-05 MED ORDER — DULOXETINE HCL 20 MG PO CPEP: 20.0000 mg | ORAL_CAPSULE | Freq: Every day | ORAL | Status: DC

## 2014-12-05 MED ORDER — ADULT MULTIVITAMIN W/MINERALS CH: 1.0000 | ORAL_TABLET | Freq: Every day | ORAL | Status: DC

## 2014-12-05 MED ORDER — CLONIDINE HCL 0.1 MG PO TABS: 0.1000 mg | ORAL_TABLET | Freq: Two times a day (BID) | ORAL | Status: DC

## 2014-12-05 MED ORDER — LISINOPRIL 5 MG PO TABS
5.0000 mg | ORAL_TABLET | Freq: Every day | ORAL | Status: DC
Start: 2014-12-05 — End: 2022-02-23

## 2014-12-05 NOTE — [facility] Suicide Risk Assessment (Signed)
[facility] Discharge Suicide Risk Assessment   Demographic Factors:  69 year old female, widowed, lives alone, has supportive adult daughter  Total Time spent with patient: 30 minutes  Musculoskeletal: Strength & Muscle Tone: within normal limits Gait & Station: normal Patient leans: N/A  Psychiatric Specialty Exam: Physical Exam  ROS  Blood pressure 141/60, pulse 85, temperature 98.3 F (36.8 C), temperature source Oral, resp. rate 20, height  (1.676 m), weight 230 lb (104.327 kg).Body mass index is 37.14 kg/(m^2).  General Appearance: improved grooming  Eye Contact::  Good  Speech:  Normal Rate409  Volume:  Normal  Mood:  denies depression, states mood is "OK"  Affect:  improved , more reactive  Thought Process:  Goal Directed and Linear  Orientation:  Full (Time, Place, and Person)  Thought Content:  linear, well organized  Suicidal Thoughts:  No  Homicidal Thoughts:  No  Memory: recent and remote grossly intact   Judgement:  Other:  improved  Insight:  improving  Psychomotor Activity:  Normal  Concentration:  Good  Recall:  Good  Fund of Knowledge:Good  Language: Good  Akathisia:  Negative  Handed:  Right  AIMS (if indicated):     Assets:  Desire for Improvement Housing Social Support  Sleep:  Number of Hours: 6.75  Cognition: WNL  ADL's:  Improved    Have you used any form of tobacco in the last 30 days? (Cigarettes, Smokeless Tobacco, Cigars, and/or Pipes): No  Has this patient used any form of tobacco in the last 30 days? (Cigarettes, Smokeless Tobacco, Cigars, and/or Pipes) No  Mental Status Per Nursing Assessment::   On Admission:     Current Mental Status by Physician: At this time patient is improved. She is denying depression, affect is more reactive, no thought disorder, no SI, no HI, no psychotic symptoms. No withdrawal symptoms. Vitals are stable.  Loss Factors: Boyfriend went to prison, resulting in increased loneliness and night time  drinking.  Historical Factors: No prior psychiatric admissions, no history of suicide attempts, history of alcohol abuse .  Risk Reduction Factors:   Sense of responsibility to family, Positive social support and Positive coping skills or problem solving skills  Continued Clinical Symptoms:  As noted above, patient much improved, at this time no SI or HI, no psychotic symptoms, future oriented, looking forward to going home. Denies medication side effects. No symptoms of alcohol withdrawal at present.  Cognitive Features That Contribute To Risk:  No gross cognitive deficits noted upon discharge. Is alert , attentive, and oriented x 3   Suicide Risk:  Mild:  Suicidal ideation of limited frequency, intensity, duration, and specificity.  There are no identifiable plans, no associated intent, mild dysphoria and related symptoms, good self-control (both objective and subjective assessment), few other risk factors, and identifiable protective factors, including available and accessible social support.  Principal Problem: Alcohol-induced mood disorder Discharge Diagnoses:  Patient Active Problem List   Diagnosis Date Noted  . 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 Alcohol abuse [F10.10]   . Chest pain [R07.9] 10/18/2013  . Abnormal ECG [R94.31] 10/18/2013  . Flank pain [R10.9] 10/18/2013  . Obesity [E66.9]   . Bipolar 1 disorder [F31.9]   . Hypertension [I10]   . Accidental acetaminophen overdose [T39.1X1A] 04/17/2012    Class: Acute    Follow-up Information    Follow up with Dr. Michae Kava - [facility] On 12/25/2014.   Why:  You are scheduled with Dr. Michae Kava on Thursday, December 25, 2014 at [facility]PM.  Please bring completed registration form   Contact information:   [address] [place], [place]   [address]  [phone]      Plan Of Care/Follow-up recommendations:  Activity:  As tolerated Diet:  Low sodium, heart healthy Tests:  NA Other:  See below  Is  patient on multiple antipsychotic therapies at discharge:  No   Has Patient had three or more failed trials of antipsychotic monotherapy by history:  No  Recommended Plan for Multiple Antipsychotic Therapies: NA  Patient is leaving unit in good spirits. Plans to return home.  Patient encouraged to follow up with PCP for medical care and follow ups as needed . Patient encouraged and advised to stop drinking , consider going to [employer] meetings.    Imogene Gravelle 12/05/2014, 10:14 AM

## 2014-12-05 NOTE — Tx Team (Signed)
Interdisciplinary Treatment Plan Update   Date Reviewed:  12/05/2014  Time Reviewed:  10:20 AM  Progress in Treatment:   Attending groups: Yes, patient has attended groups. Participating in groups: Yes, patient has engaged in discussion. Taking medication as prescribed: Yes  Tolerating medication: Yes Family/Significant other contact made: Yes, collateral contact with daughter. Patient understands diagnosis: Yes, patient understands diagnosis Discussing patient identified problems/goals with staff: Yes, patient is able to express goals for treatment and discharge. Medical problems stabilized or resolved: Yes Denies suicidal/homicidal ideation: Yes Patient has not harmed self or others: Yes  For review of initial/current patient goals, please see plan of care.  Estimated Length of Stay:  Discharge today  Reasons for Continued Hospitalization:   New Problems/Goals identified:    Discharge Plan or Barriers:   Home with [facility]  Additional Comments:  Patient and CSW reviewed patient's identified goals and treatment plan.  Patient verbalized understanding and agreed to treatment plan.   Attendees:  Patient:  12/05/2014 10:20 AM   Signature:  Sallyanne HaversF. Cobos, MD 12/05/2014 10:20 AM  Signature: Geoffery LyonsIrving Lugo, MD 12/05/2014 10:20 AM  Signature: Merian CapronMarian Friedman, RN 12/05/2014 10:20 AM  Signature: 12/05/2014 10:20 AM  Signature:   12/05/2014 10:20 AM  Signature:  Juline PatchQuylle Aniko Finnigan, LCSW 12/05/2014 10:20 AM  Signature:  Belenda CruiseKristin Drinkard, LCSW-A 12/05/2014 10:20 AM  Signature:  Leisa LenzValerie Enoch, Care Coordinator [facility] 12/05/2014 10:20 AM  Signature:   12/05/2014 10:20 AM  Signature:  12/05/2014  10:20 AM  Signature:   Onnie BoerJennifer Clark, RN URCM 12/05/2014  10:20 AM  Signature:  12/05/2014  10:20 AM    Scribe for Treatment Team:   Juline PatchQuylle Severa Jeremiah,  12/05/2014 10:20 AM

## 2014-12-05 NOTE — Discharge Summary (Signed)
Physician Discharge Summary Note  Patient:  Christie Meza is an 69 y.o., female MRN:  [id number] DOB:  03-Jul-1946 Patient phone:  [phone] (home)  Patient address:   [address] [place] [address],  Total Time spent with patient: 30 minutes  Date of Admission:  12/03/2014 Date of Discharge: 12/05/14  Reason for Admission:  Mood stabilization treatments   Principal Problem: Alcohol-induced mood disorder Discharge Diagnoses: Patient Active Problem List   Diagnosis Date Noted  . 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 Alcohol abuse [F10.10]   . Chest pain [R07.9] 10/18/2013  . Abnormal ECG [R94.31] 10/18/2013  . Flank pain [R10.9] 10/18/2013  . Obesity [E66.9]   . Bipolar 1 disorder [F31.9]   . Hypertension [I10]   . Accidental acetaminophen overdose [T39.1X1A] 04/17/2012    Class: Acute    Musculoskeletal: Strength & Muscle Tone: within normal limits Gait & Station: normal Patient leans: N/A  Psychiatric Specialty Exam: Physical Exam  Review of Systems  Constitutional: Negative.   HENT: Negative.   Eyes: Negative.   Respiratory: Negative.   Cardiovascular: Negative.   Gastrointestinal: Negative.   Genitourinary: Negative.   Musculoskeletal: Negative.   Skin: Negative.   Neurological: Negative.   Endo/Heme/Allergies: Negative.   Psychiatric/Behavioral: Positive for substance abuse (Alcohol abuse prior to admission ). Negative for depression, suicidal ideas, hallucinations and memory loss. The patient has insomnia (Stabilized with treatments ). The patient is not nervous/anxious.     Blood pressure 141/60, pulse 85, temperature 98.3 F (36.8 C), temperature source Oral, resp. rate 20, height 5\' 6"  (1.676 m), weight 104.327 kg (230 lb).Body mass index is 37.14 kg/(m^2).  See Physician SRA       Past Medical History:  Past Medical History  Diagnosis Date  . Hypertension   . Bipolar 1 disorder   . Obesity     Past Surgical History   Procedure Laterality Date  . Abdominal hysterectomy  ~2002   Family History:  Family History  Problem Relation Age of Onset  . Kidney disease Mother   . Hypertension Mother   . Kidney disease Brother    Social History:  History  Alcohol Use  . 0.0 oz/week     History  Drug Use  . Yes    History   Social History  . Marital Status: Single    Spouse Name: N/A  . Number of Children: N/A  . Years of Education: N/A   Occupational History  . disability     mental health    Social History Main Topics  . Smoking status: Never Smoker   . Smokeless tobacco: Never Used  . Alcohol Use: 0.0 oz/week  . Drug Use: Yes  . Sexual Activity: No   Other Topics Concern  . None   Social History Narrative   She lives in [place]. She has two kids. She is not married.  She is on disability for bipolar     Past Psychiatric History: Hospitalizations:  Outpatient Care:  Substance Abuse Care:  Self-Mutilation:  Suicidal Attempts:  Violent Behaviors:   Risk to Self: Is patient at risk for suicide?: No What has been your use of drugs/alcohol within the last 12 months?: Patient reports drinking at least four ounces of alcohol nightly to sleep Risk to Others:   Prior Inpatient Therapy:   Prior Outpatient Therapy:    Level of Care:  OP  Hospital Course:    Christie Meza is an African-American, single, 69 y.o. female who presents to [facility] via  EMS under IVC due to suicidal ideation with plan. EMS reported that pt's house alarm was going off and that when they arrived on scene, pt was intoxicated and stated that she wanted someone to shoot her in the head; therefore, [employer] took out IVC papers on pt. Pt presents for assessment with slightly irritable affect, poor eye-contact, and slow speech. Thought process is relevant and linear. Speech is coherent with no evidence of delusional content. Appearance is unremarkable. Pt does not appear to be responding to any internal stimuli. Pt is oriented  x4 but is drowsy and keeps her eyes closed for much of the interview. Pt reports that she is "just tired of living" and has been having SI yesterday and today, but she does not express any intent to counselor. Pt reports a long hx of depression and 2 prior suicide attempts. She states that her depression worsened in Nov 2015 when her boyfriend went to jail and that is when she began to drink heavily -- about 1 pint of liquor per day. She denied use of any other substances to counselor. UDS was negative and BAL was 247 at 01:00. Pt endorses depressed mood, feelings of hopelessness, insomnia, crying spells, social isolation, loss of pleasure, decreased appetite, and suicidal thoughts. Pt has a hx of Bipolar I diagnosis and endorses some hypomanic sx, such as decreased need for sleep and impulsivity. She also says that she struggles with anxiety and nervousness at times, which leads to panic attacks. Pt does not currently see a psychiatrist or therapist but says that she did many years ago. Pt says she also used to take anti-depressants many years ago but stopped taking those as well. She has at least one prior psychiatric hospitalization for SI and depression in [place] 10-15 years ago. Pt denies A/VH or HI. She says she has only experienced homicidal thoughts one time in her life, years ago. She denies access to weapons. She denies any hx of abuse or trauma.         Christie Meza was admitted to the adult unit where she was evaluated and her symptoms were identified. Medication management was discussed and implemented. The patient was not listed as taking any psychiatric medications prior to her admission. Patient was started on Cymbalta 20 mg daily for depression and Seroquel 25 mg at hs to help with sleep. The patient was started on Librium protocol to minimize the risk of alcohol withdrawal. She was encouraged to participate in unit programming. Medical problems were identified and treated appropriately.  Patient was started on Clonidine and Lisinopril after her blood pressures were noted to be quite elevated. Home medication was restarted as needed. She was evaluated each day by a clinical provider to ascertain the patient's response to treatment.  Improvement was noted by the patient's report of decreasing symptoms, improved sleep and appetite, affect, medication tolerance, behavior, and participation in unit programming.  The patient was asked each day to complete a self inventory noting mood, mental status, pain, new symptoms, anxiety and concerns.         She responded well to medication and being in a therapeutic and supportive environment. Positive and appropriate behavior was noted and the patient was motivated for recovery.  She worked closely with the treatment team and case manager to develop a discharge plan with appropriate goals. Coping skills, problem solving as well as relaxation therapies were also part of the unit programming. Patient was noted to be fixated on discharge and appeared to minimize  her problems with alcohol consumption to assist with sleep. The patient expressed that her nightly drinking was not a problem from her perspective. However, she was very surprised to learn how high her blood alcohol level was on admission.          By the day of discharge she was in much improved condition than upon admission.  Symptoms were reported as significantly decreased or resolved completely. The patient denied SI/HI and voiced no AVH. She was motivated to continue taking medication with a goal of continued improvement in mental health. Christie Meza was discharged home with a plan to follow up as noted below. The patient was provided with sample medications and prescriptions at time of discharge. She left [facility] in stable condition with all belongings returned to her. Patient was advised by treatment team members to stop drinking and consider going to [employer]   Consults:  None  Significant  Diagnostic Studies:  Chemistry panel, CBC, Lipid panel, Hemoglobin A1c, TSH, UDS negative, UA, Alcohol level 247 on admission   Discharge Vitals:   Blood pressure 141/60, pulse 85, temperature 98.3 F (36.8 C), temperature source Oral, resp. rate 20, height  (1.676 m), weight 104.327 kg (230 lb). Body mass index is 37.14 kg/(m^2). Lab Results:   Results for orders placed or performed during the hospital encounter of 12/03/14 (from the past 72 hour(s))  Lipid panel     Status: Abnormal   Collection Time: 12/04/14  6:30 AM  Result Value Ref Range   Cholesterol 216 (H) 0 - 200 mg/dL   Triglycerides 161 (H) <150 mg/dL   HDL 51 >09 mg/dL   Total CHOL/HDL Ratio 4.2 RATIO   VLDL 45 (H) 0 - 40 mg/dL   LDL Cholesterol 604 (H) 0 - 99 mg/dL    Comment:        Total Cholesterol/HDL:CHD Risk Coronary Heart Disease Risk Table                     Men   Women  1/2 Average Risk   3.4   3.3  Average Risk       5.0   4.4  2 X Average Risk   9.6   7.1  3 X Average Risk  23.4   11.0        Use the calculated Patient Ratio above and the CHD Risk Table to determine the patient's CHD Risk.        ATP III CLASSIFICATION (LDL):  <100     mg/dL   Optimal  540-981  mg/dL   Near or Above                    Optimal  130-159  mg/dL   Borderline  191-478  mg/dL   High  >295     mg/dL   Very High Performed at [facility]   Hemoglobin A1c     Status: None   Collection Time: 12/04/14  6:30 AM  Result Value Ref Range   Hgb A1c MFr Bld 5.6 4.8 - 5.6 %    Comment: (NOTE)         Pre-diabetes: 5.7 - 6.4         Diabetes: >6.4         Glycemic control for adults with diabetes: <7.0    Mean Plasma Glucose 114 mg/dL    Comment: (NOTE) Performed At: [facility] [address] [place], [place] [id number] Marcellus Scott  F MD ZO:[phone] Performed at [facility]   TSH     Status: None   Collection Time: 12/04/14  6:30 AM  Result Value Ref Range   TSH 1.021 0.350  - 4.500 uIU/mL    Comment: Performed at [facility]  Hepatic function panel     Status: Abnormal   Collection Time: 12/04/14  6:30 AM  Result Value Ref Range   Total Protein 6.4 6.0 - 8.3 g/dL   Albumin 3.7 3.5 - 5.2 g/dL   AST 24 0 - 37 U/L   ALT 23 0 - 35 U/L   Alkaline Phosphatase 120 (H) 39 - 117 U/L   Total Bilirubin 0.6 0.3 - 1.2 mg/dL   Bilirubin, Direct <8.1<0.1 0.0 - 0.5 mg/dL   Indirect Bilirubin NOT CALCULATED 0.3 - 0.9 mg/dL    Comment: Performed at [facility]    Physical Findings: AIMS: Facial and Oral Movements Muscles of Facial Expression: None, normal Lips and Perioral Area: None, normal Jaw: None, normal Tongue: None, normal,Extremity Movements Upper (arms, wrists, hands, fingers): None, normal Lower (legs, knees, ankles, toes): None, normal, Trunk Movements Neck, shoulders, hips: None, normal, Overall Severity Severity of abnormal movements (highest score from questions above): None, normal Incapacitation due to abnormal movements: None, normal Patient's awareness of abnormal movements (rate only patient's report): No Awareness, Dental Status Current problems with teeth and/or dentures?: Yes Does patient usually wear dentures?: No  CIWA:  CIWA-Ar Total: 0 COWS:  COWS Total Score: 1   See Psychiatric Specialty Exam and Suicide Risk Assessment completed by Attending Physician prior to discharge.  Discharge destination:  Home  Is patient on multiple antipsychotic therapies at discharge:  No   Has Patient had three or more failed trials of antipsychotic monotherapy by history:  No  Recommended Plan for Multiple Antipsychotic Therapies: NA      Discharge Instructions    Discharge instructions    Complete by:  As directed   Please follow up with your Primary Care Provider as soon as possible for further management of Hypertension and also for suggested vitamin supplementation dosages. You were provided with a prescription for thirty  days of Lisinopril and Clonidine for elevated blood pressure.            Medication List    STOP taking these medications        CALCIUM PO     cephALEXin 500 MG capsule  Commonly known as:  KEFLEX     fish oil-omega-3 fatty acids 1000 MG capsule     naproxen sodium 220 MG tablet  Commonly known as:  ANAPROX     VITAMIN A PO     VITAMIN C PO     VITAMIN D PO      TAKE these medications      Indication   cloNIDine 0.1 MG tablet  Commonly known as:  CATAPRES  Take 1 tablet (0.1 mg total) by mouth 2 (two) times daily.   Indication:  High Blood Pressure     DULoxetine 20 MG capsule  Commonly known as:  CYMBALTA  Take 1 capsule (20 mg total) by mouth daily.   Indication:  Generalized Anxiety Disorder, Major Depressive Disorder, Musculoskeletal Pain     ibuprofen 200 MG tablet  Commonly known as:  ADVIL,MOTRIN  Take 600 mg by mouth every 6 (six) hours as needed (For pain.).      lisinopril 5 MG tablet  Commonly known as:  PRINIVIL,ZESTRIL  Take  1 tablet (5 mg total) by mouth daily.   Indication:  High Blood Pressure     multivitamin with minerals Tabs tablet  Take 1 tablet by mouth daily.   Indication:  Vitamin Supplementation     QUEtiapine 25 MG tablet  Commonly known as:  SEROQUEL  Take 1 tablet (25 mg total) by mouth at bedtime.   Indication:  Trouble Sleeping, mood stabilization       Follow-up Information    Follow up with Dr. Michae Kava - [facility] On 12/25/2014.   Why:  You are scheduled with Dr. Michae Kava on Thursday, December 25, 2014 at [facility].  Please bring completed registration form   Contact information:   [address] [place], [place]   [address]  [phone]      Follow-up recommendations:   Activity: As tolerated Diet: Low sodium, heart healthy Tests: NA Other: See below  Comments:   Take all your medications as prescribed by your mental healthcare provider.  Report any adverse effects and or reactions from your medicines  to your outpatient provider promptly.  Patient is instructed and cautioned to not engage in alcohol and or illegal drug use while on prescription medicines.  In the event of worsening symptoms, patient is instructed to call the crisis hotline, 911 and or go to the nearest ED for appropriate evaluation and treatment of symptoms.  Follow-up with your primary care provider for your other medical issues, concerns and or health care needs.   Total Discharge Time: Greater than 30 minutes   Signed: DAVIS, LAURA NP-C 12/05/2014, 5:09 PM  Patient seen, Suicide Assessment Completed.  Disposition Plan Reviewed

## 2014-12-05 NOTE — Progress Notes (Signed)
Patient ID: Christie Meza, female   DOB: 12-04-1945, 69 y.o.   MRN: [id number]  D: Patient reports that she is ready for discharge. Denies any SI at present. Her plan is to take meds and not come back. No withdrawal symptoms noted. Treatment team felt patient could be discharged. A: Obtained all belongings, prescriptions, sample meds, and follow-up appointment. R: Nephew here to pick patient up.

## 2014-12-05 NOTE — Progress Notes (Signed)
  [facility] :  Will you be returning to the same living situation after discharge:  Yes,  Patient is returning to her home. At discharge, do you have transportation home?: Yes,  Patient is arranging transportation home. Do you have the ability to pay for your medications: Yes,  Patient is able to obtain medications.  Release of information consent forms completed and in the chart;  Patient's signature needed at discharge.  Patient to Follow up at: Follow-up Information    Follow up with Dr. [facility]Agarwal [facility] On 12/25/2014.   Why:  You are scheduled with Dr. Michae KavaAgarwal on Thursday, December 25, 2014 at [facility].  Please bring completed registration form   Contact information:   [address] [place], [place]   [id number]  [phone]      Patient denies SI/HI:   Patient no longer endorsing SI/HI or other thoughts of self harm.  Safety Planning and Suicide Prevention discussed: .Reviewed with all patients during discharge planning group   Have you used any form of tobacco in the last 30 days? (Cigarettes, Smokeless Tobacco, Cigars, and/or Pipes): No  Has patient been referred to the Quitline?: No referral needed to quitline.  Wynn BankerHodnett, Christie Meza 12/05/2014, 12:42 PM

## 2014-12-05 NOTE — [facility] Group Notes (Signed)
[facility] Discharge Planning [facility] Note   12/05/2014 10:20 AM  Participation Quality:  Did not attend.   Hyatt,Candace

## 2014-12-10 NOTE — Progress Notes (Signed)
Patient Discharge Instructions:  Next Level Care Provider Has Access to the EMR, 12/10/14  Records provided to [facility] via [facility]/Epic access.  Jerelene ReddenSheena E Denison, 12/10/2014, 2:03 PM

## 2014-12-18 NOTE — ED Provider Notes (Signed)
CSN: [id number]     Arrival date & time    History   None    No chief complaint on file.    (Consider location/radiation/quality/duration/timing/severity/associated sxs/prior Treatment) HPI   Kittie Krizan is a 69 y.o. female who presenting with SI with a plan. EMS reported that pt's house alarm was going off and that when they arrived on scene, pt was intoxicated and stated that she wanted someone to shoot her in the head.  She admits to being intoxicated with alcohol currently.  She also states she wanted to drink herself to death. Pt reports that she is "just tired of living" and has been having SI yesterday and today Pt reports a long hx of depression and 2 prior suicide attempts. She denies any other drug use, or hallucinations.  Patient has no medical complaints including fevers, recent infections, chest pain or SOB.  10 Systems reviewed and are negative for acute change except as noted in the HPI.   Past Medical History  Diagnosis Date  . Hypertension   . Bipolar 1 disorder   . Obesity    Past Surgical History  Procedure Laterality Date  . Abdominal hysterectomy  ~2002   Family History  Problem Relation Age of Onset  . Kidney disease Mother   . Hypertension Mother   . Kidney disease Brother    History  Substance Use Topics  . Smoking status: Never Smoker   . Smokeless tobacco: Never Used  . Alcohol Use: 0.0 oz/week   OB History    No data available     Review of Systems    Allergies  Aspirin  Home Medications   Prior to Admission medications   Medication Sig Start Date End Date Taking? Authorizing Provider  ibuprofen (ADVIL,MOTRIN) 200 MG tablet Take 600 mg by mouth every 6 (six) hours as needed (For pain.).   Yes Historical Provider, MD  cloNIDine (CATAPRES) 0.1 MG tablet Take 1 tablet (0.1 mg total) by mouth 2 (two) times daily. 12/05/14   Fransisca Kaufmann, NP  DULoxetine (CYMBALTA) 20 MG capsule Take 1 capsule (20 mg total) by mouth daily. 12/05/14   Fransisca Kaufmann, NP  lisinopril (PRINIVIL,ZESTRIL) 5 MG tablet Take 1 tablet (5 mg total) by mouth daily. 12/05/14   Fransisca Kaufmann, NP  Multiple Vitamin (MULTIVITAMIN WITH MINERALS) TABS tablet Take 1 tablet by mouth daily. 12/05/14   Fransisca Kaufmann, NP  QUEtiapine (SEROQUEL) 25 MG tablet Take 1 tablet (25 mg total) by mouth at bedtime. 12/05/14   Fransisca Kaufmann, NP   BP 141/60 mmHg  Pulse 85  Temp(Src) 98.3 F (36.8 C) (Oral)  Resp 20  Ht  (1.676 m)  Wt 230 lb (104.327 kg)  BMI 37.14 kg/m2 Physical Exam  Constitutional: She is oriented to person, place, and time. She appears well-developed and well-nourished. No distress.  Clinically intoxicated obese female  HENT:  Head: Normocephalic and atraumatic.  Nose: Nose normal.  Mouth/Throat: Oropharynx is clear and moist. No oropharyngeal exudate.  Eyes: Conjunctivae and EOM are normal. Pupils are equal, round, and reactive to light. No scleral icterus.  Neck: Normal range of motion. Neck supple. No JVD present. No tracheal deviation present. No thyromegaly present.  Cardiovascular: Normal rate, regular rhythm and normal heart sounds.  Exam reveals no gallop and no friction rub.   No murmur heard. Pulmonary/Chest: Effort normal and breath sounds normal. No respiratory distress. She has no wheezes. She exhibits no tenderness.  Abdominal: Soft. Bowel sounds are normal. She exhibits  no distension and no mass. There is no tenderness. There is no rebound and no guarding.  Musculoskeletal: Normal range of motion. She exhibits no edema or tenderness.  Lymphadenopathy:    She has no cervical adenopathy.  Neurological: She is alert and oriented to person, place, and time. No cranial nerve deficit. She exhibits normal muscle tone.  Normal strength and sensation x 4 ext.  Skin: Skin is warm and dry. No rash noted. No erythema. No pallor.  Nursing note and vitals reviewed.   ED Course  Procedures (including critical care time) Labs Review Labs Reviewed  LIPID  PANEL - Abnormal; Notable for the following:    Cholesterol 216 (*)    Triglycerides 227 (*)    VLDL 45 (*)    LDL Cholesterol 120 (*)    All other components within normal limits  HEPATIC FUNCTION PANEL - Abnormal; Notable for the following:    Alkaline Phosphatase 120 (*)    All other components within normal limits  HEMOGLOBIN A1C  TSH    Imaging Review No results found.   EKG Interpretation None      MDM   Final diagnoses:  None    Patient presents with obvious SI and a plan to kill herself.  I am concerned about this patient.  Will be placed on 1:1 and TTS consult for inpatient admission.  She is medically cleared.   Tomasita CrumbleAdeleke Savvy Peeters, MD 12/18/14 [phone]

## 2014-12-25 ENCOUNTER — Ambulatory Visit (HOSPITAL_COMMUNITY): Payer: Self-pay | Admitting: Psychiatry

## 2014-12-30 NOTE — ED Provider Notes (Signed)
CSN: [id number]     Arrival date & time 12/02/14  2340 History   First MD Initiated Contact with Patient 12/03/14 0222     Chief Complaint  Patient presents with  . Suicidal  . Alcohol Intoxication     (Consider location/radiation/quality/duration/timing/severity/associated sxs/prior Treatment) HPI   MS. Christie Meza presents for SI. States she attempted to drink a lot of alcohol to kill herself and she will try again if discharged.  She was brought in by police because her house alarm was going off.  Patient also states she wants someone to shoot her in the head.  She tells me she has a lot of life stress going on currently.    Past Medical History  Diagnosis Date  . Hypertension   . Bipolar 1 disorder   . Obesity    Past Surgical History  Procedure Laterality Date  . Abdominal hysterectomy  ~2002   Family History  Problem Relation Age of Onset  . Kidney disease Mother   . Hypertension Mother   . Kidney disease Brother    History  Substance Use Topics  . Smoking status: Never Smoker   . Smokeless tobacco: Never Used  . Alcohol Use: 0.0 oz/week   OB History    No data available     Review of Systems    Allergies  Aspirin  Home Medications   Prior to Admission medications   Medication Sig Start Date End Date Taking? Authorizing Provider  cloNIDine (CATAPRES) 0.1 MG tablet Take 1 tablet (0.1 mg total) by mouth 2 (two) times daily. 12/05/14   Thermon LeylandLaura A Davis, NP  DULoxetine (CYMBALTA) 20 MG capsule Take 1 capsule (20 mg total) by mouth daily. 12/05/14   Thermon LeylandLaura A Davis, NP  ibuprofen (ADVIL,MOTRIN) 200 MG tablet Take 600 mg by mouth every 6 (six) hours as needed (For pain.).    Historical Provider, MD  lisinopril (PRINIVIL,ZESTRIL) 5 MG tablet Take 1 tablet (5 mg total) by mouth daily. 12/05/14   Thermon LeylandLaura A Davis, NP  Multiple Vitamin (MULTIVITAMIN WITH MINERALS) TABS tablet Take 1 tablet by mouth daily. 12/05/14   Thermon LeylandLaura A Davis, NP  QUEtiapine (SEROQUEL) 25 MG tablet Take 1  tablet (25 mg total) by mouth at bedtime. 12/05/14   Thermon LeylandLaura A Davis, NP   BP 154/83 mmHg  Pulse 91  Temp(Src) 98.5 F (36.9 C) (Oral)  Resp 18  SpO2 99% Physical Exam  Constitutional: She is oriented to person, place, and time. She appears well-developed and well-nourished. No distress.  Obese female, clinically intoxicated  HENT:  Head: Normocephalic and atraumatic.  Nose: Nose normal.  Mouth/Throat: Oropharynx is clear and moist. No oropharyngeal exudate.  Eyes: Conjunctivae and EOM are normal. Pupils are equal, round, and reactive to light. No scleral icterus.  Neck: Normal range of motion. Neck supple. No JVD present. No tracheal deviation present. No thyromegaly present.  Cardiovascular: Normal rate, regular rhythm and normal heart sounds.  Exam reveals no gallop and no friction rub.   No murmur heard. Pulmonary/Chest: Effort normal and breath sounds normal. No respiratory distress. She has no wheezes. She exhibits no tenderness.  Abdominal: Soft. Bowel sounds are normal. She exhibits no distension and no mass. There is no tenderness. There is no rebound and no guarding.  Musculoskeletal: Normal range of motion. She exhibits no edema or tenderness.  Lymphadenopathy:    She has no cervical adenopathy.  Neurological: She is alert and oriented to person, place, and time. No cranial nerve deficit. She exhibits normal  muscle tone.  Skin: Skin is warm and dry. No rash noted. No erythema. No pallor.  Nursing note and vitals reviewed.   ED Course  Procedures (including critical care time) Labs Review Labs Reviewed  ETHANOL - Abnormal; Notable for the following:    Alcohol, Ethyl (B) 247 (*)    All other components within normal limits  BASIC METABOLIC PANEL - Abnormal; Notable for the following:    Glucose, Bld 116 (*)    Calcium 10.6 (*)    GFR calc non Af Amer 68 (*)    GFR calc Af Amer 79 (*)    All other components within normal limits  CBC - Abnormal; Notable for the  following:    Platelets 137 (*)    All other components within normal limits  URINE RAPID DRUG SCREEN (HOSP PERFORMED)    Imaging Review No results found.   EKG Interpretation None      MDM   Final diagnoses:  Suicidal ideation    Patient presents with SI and a plan.  She is high risk.  Will obtain TTS consult and anticipate admission. She is medically clear.    Tomasita Crumble, MD 12/30/14 [phone]

## 2015-01-05 ENCOUNTER — Telehealth (HOSPITAL_COMMUNITY): Payer: Self-pay

## 2015-01-05 NOTE — Telephone Encounter (Signed)
Patient called requesting a refill of her Clonidine, Cymbalta, Lisinopril and Seroquel as she was discharged taking from inpatient on 12/05/14.  Patient stated she had to cancel her new patient appointment on 12/25/14 due to not having any transportation that date but states she has rescheduled and now has an appointment set for 01/29/15.  Patient requested Dr. Michae KavaAgarwal refill all her currently prescribed medications 1 time until she can come in for first evaluation as states she has approximately 5 days left of each.

## 2015-01-06 ENCOUNTER — Other Ambulatory Visit (HOSPITAL_COMMUNITY): Payer: Self-pay | Admitting: Psychiatry

## 2015-01-06 DIAGNOSIS — F1094 Alcohol use, unspecified with alcohol-induced mood disorder: Secondary | ICD-10-CM

## 2015-01-06 DIAGNOSIS — F319 Bipolar disorder, unspecified: Secondary | ICD-10-CM

## 2015-01-06 MED ORDER — CLONIDINE HCL 0.1 MG PO TABS: 0.1000 mg | ORAL_TABLET | Freq: Two times a day (BID) | ORAL | Status: DC

## 2015-01-06 MED ORDER — QUETIAPINE FUMARATE 25 MG PO TABS: 25.0000 mg | ORAL_TABLET | Freq: Every day | ORAL | Status: DC

## 2015-01-06 MED ORDER — DULOXETINE HCL 20 MG PO CPEP: 20.0000 mg | ORAL_CAPSULE | Freq: Every day | ORAL | Status: DC

## 2015-01-06 NOTE — Telephone Encounter (Signed)
I advised patient that Dr. Michae KavaAgarwal sent the medication Seroquel, Cymbalta and Clonidine. I advised patient to please keep her appointment on 01-29-2015 for further refills.  I advised patient the medication Lisinopril she will need to get from her PCP.  Patient verbalized understanding

## 2015-01-06 NOTE — Telephone Encounter (Signed)
I have refilled for 20 days for Seroquel, Cymbalta and Clonidine. Pt will need to get Lisinopril from her PCP. Pt must be evaluated in the clinic to get any further refills.

## 2015-01-29 ENCOUNTER — Ambulatory Visit (HOSPITAL_COMMUNITY): Payer: Self-pay | Admitting: Psychiatry

## 2015-04-02 DIAGNOSIS — M545 Low back pain: Secondary | ICD-10-CM | POA: Diagnosis not present

## 2015-04-02 DIAGNOSIS — E784 Other hyperlipidemia: Secondary | ICD-10-CM | POA: Diagnosis not present

## 2015-04-02 DIAGNOSIS — M179 Osteoarthritis of knee, unspecified: Secondary | ICD-10-CM | POA: Diagnosis not present

## 2015-04-02 DIAGNOSIS — Z1389 Encounter for screening for other disorder: Secondary | ICD-10-CM | POA: Diagnosis not present

## 2015-04-02 DIAGNOSIS — I10 Essential (primary) hypertension: Secondary | ICD-10-CM | POA: Diagnosis not present

## 2015-04-02 DIAGNOSIS — Z131 Encounter for screening for diabetes mellitus: Secondary | ICD-10-CM | POA: Diagnosis not present

## 2015-04-08 DIAGNOSIS — M47817 Spondylosis without myelopathy or radiculopathy, lumbosacral region: Secondary | ICD-10-CM | POA: Diagnosis not present

## 2015-04-08 DIAGNOSIS — M179 Osteoarthritis of knee, unspecified: Secondary | ICD-10-CM | POA: Diagnosis not present

## 2015-04-08 DIAGNOSIS — M5136 Other intervertebral disc degeneration, lumbar region: Secondary | ICD-10-CM | POA: Diagnosis not present

## 2015-04-08 DIAGNOSIS — M47816 Spondylosis without myelopathy or radiculopathy, lumbar region: Secondary | ICD-10-CM | POA: Diagnosis not present

## 2015-04-30 DIAGNOSIS — I10 Essential (primary) hypertension: Secondary | ICD-10-CM | POA: Diagnosis not present

## 2015-04-30 DIAGNOSIS — E784 Other hyperlipidemia: Secondary | ICD-10-CM | POA: Diagnosis not present

## 2015-04-30 DIAGNOSIS — M5414 Radiculopathy, thoracic region: Secondary | ICD-10-CM | POA: Diagnosis not present

## 2015-04-30 DIAGNOSIS — M179 Osteoarthritis of knee, unspecified: Secondary | ICD-10-CM | POA: Diagnosis not present

## 2015-04-30 DIAGNOSIS — F319 Bipolar disorder, unspecified: Secondary | ICD-10-CM | POA: Diagnosis not present

## 2015-05-04 ENCOUNTER — Other Ambulatory Visit: Payer: Self-pay | Admitting: Internal Medicine

## 2015-05-04 DIAGNOSIS — E2839 Other primary ovarian failure: Secondary | ICD-10-CM

## 2015-06-25 DIAGNOSIS — M5136 Other intervertebral disc degeneration, lumbar region: Secondary | ICD-10-CM | POA: Diagnosis not present

## 2015-06-25 DIAGNOSIS — E784 Other hyperlipidemia: Secondary | ICD-10-CM | POA: Diagnosis not present

## 2015-06-25 DIAGNOSIS — I10 Essential (primary) hypertension: Secondary | ICD-10-CM | POA: Diagnosis not present

## 2015-08-06 DIAGNOSIS — Z23 Encounter for immunization: Secondary | ICD-10-CM | POA: Diagnosis not present

## 2015-08-06 DIAGNOSIS — I10 Essential (primary) hypertension: Secondary | ICD-10-CM | POA: Diagnosis not present

## 2015-08-06 DIAGNOSIS — K219 Gastro-esophageal reflux disease without esophagitis: Secondary | ICD-10-CM | POA: Diagnosis not present

## 2015-08-06 DIAGNOSIS — M5136 Other intervertebral disc degeneration, lumbar region: Secondary | ICD-10-CM | POA: Diagnosis not present

## 2015-10-01 DIAGNOSIS — K219 Gastro-esophageal reflux disease without esophagitis: Secondary | ICD-10-CM | POA: Diagnosis not present

## 2015-10-01 DIAGNOSIS — M5136 Other intervertebral disc degeneration, lumbar region: Secondary | ICD-10-CM | POA: Diagnosis not present

## 2015-10-01 DIAGNOSIS — I10 Essential (primary) hypertension: Secondary | ICD-10-CM | POA: Diagnosis not present

## 2015-10-01 DIAGNOSIS — Z719 Counseling, unspecified: Secondary | ICD-10-CM | POA: Diagnosis not present

## 2015-10-01 DIAGNOSIS — F319 Bipolar disorder, unspecified: Secondary | ICD-10-CM | POA: Diagnosis not present

## 2018-10-04 ENCOUNTER — Other Ambulatory Visit: Payer: Self-pay | Admitting: Internal Medicine

## 2018-10-04 DIAGNOSIS — R5381 Other malaise: Secondary | ICD-10-CM

## 2018-10-04 DIAGNOSIS — E2839 Other primary ovarian failure: Secondary | ICD-10-CM

## 2021-10-27 ENCOUNTER — Encounter: Payer: Self-pay | Admitting: *Deleted

## 2022-02-23 ENCOUNTER — Emergency Department (HOSPITAL_COMMUNITY): Payer: Medicare Other

## 2022-02-23 ENCOUNTER — Observation Stay (HOSPITAL_COMMUNITY): Payer: Medicare Other

## 2022-02-23 ENCOUNTER — Inpatient Hospital Stay (HOSPITAL_COMMUNITY)
Admission: EM | Admit: 2022-02-23 | Discharge: 2022-03-04 | DRG: 472 | Disposition: A | Discharge status: Skilled Nursing Facility | Payer: Medicare Other | Attending: Internal Medicine | Admitting: Internal Medicine

## 2022-02-23 DIAGNOSIS — Z886 Allergy status to analgesic agent status: Secondary | ICD-10-CM

## 2022-02-23 DIAGNOSIS — M5001 Cervical disc disorder with myelopathy,  high cervical region: Principal | ICD-10-CM | POA: Diagnosis present

## 2022-02-23 DIAGNOSIS — M25561 Pain in right knee: Secondary | ICD-10-CM | POA: Diagnosis present

## 2022-02-23 DIAGNOSIS — N390 Urinary tract infection, site not specified: Secondary | ICD-10-CM | POA: Diagnosis present

## 2022-02-23 DIAGNOSIS — N898 Other specified noninflammatory disorders of vagina: Secondary | ICD-10-CM | POA: Diagnosis not present

## 2022-02-23 DIAGNOSIS — E785 Hyperlipidemia, unspecified: Secondary | ICD-10-CM | POA: Diagnosis present

## 2022-02-23 DIAGNOSIS — E66813 Obesity, class 3: Secondary | ICD-10-CM | POA: Diagnosis present

## 2022-02-23 DIAGNOSIS — I472 Ventricular tachycardia, unspecified: Secondary | ICD-10-CM | POA: Diagnosis not present

## 2022-02-23 DIAGNOSIS — Z8249 Family history of ischemic heart disease and other diseases of the circulatory system: Secondary | ICD-10-CM

## 2022-02-23 DIAGNOSIS — W19XXXA Unspecified fall, initial encounter: Secondary | ICD-10-CM

## 2022-02-23 DIAGNOSIS — R200 Anesthesia of skin: Secondary | ICD-10-CM | POA: Diagnosis present

## 2022-02-23 DIAGNOSIS — E662 Morbid (severe) obesity with alveolar hypoventilation: Secondary | ICD-10-CM | POA: Diagnosis present

## 2022-02-23 DIAGNOSIS — Z7982 Long term (current) use of aspirin: Secondary | ICD-10-CM

## 2022-02-23 DIAGNOSIS — E538 Deficiency of other specified B group vitamins: Secondary | ICD-10-CM | POA: Diagnosis present

## 2022-02-23 DIAGNOSIS — D696 Thrombocytopenia, unspecified: Secondary | ICD-10-CM | POA: Diagnosis not present

## 2022-02-23 DIAGNOSIS — K5900 Constipation, unspecified: Secondary | ICD-10-CM | POA: Diagnosis not present

## 2022-02-23 DIAGNOSIS — I10 Essential (primary) hypertension: Secondary | ICD-10-CM | POA: Diagnosis present

## 2022-02-23 DIAGNOSIS — M4802 Spinal stenosis, cervical region: Secondary | ICD-10-CM | POA: Diagnosis present

## 2022-02-23 DIAGNOSIS — R531 Weakness: Secondary | ICD-10-CM | POA: Diagnosis not present

## 2022-02-23 DIAGNOSIS — M170 Bilateral primary osteoarthritis of knee: Secondary | ICD-10-CM | POA: Diagnosis present

## 2022-02-23 DIAGNOSIS — R296 Repeated falls: Secondary | ICD-10-CM

## 2022-02-23 DIAGNOSIS — E876 Hypokalemia: Secondary | ICD-10-CM | POA: Diagnosis present

## 2022-02-23 DIAGNOSIS — F319 Bipolar disorder, unspecified: Secondary | ICD-10-CM | POA: Diagnosis present

## 2022-02-23 DIAGNOSIS — M25562 Pain in left knee: Secondary | ICD-10-CM | POA: Diagnosis present

## 2022-02-23 DIAGNOSIS — Z6841 Body Mass Index (BMI) 40.0 and over, adult: Secondary | ICD-10-CM

## 2022-02-23 DIAGNOSIS — I4729 Other ventricular tachycardia: Secondary | ICD-10-CM | POA: Diagnosis not present

## 2022-02-23 DIAGNOSIS — Z841 Family history of disorders of kidney and ureter: Secondary | ICD-10-CM

## 2022-02-23 DIAGNOSIS — Z79899 Other long term (current) drug therapy: Secondary | ICD-10-CM

## 2022-02-23 DIAGNOSIS — R32 Unspecified urinary incontinence: Secondary | ICD-10-CM | POA: Diagnosis present

## 2022-02-23 DIAGNOSIS — Z90710 Acquired absence of both cervix and uterus: Secondary | ICD-10-CM

## 2022-02-23 DIAGNOSIS — W1830XA Fall on same level, unspecified, initial encounter: Secondary | ICD-10-CM | POA: Diagnosis present

## 2022-02-23 DIAGNOSIS — M4712 Other spondylosis with myelopathy, cervical region: Secondary | ICD-10-CM | POA: Diagnosis present

## 2022-02-23 DIAGNOSIS — M50021 Cervical disc disorder at C4-C5 level with myelopathy: Secondary | ICD-10-CM | POA: Diagnosis present

## 2022-02-23 DIAGNOSIS — Y92002 Bathroom of unspecified non-institutional (private) residence single-family (private) house as the place of occurrence of the external cause: Secondary | ICD-10-CM

## 2022-02-23 DIAGNOSIS — N179 Acute kidney failure, unspecified: Secondary | ICD-10-CM | POA: Diagnosis present

## 2022-02-23 LAB — CBC WITH DIFFERENTIAL/PLATELET
Abs Immature Granulocytes: 0.01 10*3/uL (ref 0.00–0.07)
Basophils Absolute: 0 10*3/uL (ref 0.0–0.1)
Basophils Relative: 1 %
Eosinophils Absolute: 0.2 10*3/uL (ref 0.0–0.5)
Eosinophils Relative: 3 %
HCT: 35.8 % — ABNORMAL LOW (ref 36.0–46.0)
Hemoglobin: 11.7 g/dL — ABNORMAL LOW (ref 12.0–15.0)
Immature Granulocytes: 0 %
Lymphocytes Relative: 34 %
Lymphs Abs: 2.2 10*3/uL (ref 0.7–4.0)
MCH: 27 pg (ref 26.0–34.0)
MCHC: 32.7 g/dL (ref 30.0–36.0)
MCV: 82.7 fL (ref 80.0–100.0)
Monocytes Absolute: 0.4 10*3/uL (ref 0.1–1.0)
Monocytes Relative: 6 %
Neutro Abs: 3.5 10*3/uL (ref 1.7–7.7)
Neutrophils Relative %: 56 %
Platelets: 177 10*3/uL (ref 150–400)
RBC: 4.33 MIL/uL (ref 3.87–5.11)
RDW: 16 % — ABNORMAL HIGH (ref 11.5–15.5)
WBC: 6.3 10*3/uL (ref 4.0–10.5)
nRBC: 0 % (ref 0.0–0.2)

## 2022-02-23 LAB — RAPID URINE DRUG SCREEN, HOSP PERFORMED
Amphetamines: NOT DETECTED
Barbiturates: NOT DETECTED
Benzodiazepines: NOT DETECTED
Cocaine: NOT DETECTED
Opiates: NOT DETECTED
Tetrahydrocannabinol: NOT DETECTED

## 2022-02-23 LAB — BASIC METABOLIC PANEL
Anion gap: 8 (ref 5–15)
BUN: 18 mg/dL (ref 8–23)
CO2: 25 mmol/L (ref 22–32)
Calcium: 10.6 mg/dL — ABNORMAL HIGH (ref 8.9–10.3)
Chloride: 109 mmol/L (ref 98–111)
Creatinine, Ser: 1.34 mg/dL — ABNORMAL HIGH (ref 0.44–1.00)
GFR, Estimated: 42 mL/min — ABNORMAL LOW (ref >60)
Glucose, Bld: 126 mg/dL — ABNORMAL HIGH (ref 70–99)
Potassium: 3.4 mmol/L — ABNORMAL LOW (ref 3.5–5.1)
Sodium: 142 mmol/L (ref 135–145)

## 2022-02-23 LAB — ETHANOL: Alcohol, Ethyl (B): <10 mg/dL (ref <10)

## 2022-02-23 LAB — URINALYSIS, ROUTINE W REFLEX MICROSCOPIC
Bilirubin Urine: NEGATIVE
Glucose, UA: NEGATIVE mg/dL
Hgb urine dipstick: NEGATIVE
Ketones, ur: NEGATIVE mg/dL
Nitrite: POSITIVE — AB
Protein, ur: NEGATIVE mg/dL
Specific Gravity, Urine: 1.016 (ref 1.005–1.030)
pH: 5 (ref 5.0–8.0)

## 2022-02-23 LAB — PROTIME-INR
INR: 1 (ref 0.8–1.2)
Prothrombin Time: 13.3 seconds (ref 11.4–15.2)

## 2022-02-23 LAB — APTT: aPTT: 30 seconds (ref 24–36)

## 2022-02-23 IMAGING — MR MR HEAD W/O CM
6 of 10 series · 29 of 48 positions shown · non-contrast
Comparison: None Available.

CLINICAL DATA: Weakness and falls

EXAM:
MRI HEAD WITHOUT CONTRAST
TECHNIQUE: Multiplanar, multiecho pulse sequences of the brain and surrounding
structures were obtained without intravenous contrast.

[Series 2: DWI · axial · 3.0mm · 0.94mm/px · z∈[-140,+7]mm · 9 of 100 slices shown (1 of 2)]
[im 1/100]
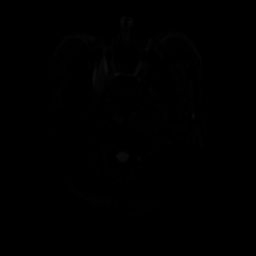
[im 13/100]
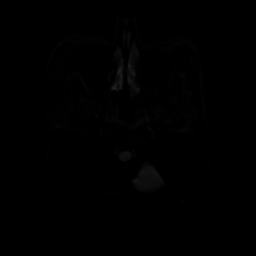
[im 25/100]
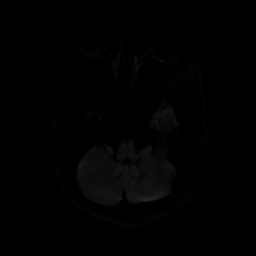
[im 38/100]
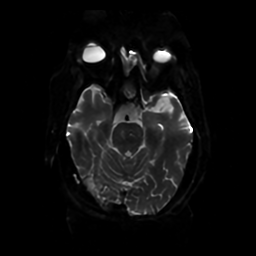
[im 50/100]
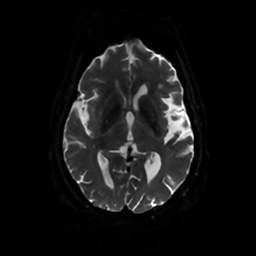
[im 62/100]
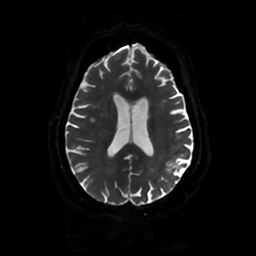
[im 75/100]
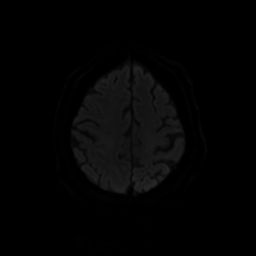
[im 87/100]
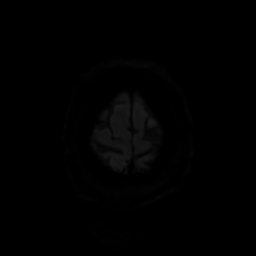
[im 100/100]
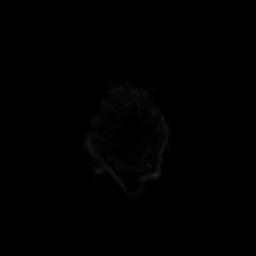

[Series 3: DWI · coronal · 4.0mm · 0.94mm/px · 7 of 74 slices shown (2 of 2)]
[im 1/74]
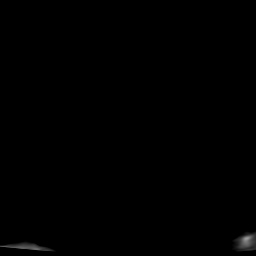
[im 13/74]
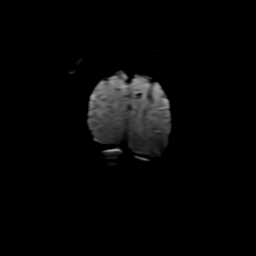
[im 25/74]
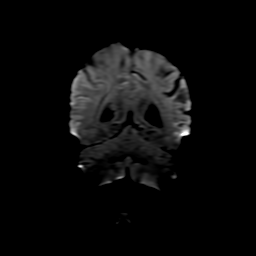
[im 37/74]
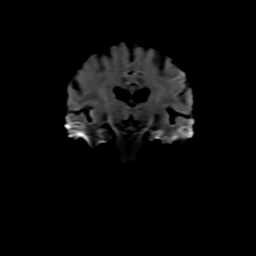
[im 49/74]
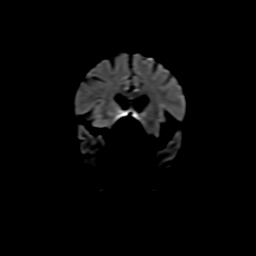
[im 61/74]
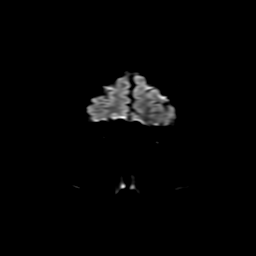
[im 74/74]
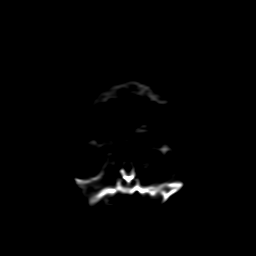

[Series 4: FLAIR · sagittal · 5.0mm · 0.23mm/px · 2 of 24 slices shown (1 of 2)]
[im 1/24]
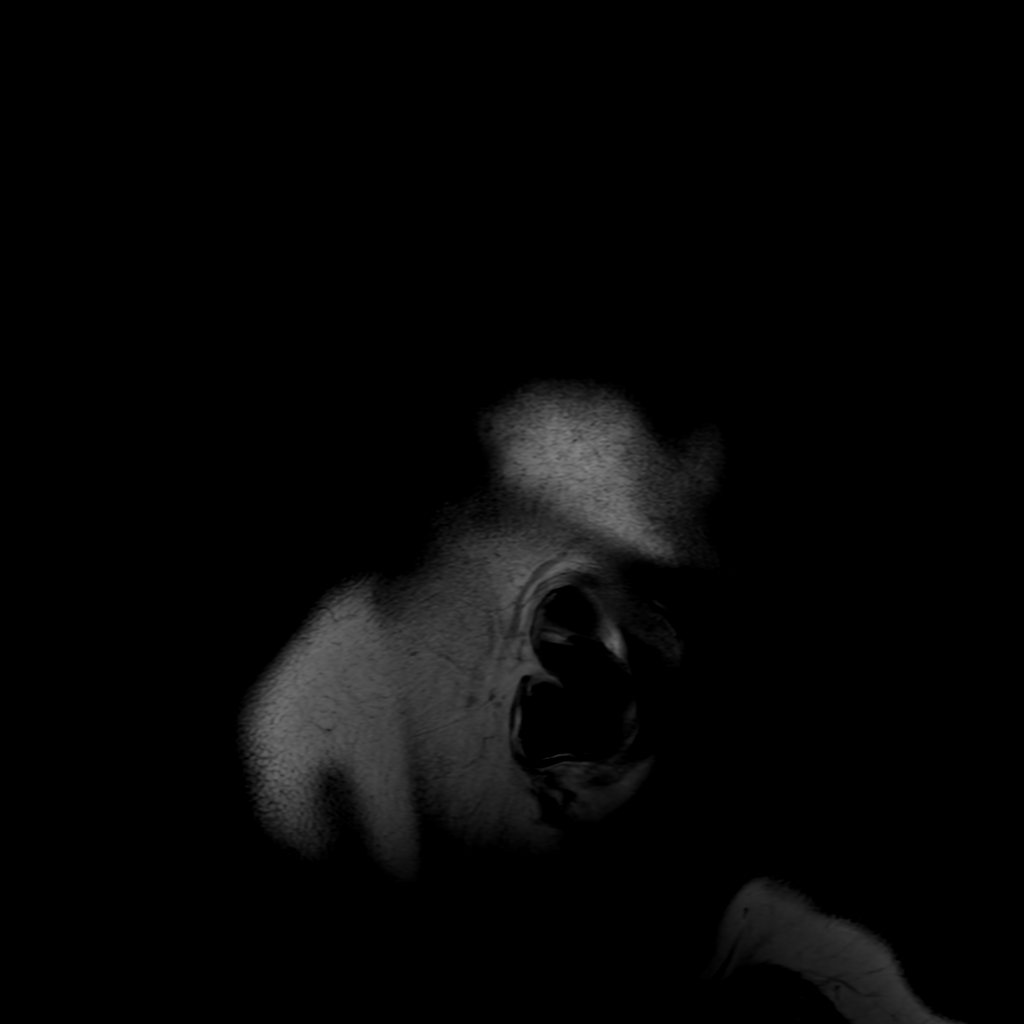
[im 24/24]
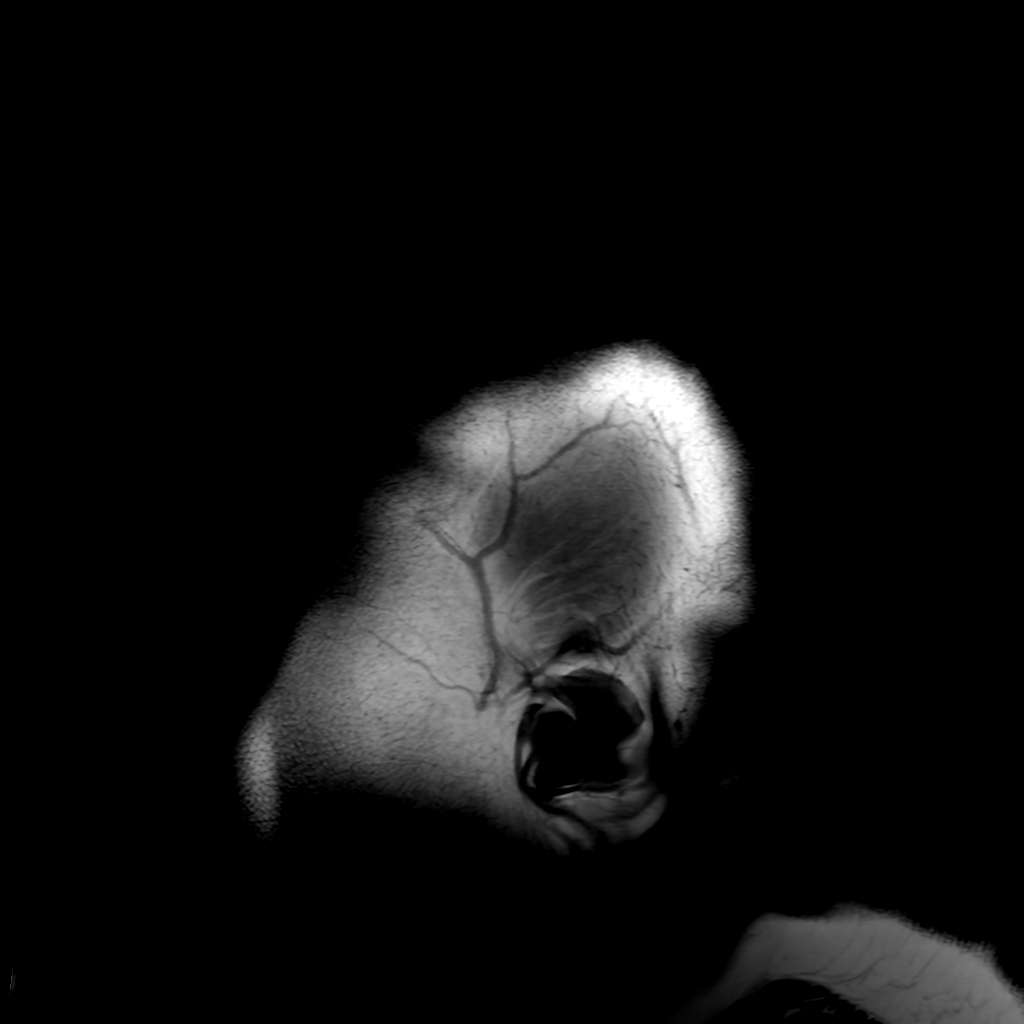

[Series 6: FLAIR · axial · 4.0mm · 0.45mm/px · z∈[-132,+17]mm · 3 of 35 slices shown (2 of 2)]
[im 1/35]
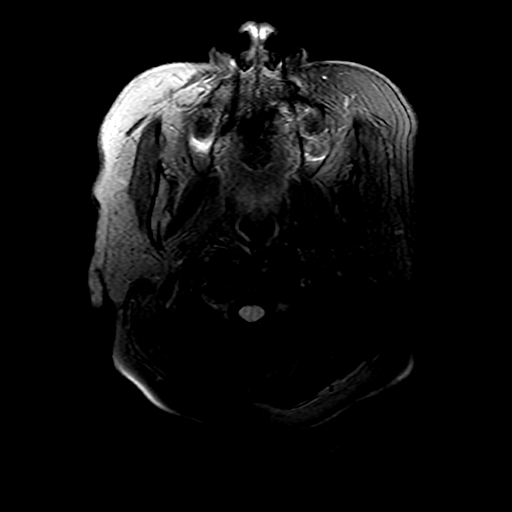
[im 18/35]
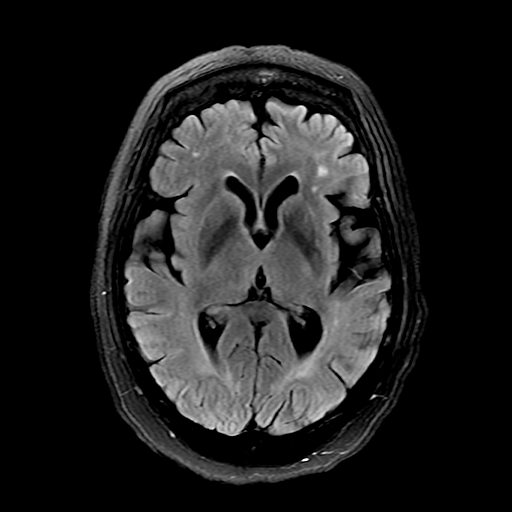
[im 35/35]
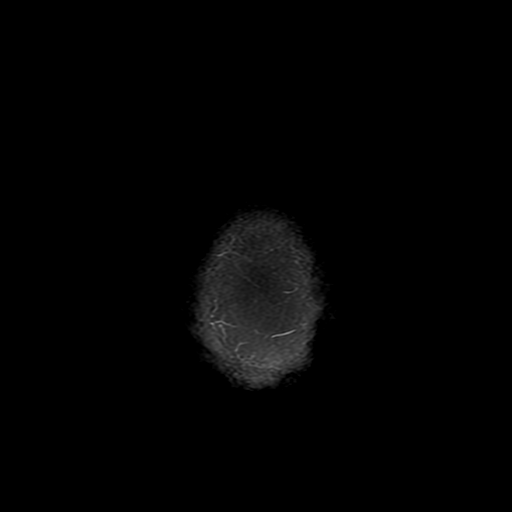

[Series 250: ADC · axial · 3.0mm · 0.94mm/px · z∈[-140,+7]mm · 5 of 50 slices shown (1 of 2)]
[im 1/50]
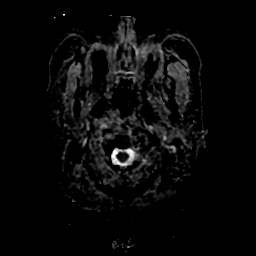
[im 13/50]
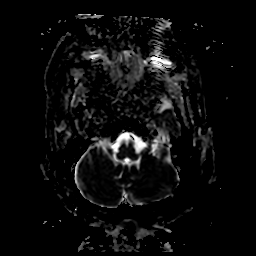
[im 25/50]
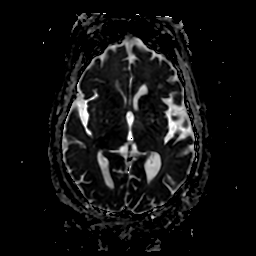
[im 37/50]
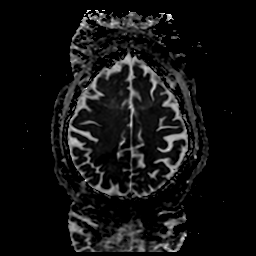
[im 50/50]
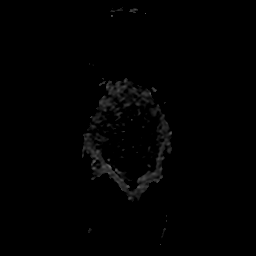

[Series 350: ADC · coronal · 4.0mm · 0.94mm/px · 3 of 36 slices shown (2 of 2)]
[im 1/36]
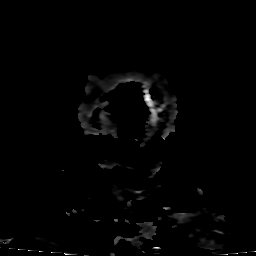
[im 18/36]
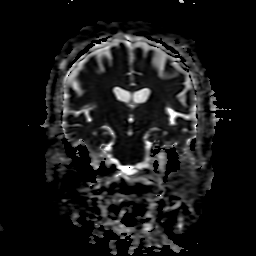
[im 36/36]
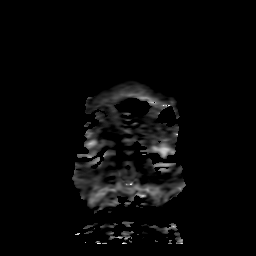

[29 of 48 positions shown; findings below may reference images not displayed]

FINDINGS: Brain: No acute infarct, mass effect or extra-axial collection.
Chronic blood products over the posterior left hemisphere. There is
multifocal hyperintense T2-weighted signal within the white matter.
Generalized cerebral volume loss. Old left parietal infarct. The
midline structures are normal.

Vascular: Major flow voids are preserved.

Skull and upper cervical spine: Normal calvarium and skull base.
Visualized upper cervical spine and soft tissues are normal.

Sinuses/Orbits:No paranasal sinus fluid levels or advanced mucosal
thickening. No mastoid or middle ear effusion. Normal orbits.
IMPRESSION: 1. No acute intracranial abnormality.
2. Old left parietal infarct and findings of chronic small vessel
disease.

## 2022-02-23 IMAGING — MR MR LUMBAR SPINE W/O CM
4 of 5 series · 19 of 48 positions shown · non-contrast
Comparison: None Available.

CLINICAL DATA: Lumbar radiculopathy

EXAM:
MRI LUMBAR SPINE WITHOUT CONTRAST
TECHNIQUE: Multiplanar, multisequence MR imaging of the lumbar spine was
performed. No intravenous contrast was administered.

[Series 17: T2 · sagittal · 4.0mm · 0.55mm/px · 6 of 17 slices shown (1 of 2)]
[im 1/17]
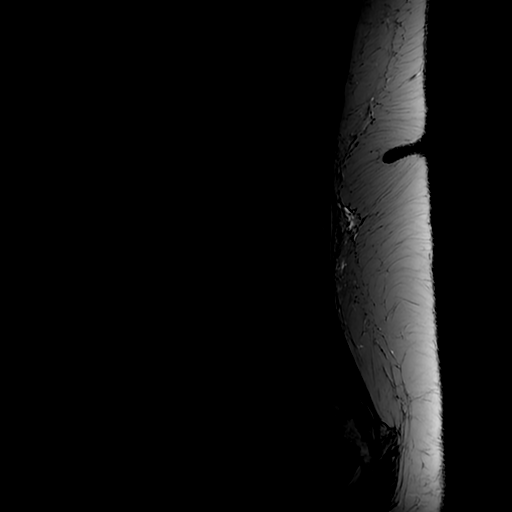
[im 4/17]
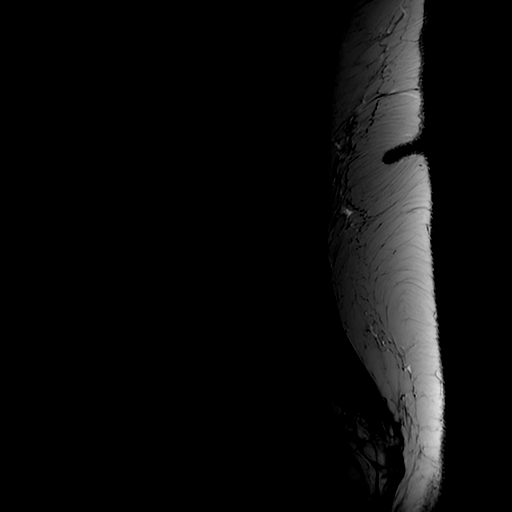
[im 7/17]
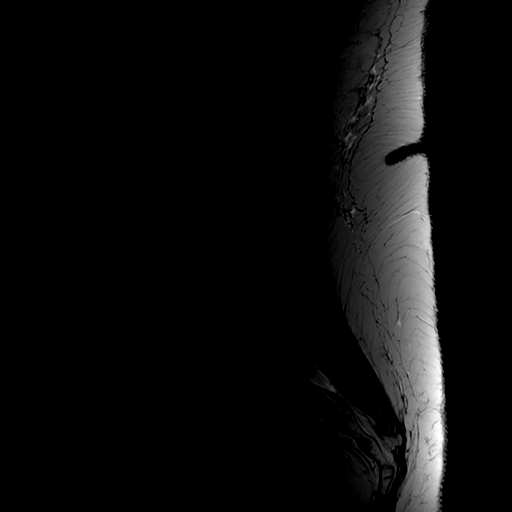
[im 10/17]
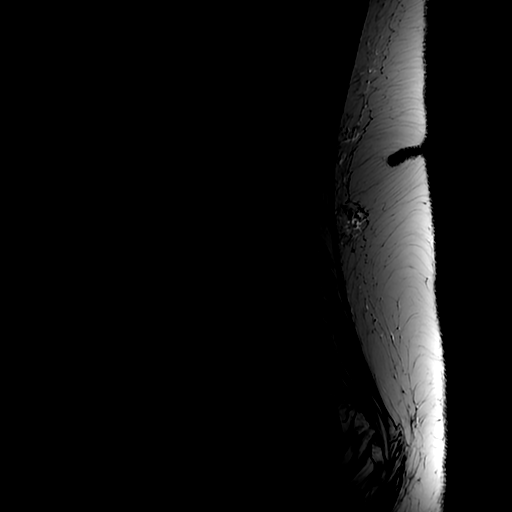
[im 13/17]
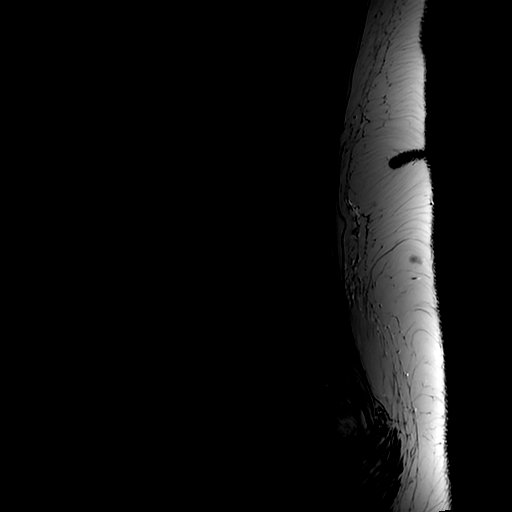
[im 17/17]
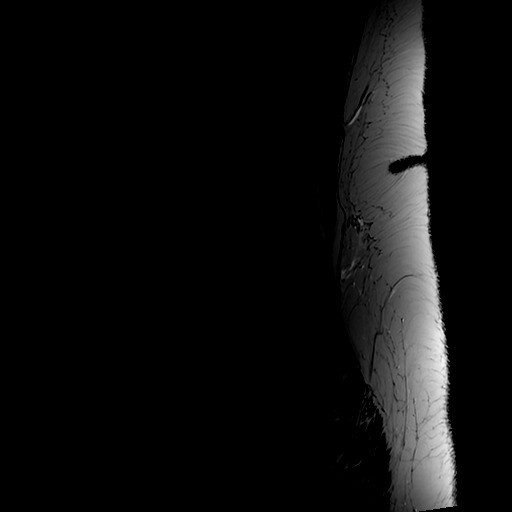

[Series 19: T1 · sagittal · 4.0mm · 0.55mm/px · 3 of 17 slices shown (1 of 2)]
[im 4/17]
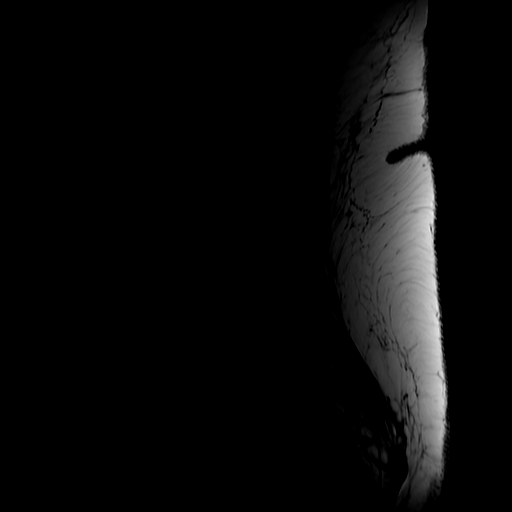
[im 10/17]
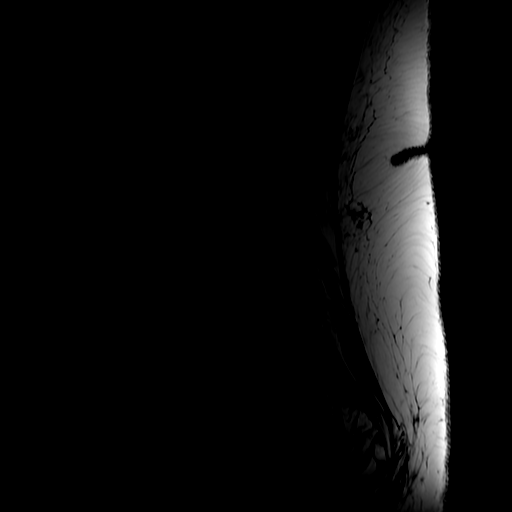
[im 17/17]
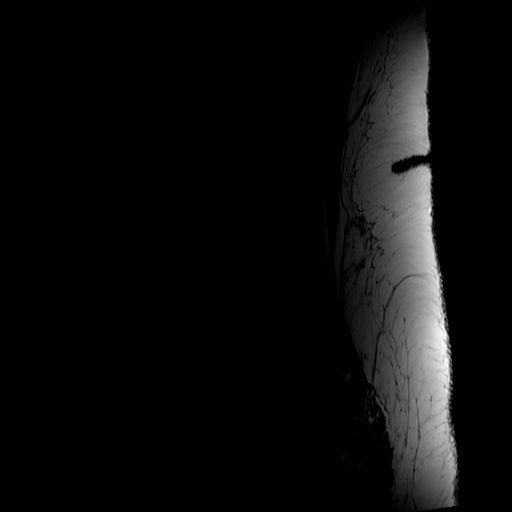

[Series 20: T2 · axial · 4.0mm · 0.39mm/px · z∈[-659,-479]mm · 7 of 43 slices shown (2 of 2)]
[im 1/43]
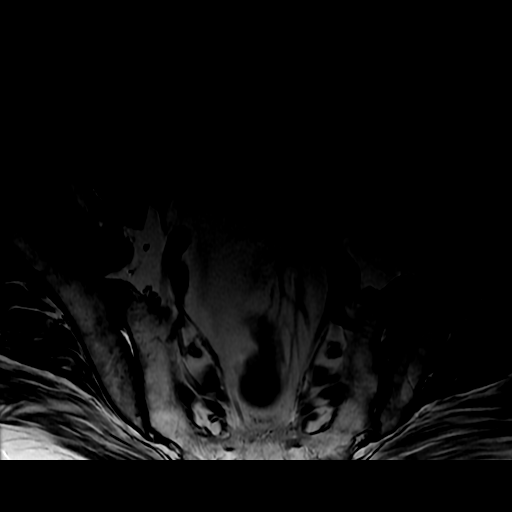
[im 7/43]
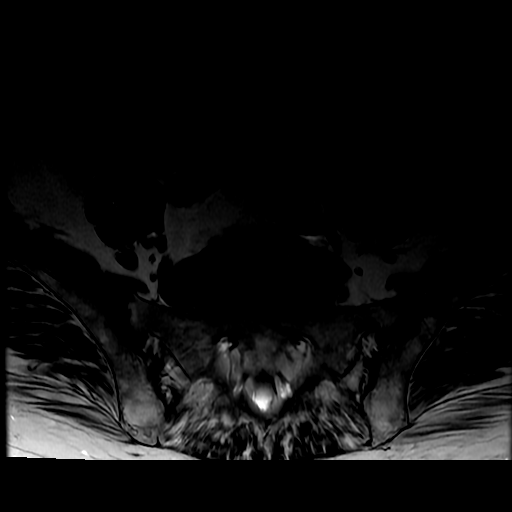
[im 13/43]
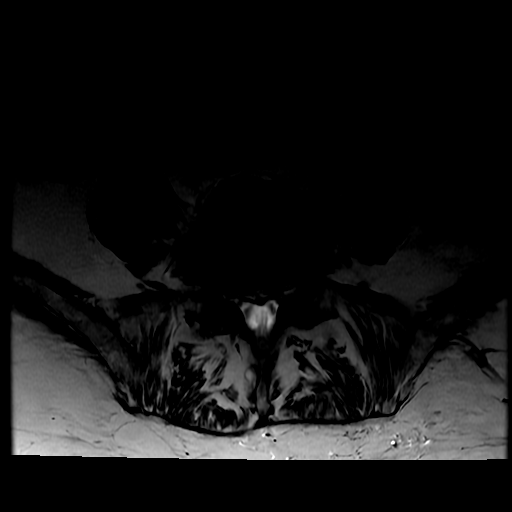
[im 19/43]
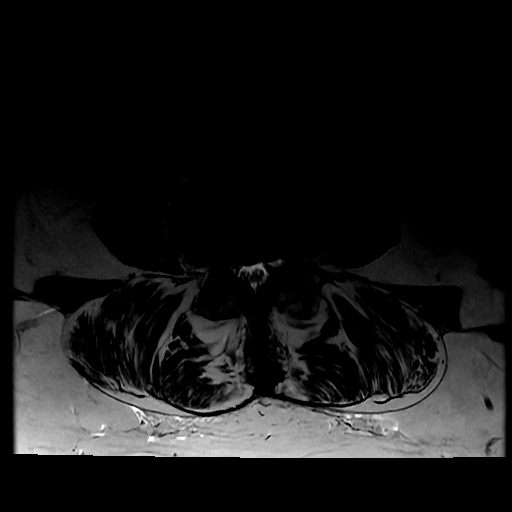
[im 22/43]
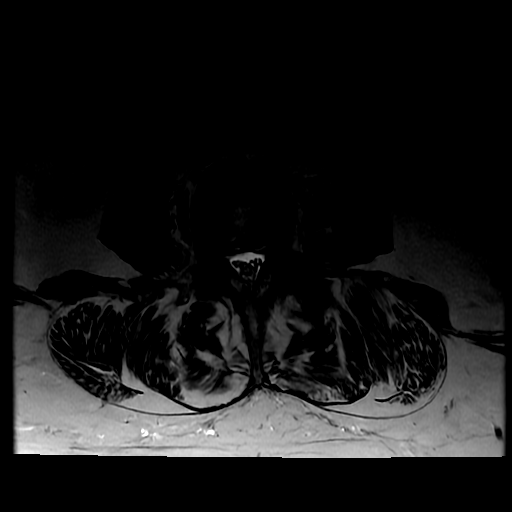
[im 25/43]
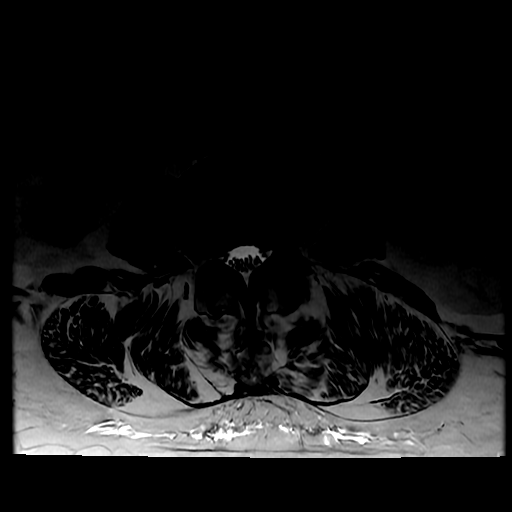
[im 37/43]
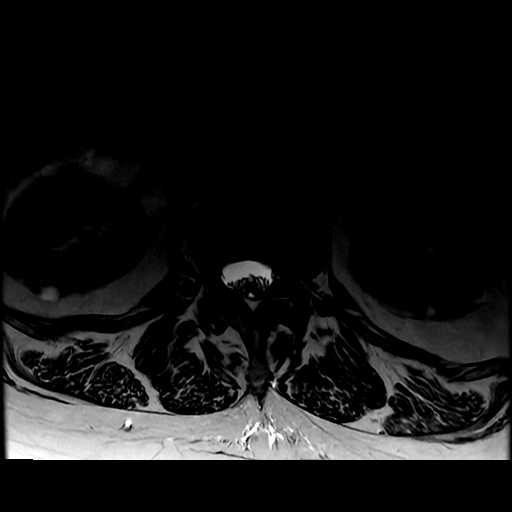

[Series 21: T1 · axial · 4.0mm · 0.39mm/px · z∈[-629,-479]mm · 3 of 43 slices shown (2 of 2)]
[im 7/43]
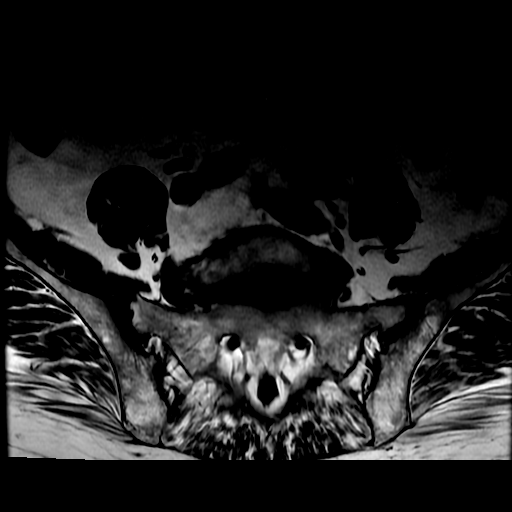
[im 22/43]
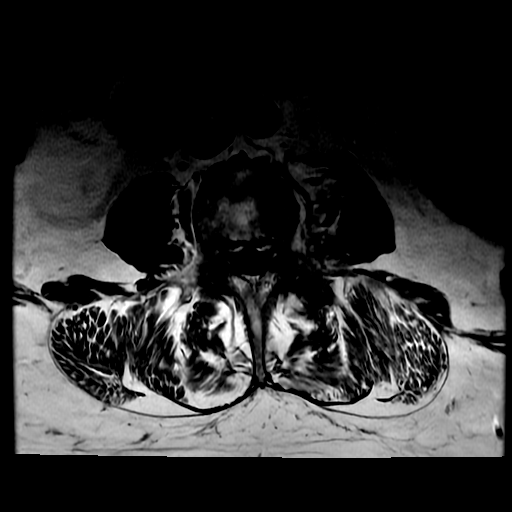
[im 37/43]
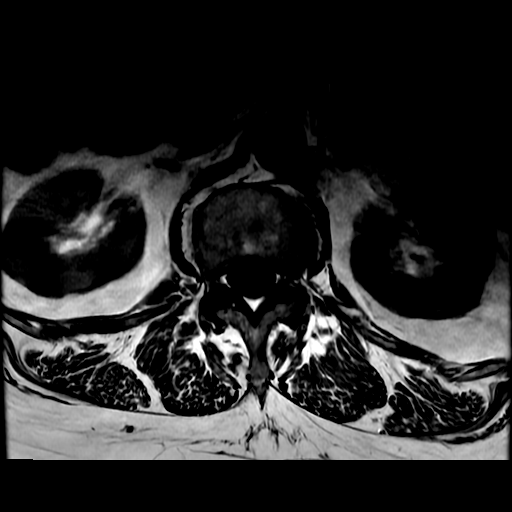

[19 of 48 positions shown; findings below may reference images not displayed]

FINDINGS: Segmentation:  Standard.

Alignment:  Physiologic.

Vertebrae:  No fracture, evidence of discitis, or bone lesion.

Conus medullaris and cauda equina: Conus extends to the L1 level.
Conus and cauda equina appear normal.

Paraspinal and other soft tissues: Negative.

Disc levels:

L1-L2: Normal disc space and facet joints. No spinal canal stenosis.
No neural foraminal stenosis.

L2-L3: Left asymmetric disc bulge. Narrowing of both lateral
recesses without central spinal canal stenosis. Mild left neural
foraminal stenosis.

L3-L4: Right asymmetric disc bulge. Mild spinal canal stenosis.
Right lateral recess narrowing. No neural foraminal stenosis.

L4-L5: Moderate facet hypertrophy with disc bulge and endplate
spurring. Severe spinal canal stenosis. Mild right neural foraminal
stenosis.

L5-S1: Small central disc protrusion. No spinal canal stenosis. No
neural foraminal stenosis.

Visualized sacrum: Normal.
IMPRESSION: 1. Severe spinal canal stenosis at L4-L5 due to combination of disc
bulge and facet arthrosis.
2. Mild spinal canal stenosis at L3-L4.
3. Mild left L2-3 and right L4-5 neural foraminal stenosis.

## 2022-02-23 IMAGING — DX DG CHEST 1V
1 series · 1 of 1 positions shown · non-contrast
Comparison: [DATE]

CLINICAL DATA: Shortness of breath, numbness left side of body

EXAM:
CHEST  1 VIEW

[chest ap]
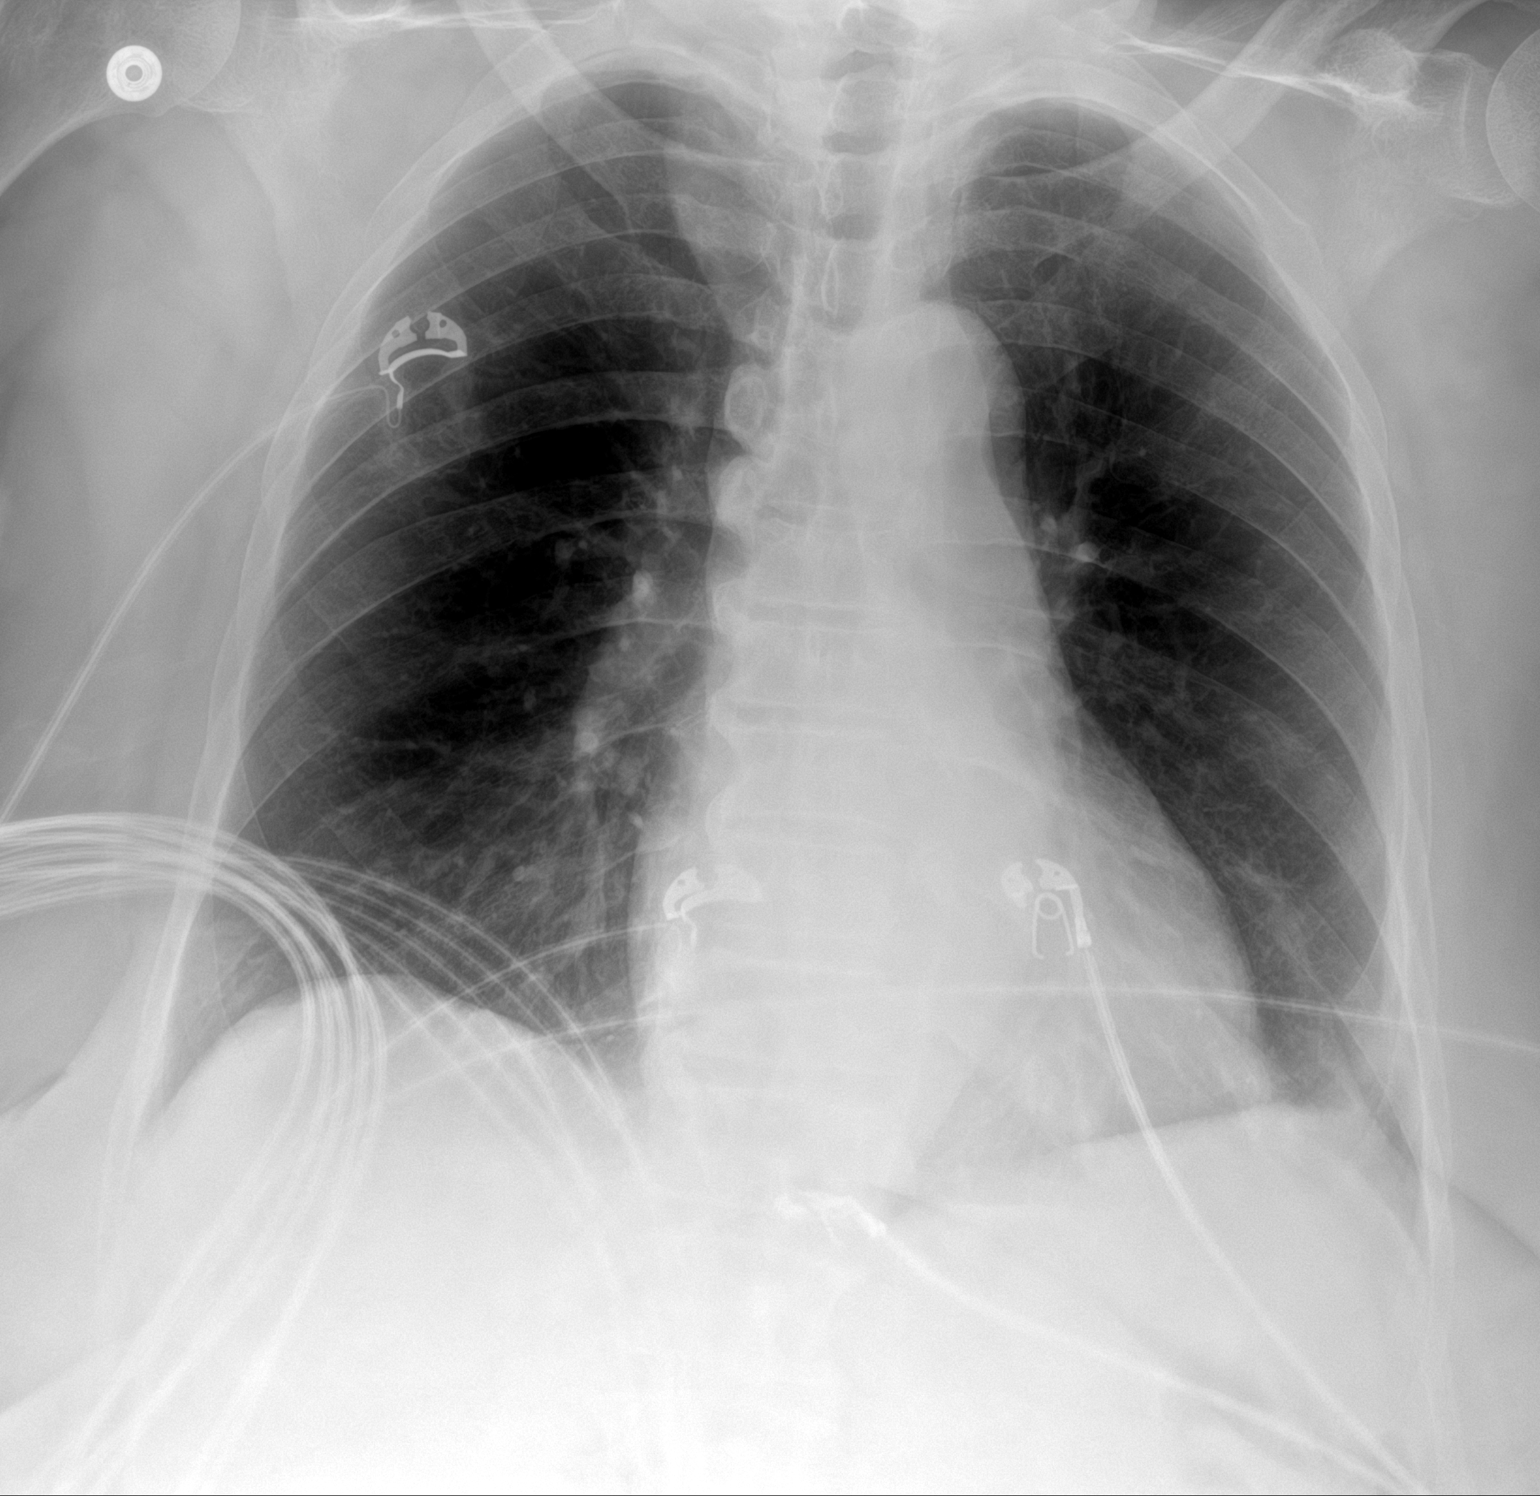

[1 of 1 positions shown; findings below may reference images not displayed]

FINDINGS: Transverse diameter of heart is slightly increased. There are no
signs of pulmonary edema or focal pulmonary consolidation. There is
no pleural effusion or pneumothorax.
IMPRESSION: No active disease.

## 2022-02-23 IMAGING — MR MR CERVICAL SPINE W/O CM
4 of 5 series · 19 of 48 positions shown · non-contrast
Comparison: None Available.

CLINICAL DATA: Weakness and falls

EXAM:
MRI CERVICAL SPINE WITHOUT CONTRAST
TECHNIQUE: Multiplanar, multisequence MR imaging of the cervical spine was
performed. No intravenous contrast was administered.

[Series 11: T2 · sagittal · 3.0mm · 0.43mm/px · 4 of 16 slices shown (1 of 2)]
[im 1/16]
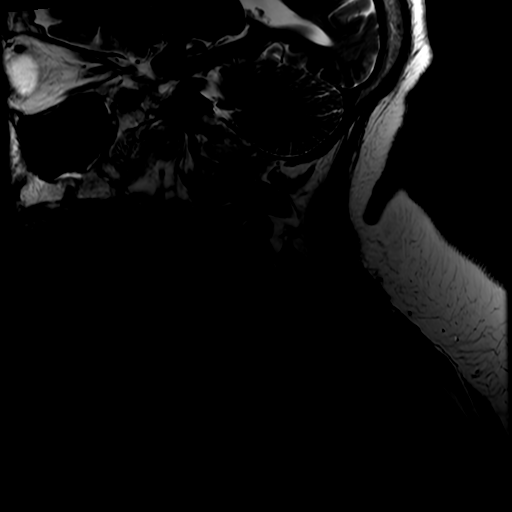
[im 6/16]
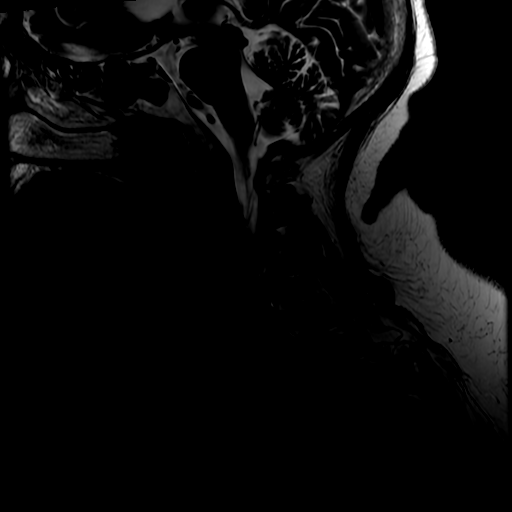
[im 11/16]
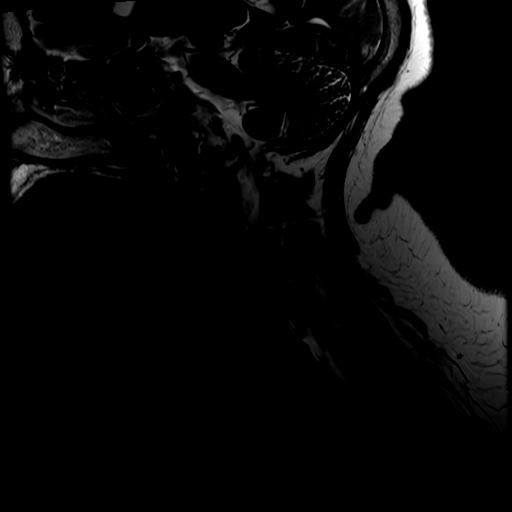
[im 16/16]
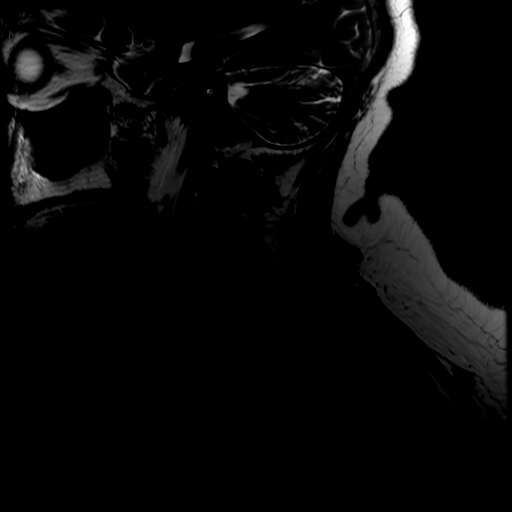

[Series 12: FLAIR · sagittal · 3.0mm · 0.43mm/px · 3 of 16 slices shown]
[im 1/16]
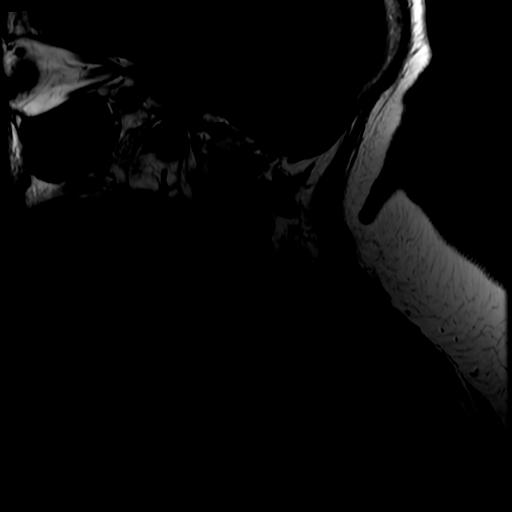
[im 11/16]
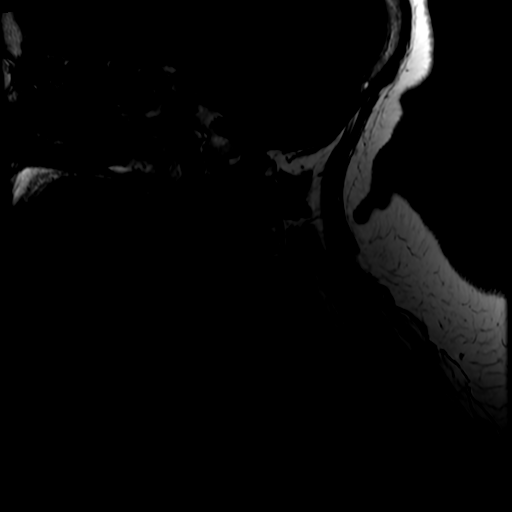
[im 16/16]
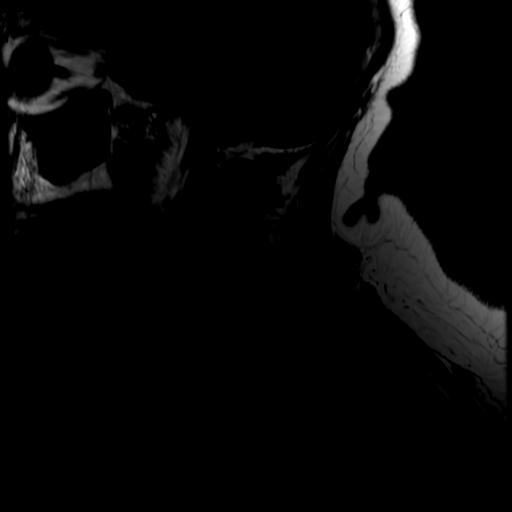

[Series 13: STIR · sagittal · 3.0mm · 0.43mm/px · 4 of 16 slices shown]
[im 1/16]
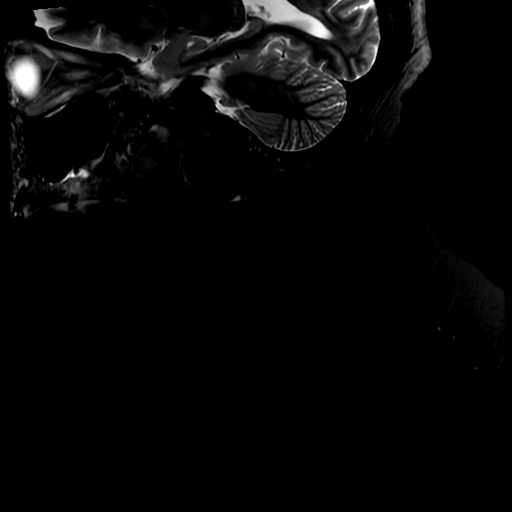
[im 6/16]
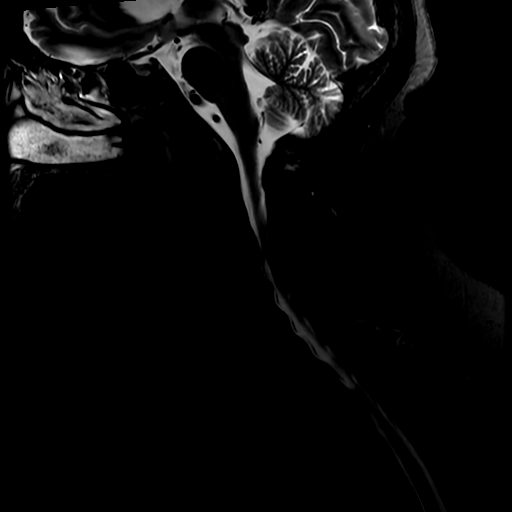
[im 11/16]
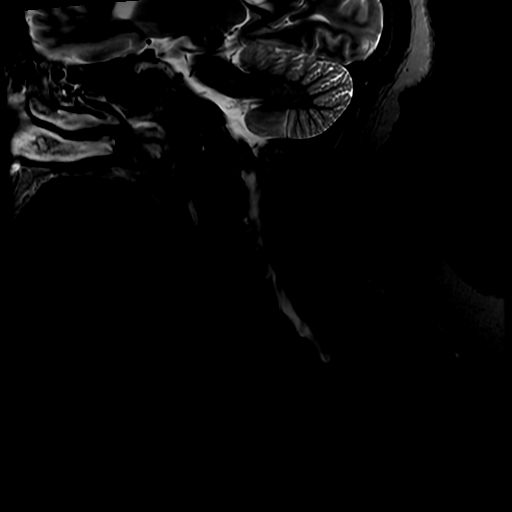
[im 16/16]
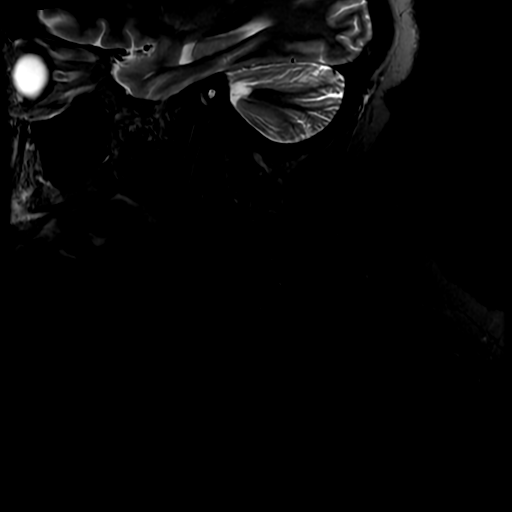

[Series 15: T2 · axial · 3.0mm · 0.35mm/px · z∈[-273,-154]mm · 8 of 38 slices shown (2 of 2)]
[im 1/38]
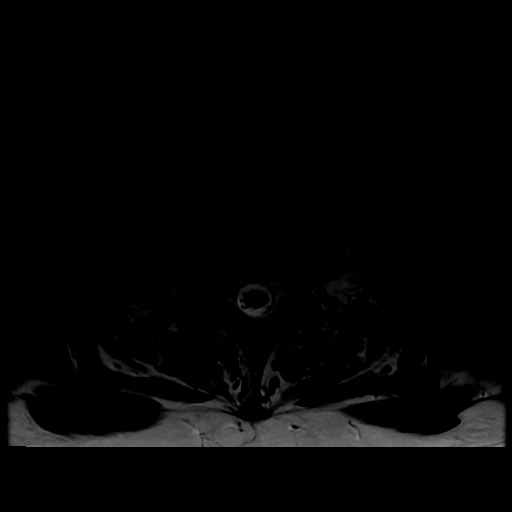
[im 5/38]
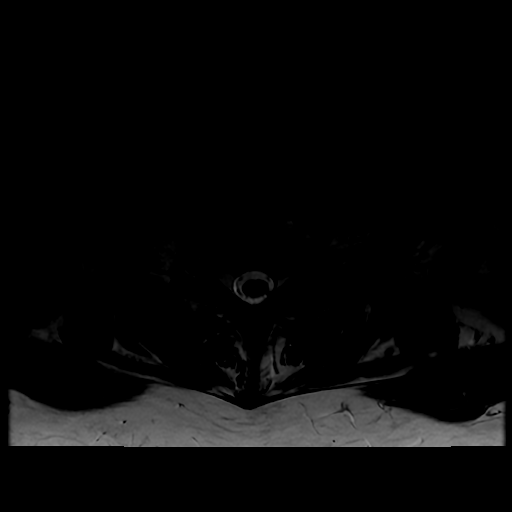
[im 13/38]
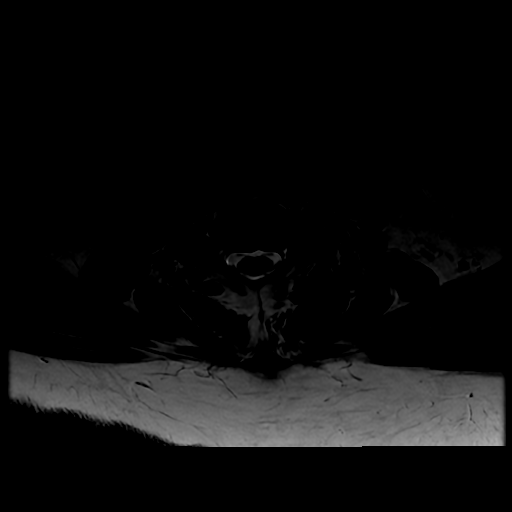
[im 17/38]
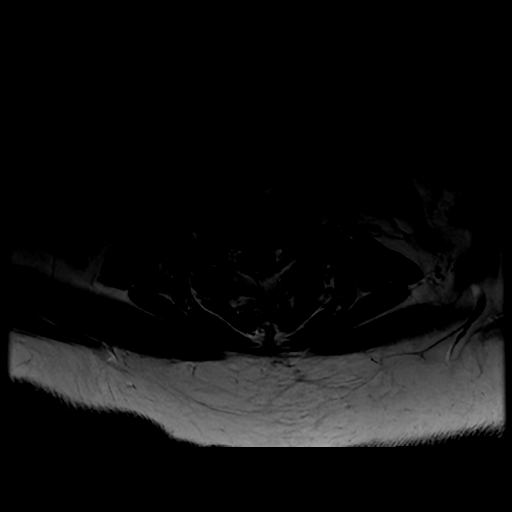
[im 21/38]
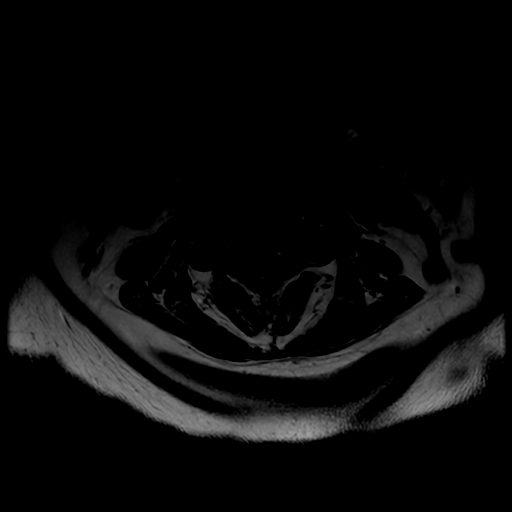
[im 25/38]
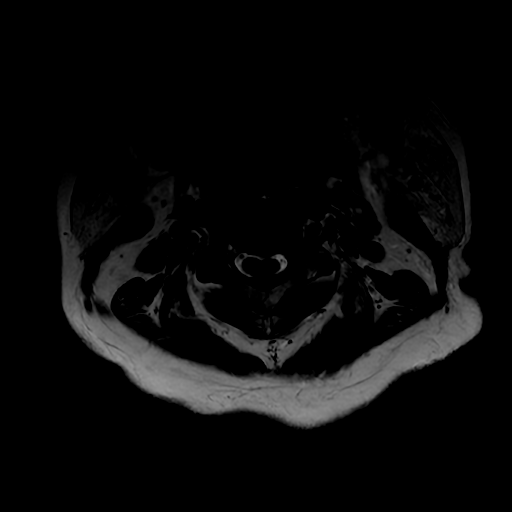
[im 33/38]
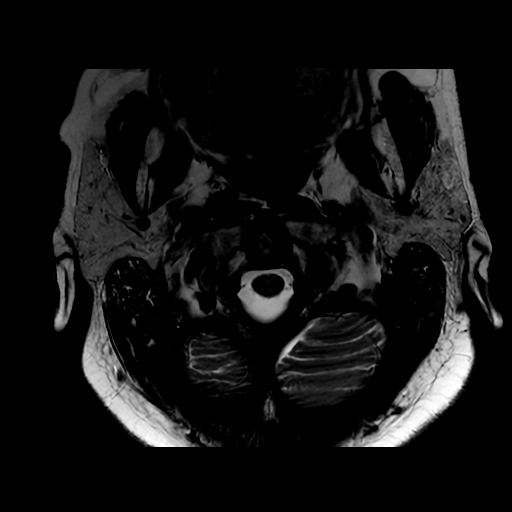
[im 38/38]
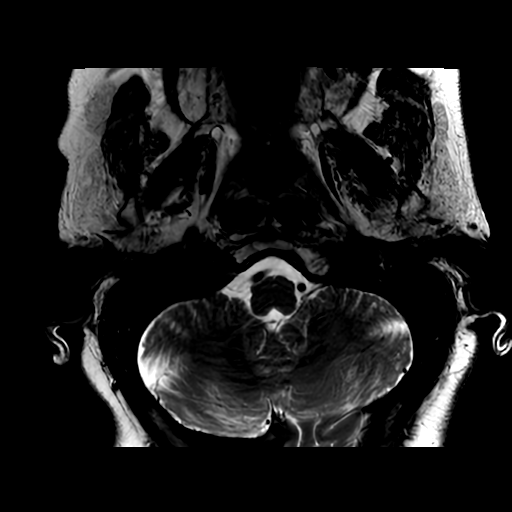

[19 of 48 positions shown; findings below may reference images not displayed]

FINDINGS: Alignment: Physiologic.

Vertebrae: No fracture, evidence of discitis, or bone lesion.

Cord: Cord deformity of the C3-4 level.  Mild edema.

Posterior Fossa, vertebral arteries, paraspinal tissues: Negative.

Disc levels:

C1-2: Unremarkable.

C2-3: Normal disc space and facet joints. There is no spinal canal
stenosis. No neural foraminal stenosis.

C3-4: Large central disc extrusion with superior migration. Severe
spinal canal stenosis. There is cord compression. Mild left neural
foraminal stenosis.

C4-5: Medium-sized disc bulge with uncovertebral hypertrophy.
Moderate spinal canal stenosis. Mild right and severe left neural
foraminal stenosis.

C5-6: Left-greater-than-right uncovertebral hypertrophy. There is no
spinal canal stenosis. Moderate left neural foraminal stenosis.

C6-7: Normal disc space and facet joints. There is no spinal canal
stenosis. No neural foraminal stenosis.

C7-T1: Normal disc space and facet joints. There is no spinal canal
stenosis. No neural foraminal stenosis.
IMPRESSION: 1. Large central disc extrusion with superior migration at C3-4
causes cord compression and mild edema.
2. Moderate C4-5 spinal canal stenosis and severe left neural
foraminal stenosis.
3. Moderate left C5-6 neural foraminal stenosis.

Critical Value/emergent results were called by telephone at the time
of interpretation on [DATE] at [DATE] to provider SUI WAH
SUI WAH, who verbally acknowledged these results.

## 2022-02-23 IMAGING — CR DG KNEE COMPLETE 4+V*L*
4 series · 4 of 4 positions shown · non-contrast
Comparison: None Available.

CLINICAL DATA: Left-sided pain.

EXAM:
LEFT KNEE - COMPLETE 4+ VIEW

[knee ap]
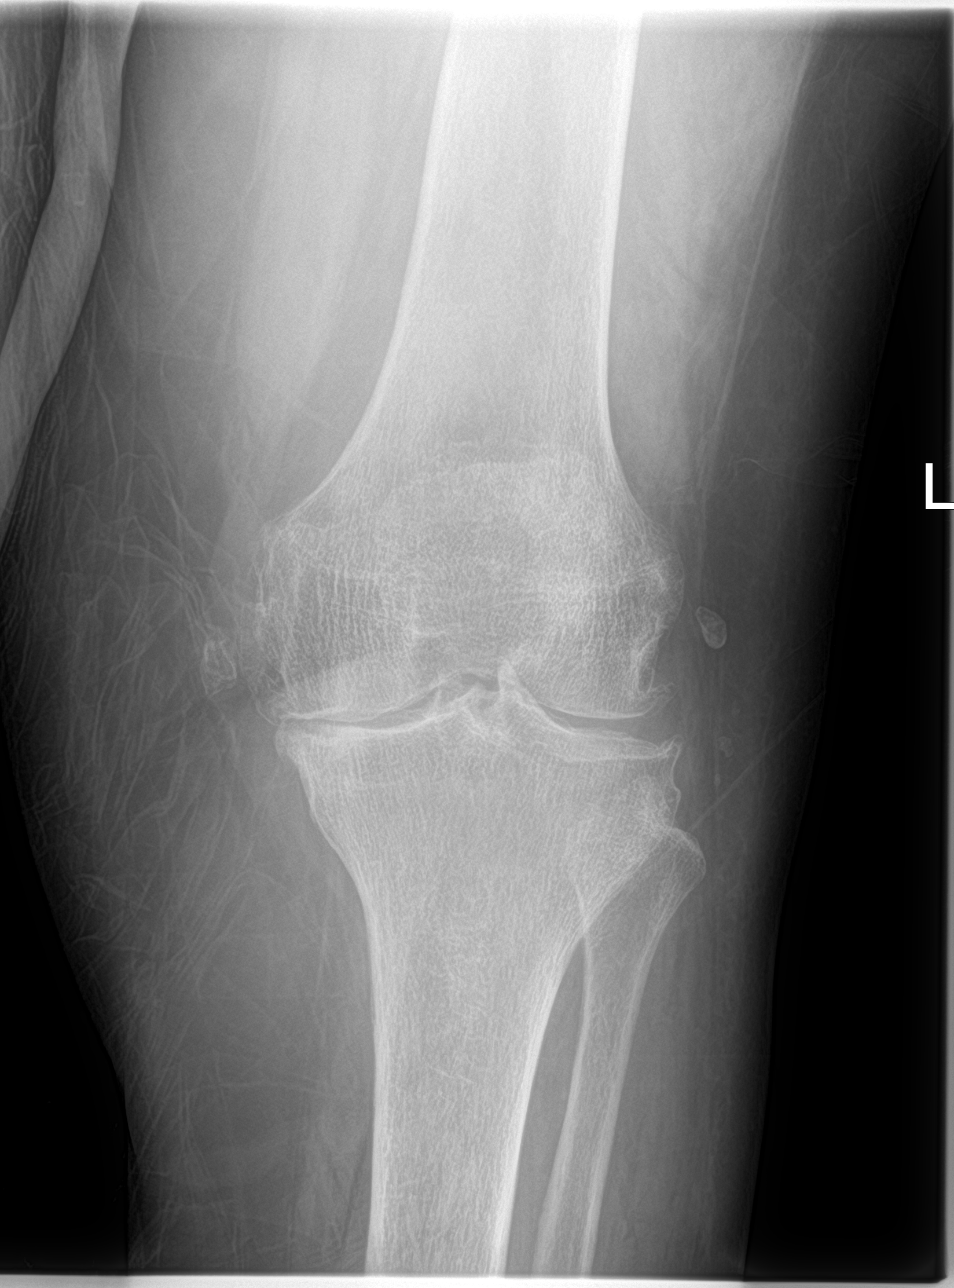

[knee lat]
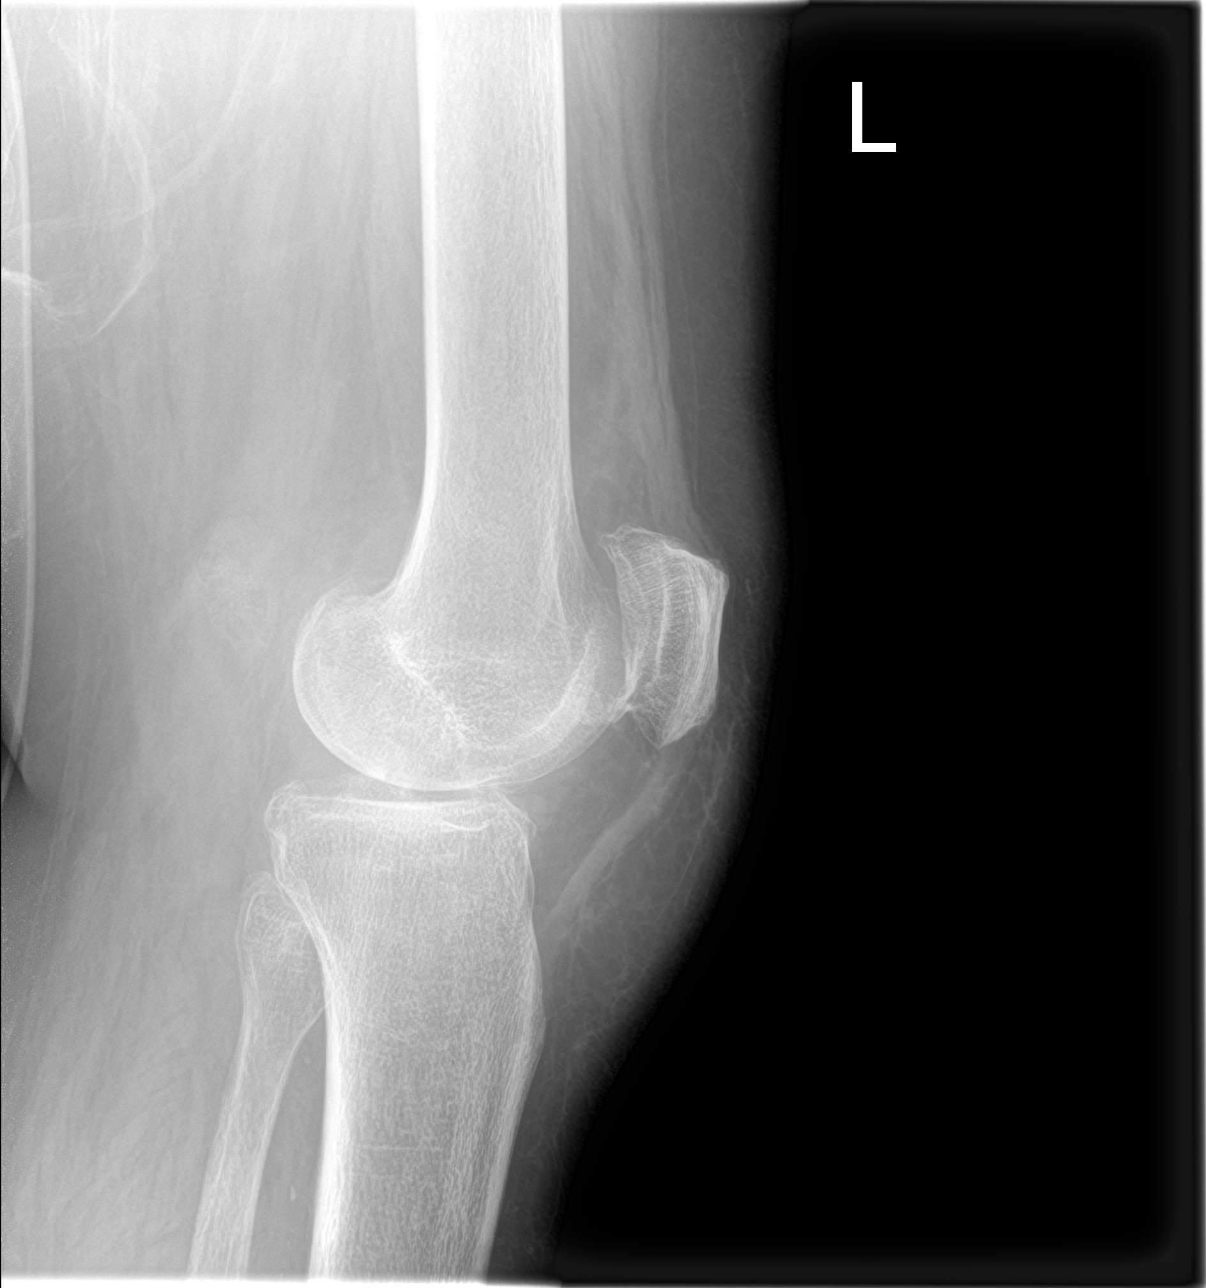

[knee obl (1 of 2)]
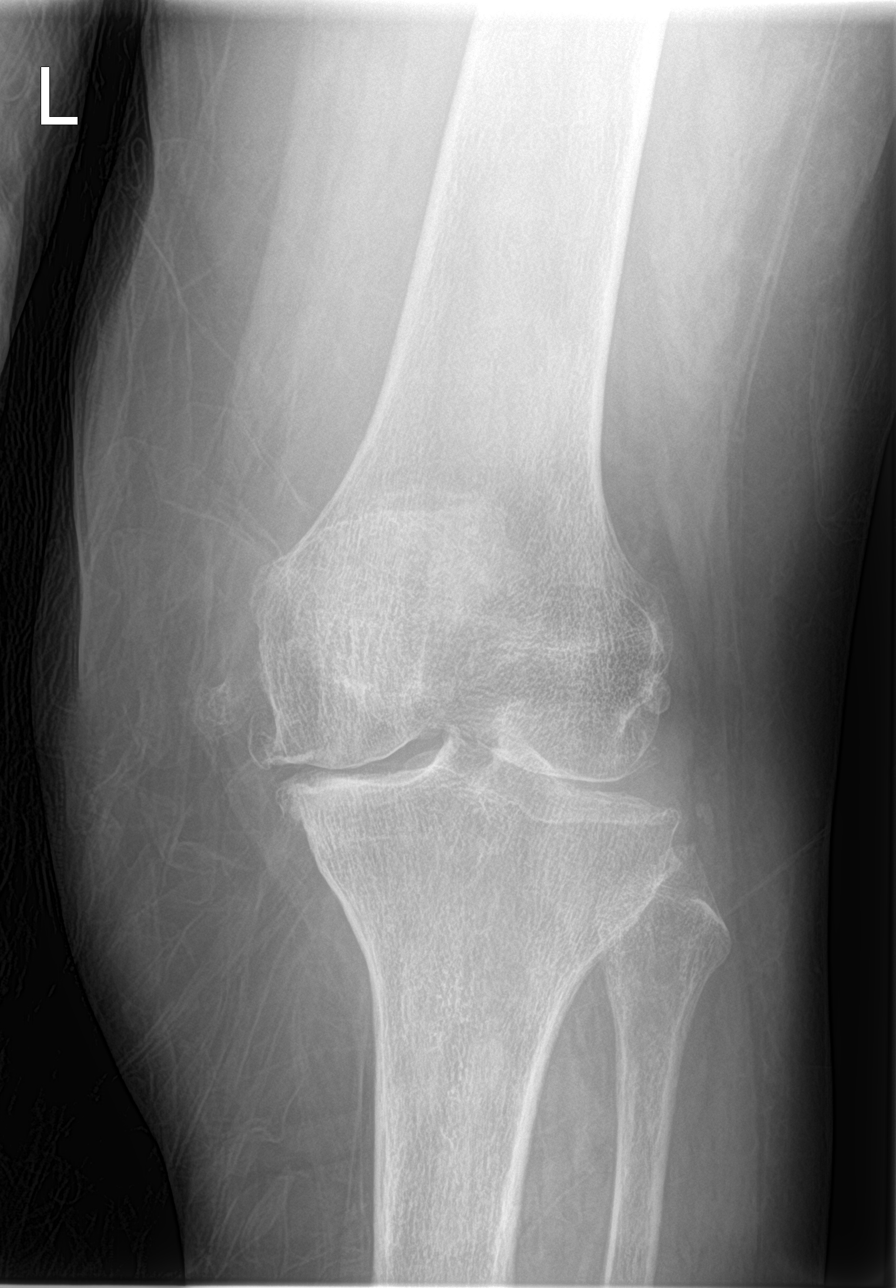

[knee obl (2 of 2)]
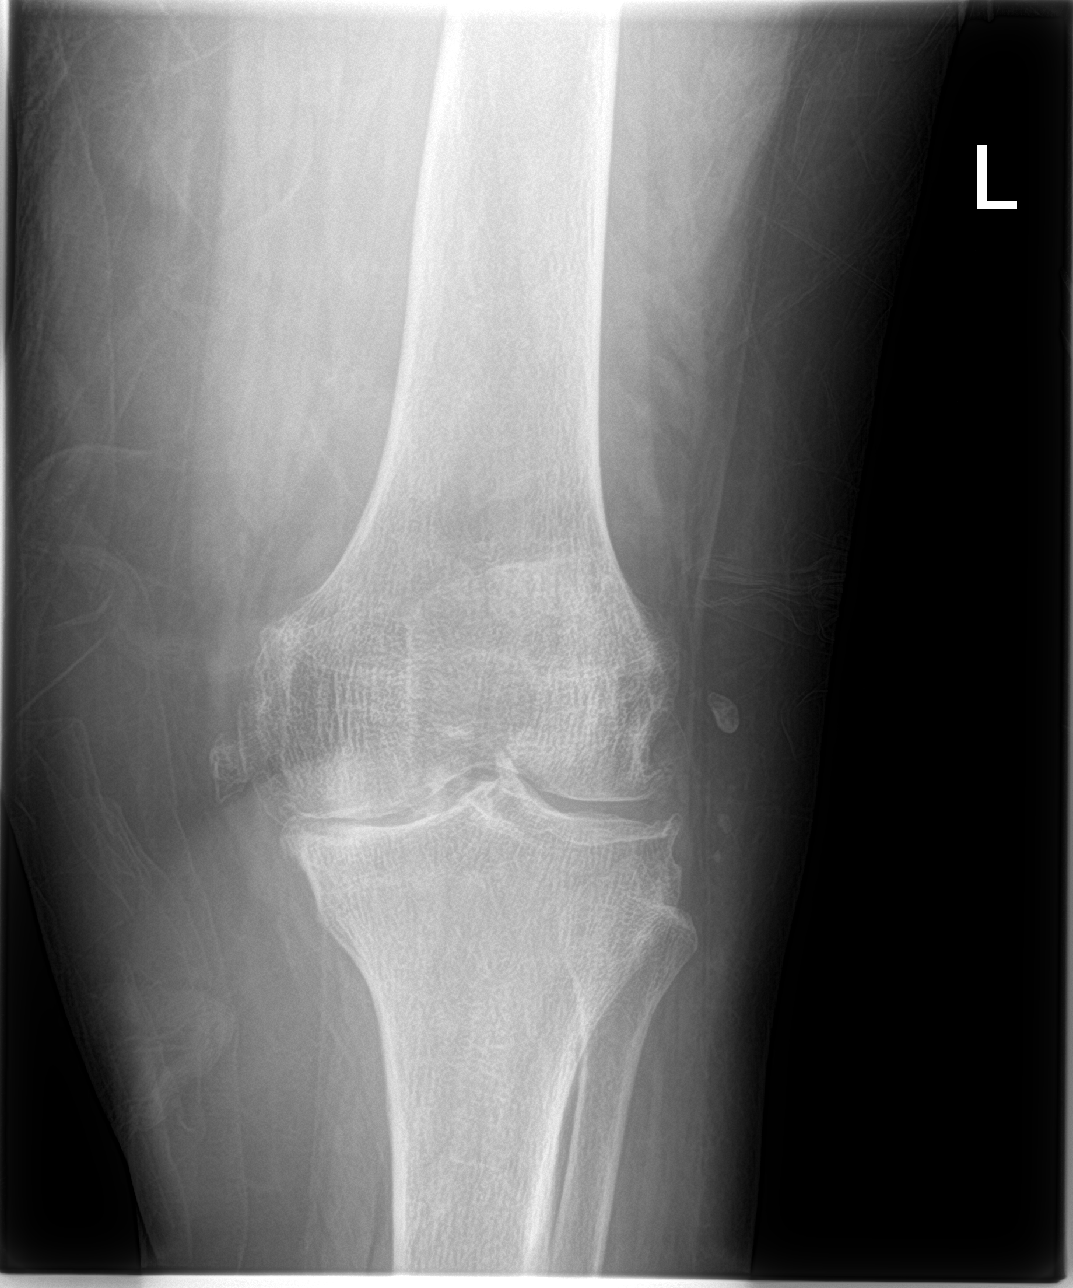

[4 of 4 positions shown; findings below may reference images not displayed]

FINDINGS: There is no evidence for fracture, dislocation or joint effusion.
There is tricompartmental osteophyte formation with spurring of the
tibial spines. There is moderate medial compartment joint space
narrowing. There are some nonspecific soft tissue calcifications
medial and lateral to the knee joint. Soft tissues are within normal
limits.
IMPRESSION: 1. No acute bony abnormality.
2. Moderate tricompartmental osteoarthrosis.

## 2022-02-23 IMAGING — CT CT CERVICAL SPINE W/O CM
4 series · 15 of 33 positions shown, 18 images · non-contrast
Comparison: None Available.

CLINICAL DATA: Trauma, fall



[Series 4: c_spine 2.0 st · axial · 0.34mm/px · z∈[+1052,+1172]mm · 5 of 91 slices shown, 7 images]
[im 16/91  soft-tissue]
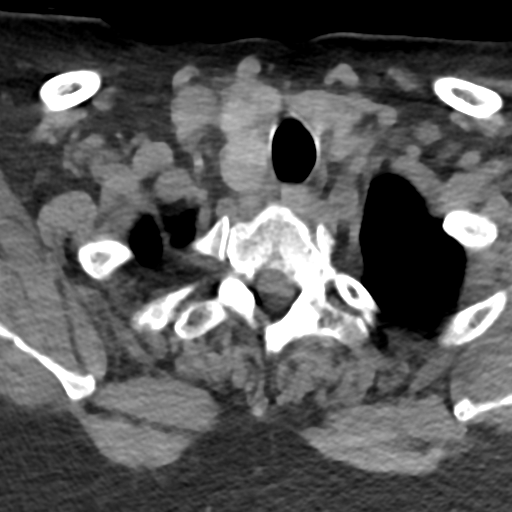
[im 16/91  bone]
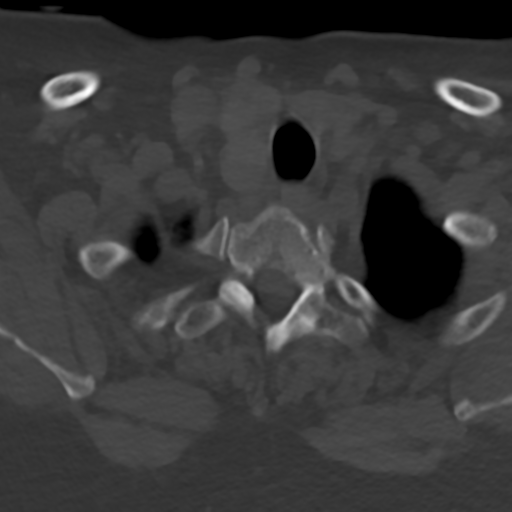
[im 31/91  bone]
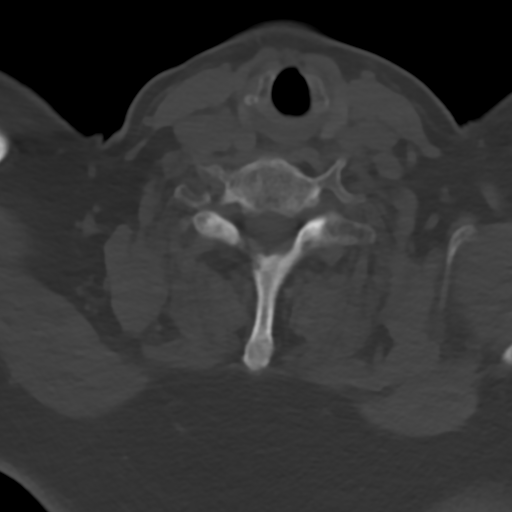
[im 46/91  bone]
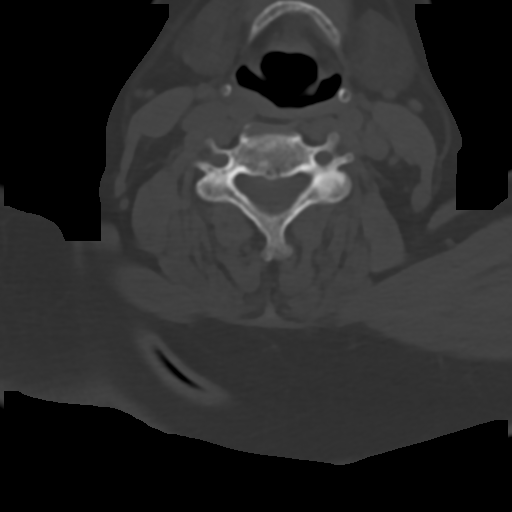
[im 61/91  bone]
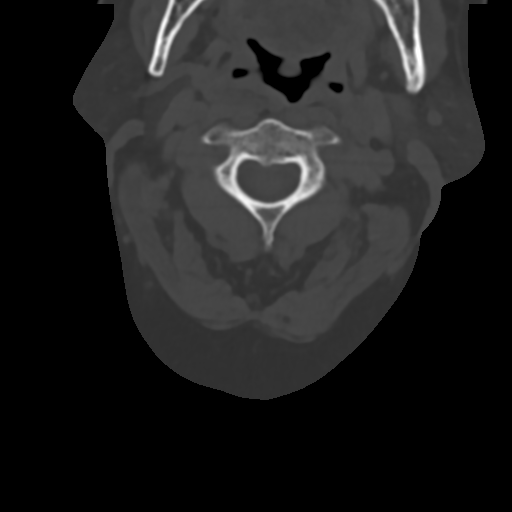
[im 76/91  soft-tissue]
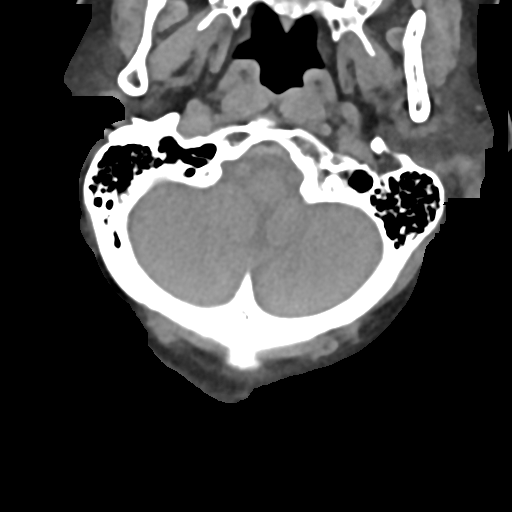
[im 76/91  bone]
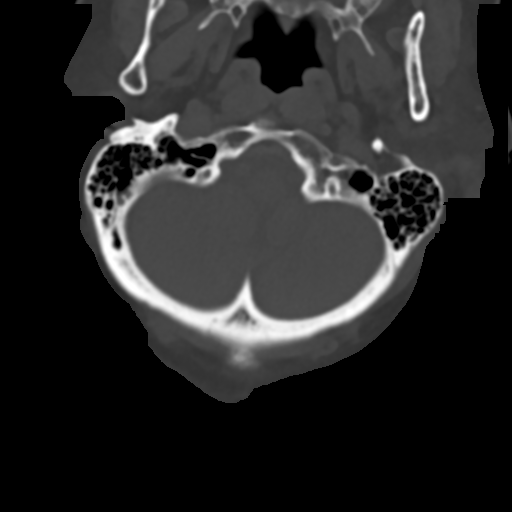

[Series 6: c_spine 2.0 sag bone · sagittal · 0.32mm/px · 5 of 86 slices shown, 6 images]
[im 29/86  bone]
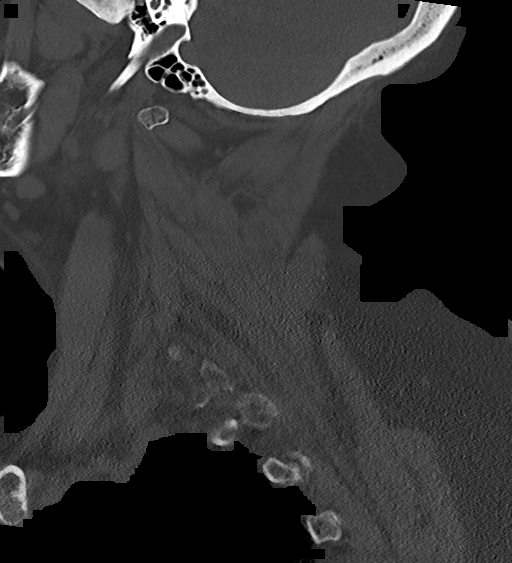
[im 36/86  bone]
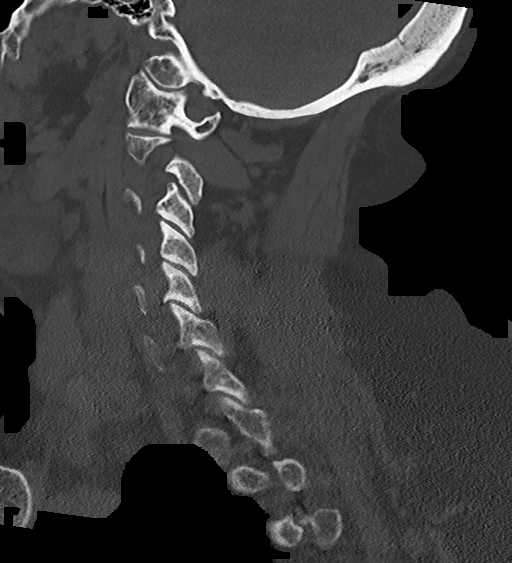
[im 43/86  soft-tissue]
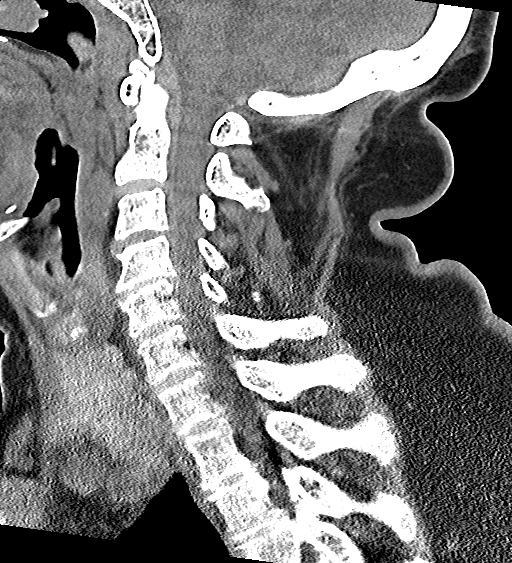
[im 43/86  bone]
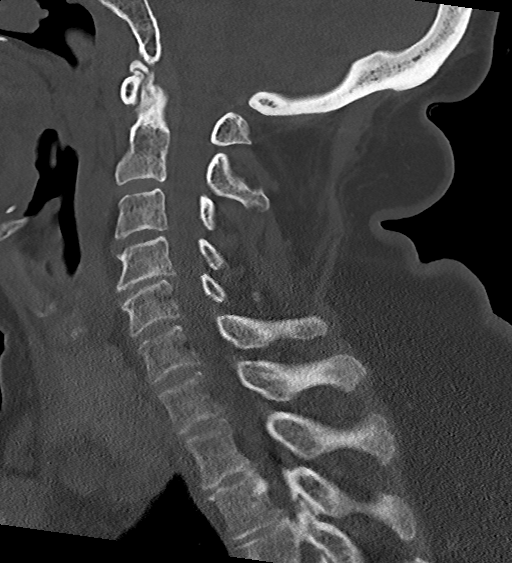
[im 50/86  bone]
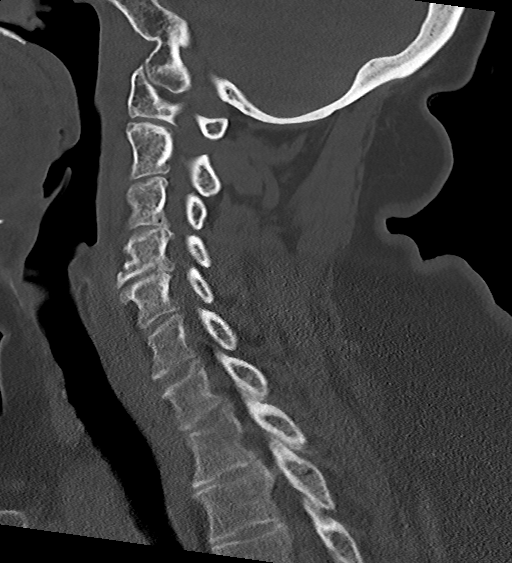
[im 57/86  bone]
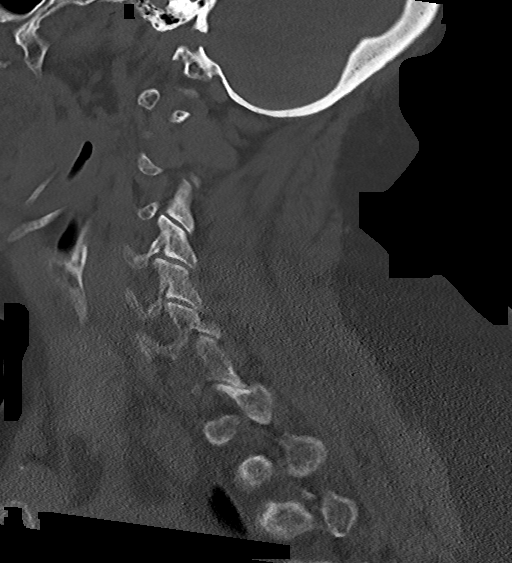

[Series 7: c_spine 2.0 cor bone · coronal · 0.26mm/px · 3 of 68 slices shown]
[im 14/68  bone]
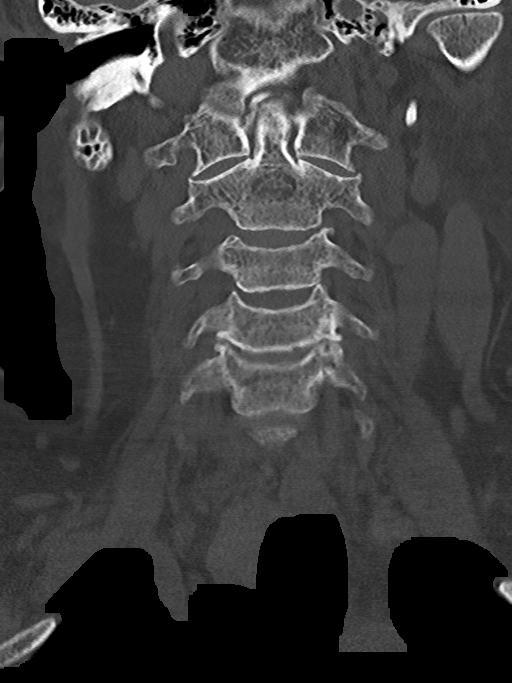
[im 27/68  bone]
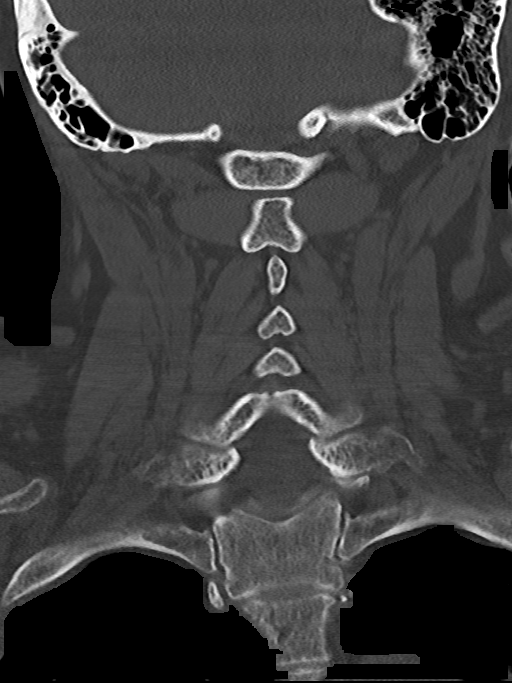
[im 41/68  bone]
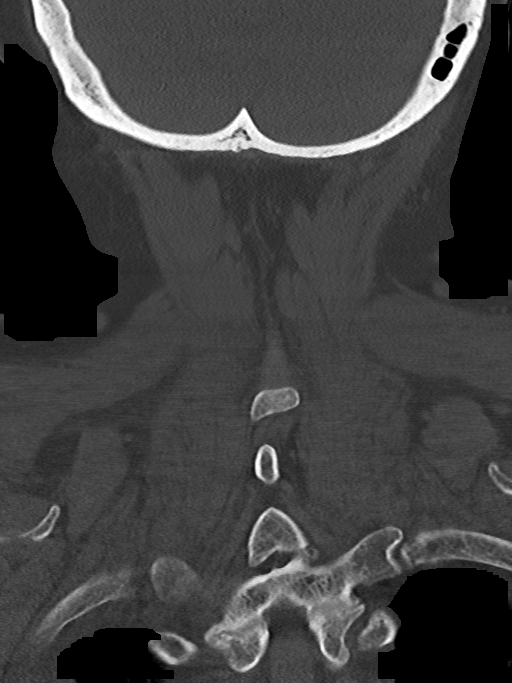

[Series 8: c_spine 2.0 orthogonals · axial · 0.21mm/px · z∈[+1030,+1058]mm · 2 of 88 slices shown]
[im 15/88  bone]
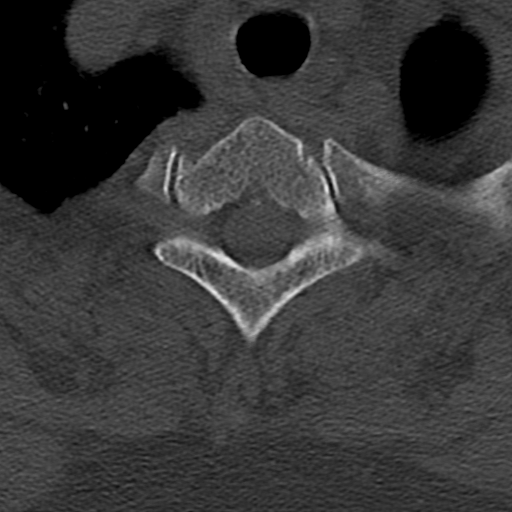
[im 30/88  bone]
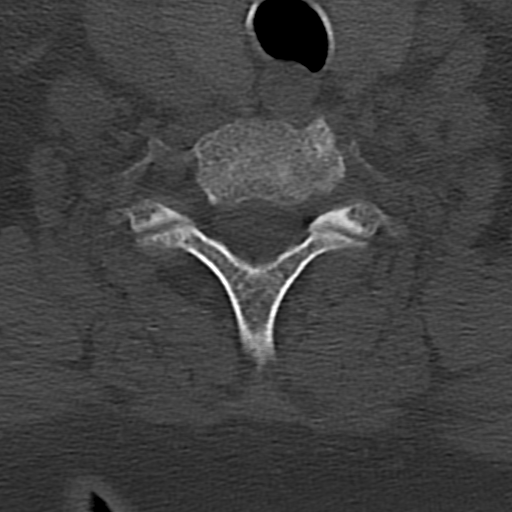

[15 of 33 positions shown; findings below may reference images not displayed]

FINDINGS: Alignment: Alignment of posterior margins of vertebral bodies is
unremarkable.

Skull base and vertebrae: No recent fracture is seen. Degenerative
changes are noted in the cervical spine, more prominent at C4-C5
level.

Soft tissues and spinal canal: Posterior bony spurs are causing
extrinsic pressure over the ventral margin of thecal sac, more so at
C4-C5 level.

Disc levels: There is encroachment of neural foramina by bony spurs
from C2-C7 levels.

Upper chest: Unremarkable.

Other: There is inhomogeneous attenuation in the thyroid. Right lobe
of thyroid is larger than left.
IMPRESSION: No recent fracture is seen. Cervical spondylosis with encroachment
of neural foramina from C2-C7 levels.

## 2022-02-23 IMAGING — CT CT HEAD W/O CM
4 of 5 series · 15 of 47 positions shown, 17 images · non-contrast
Comparison: [DATE]

CLINICAL DATA: Trauma, fall



[Series 3: head without · axial · non-contrast · 0.41mm/px · z∈[+1186,+1276]mm · 4 of 31 slices shown]
[im 7/31  brain]
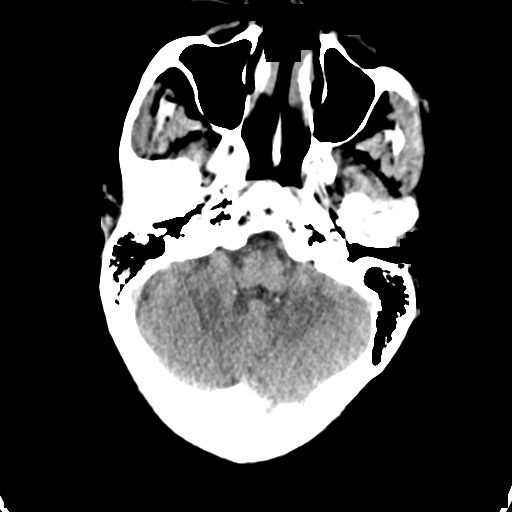
[im 13/31  brain]
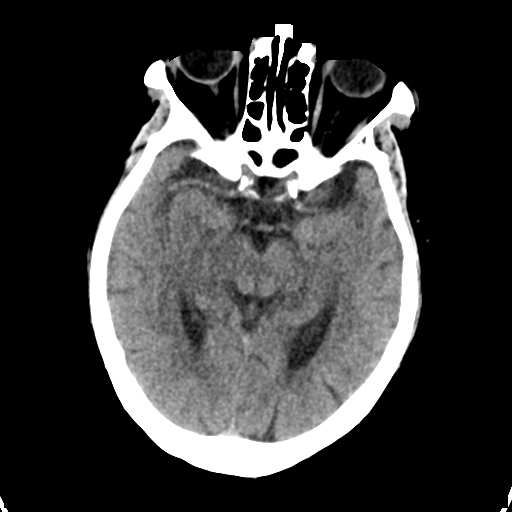
[im 19/31  brain]
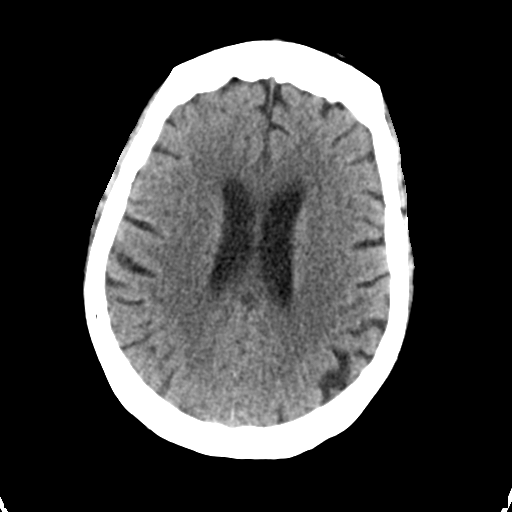
[im 25/31  brain]
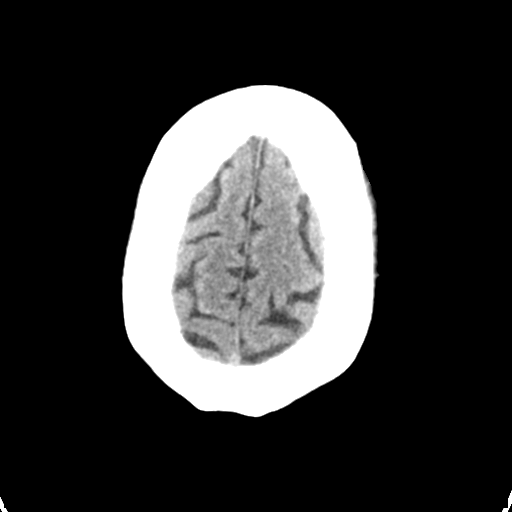

[Series 5: head without cor · coronal · non-contrast · 0.31mm/px · 3 of 67 slices shown]
[im 23/67  brain]
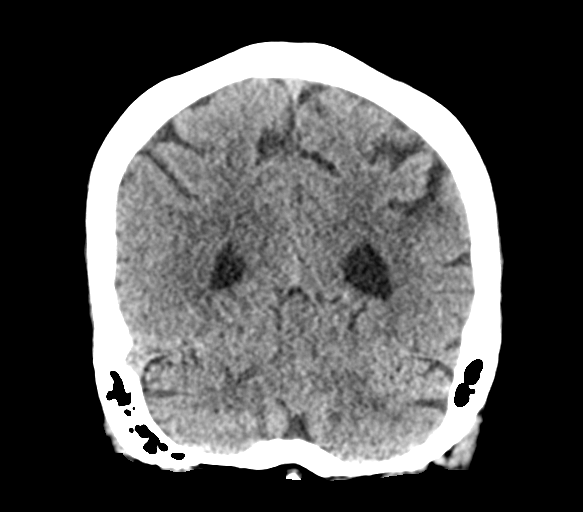
[im 30/67  brain]
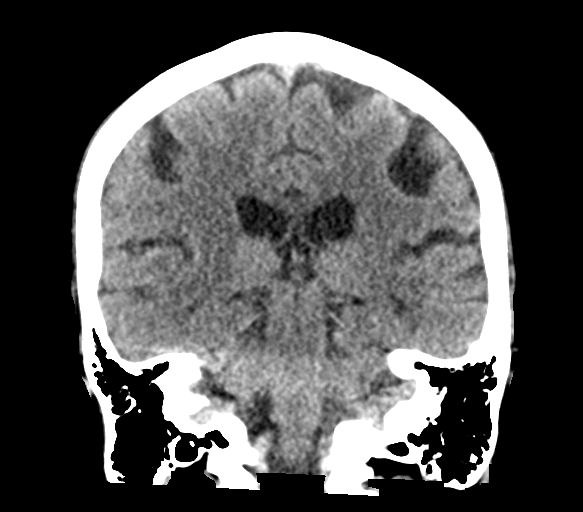
[im 37/67  brain]
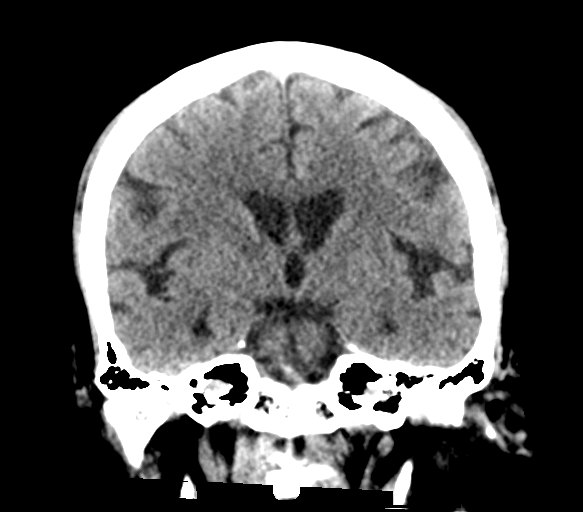

[Series 6: head without sag · sagittal · non-contrast · 0.30mm/px · 3 of 56 slices shown]
[im 19/56  brain]
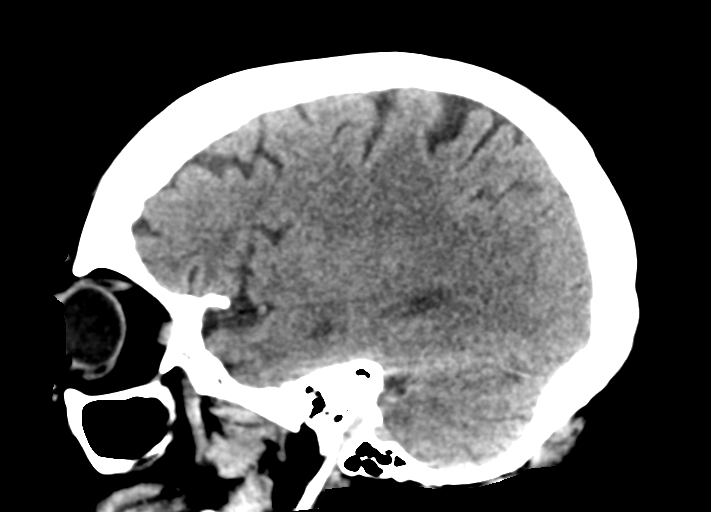
[im 28/56  brain]
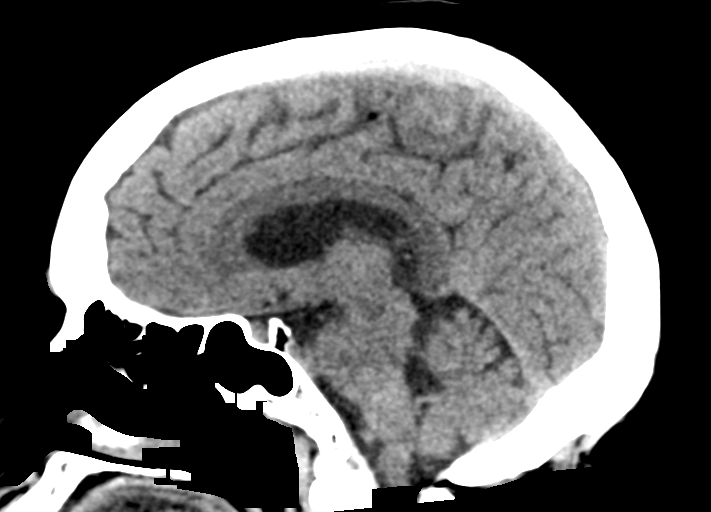
[im 37/56  brain]
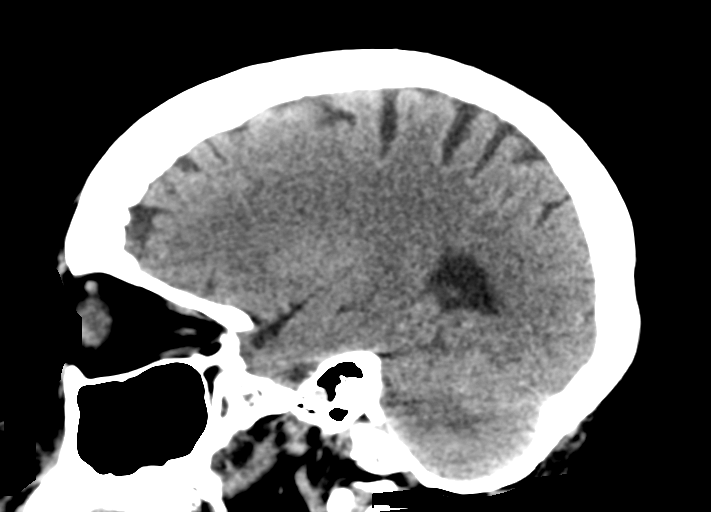

[Series 7: head without ax · axial · non-contrast · 0.33mm/px · z∈[+1181,+1280]mm · 5 of 30 slices shown, 7 images]
[im 5/30  brain]
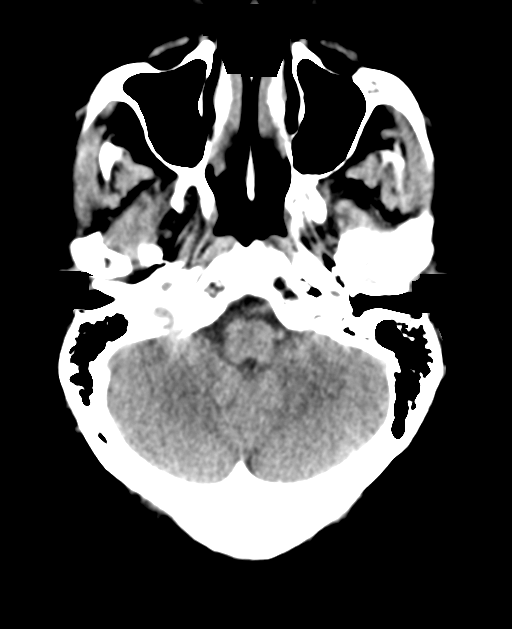
[im 5/30  bone]
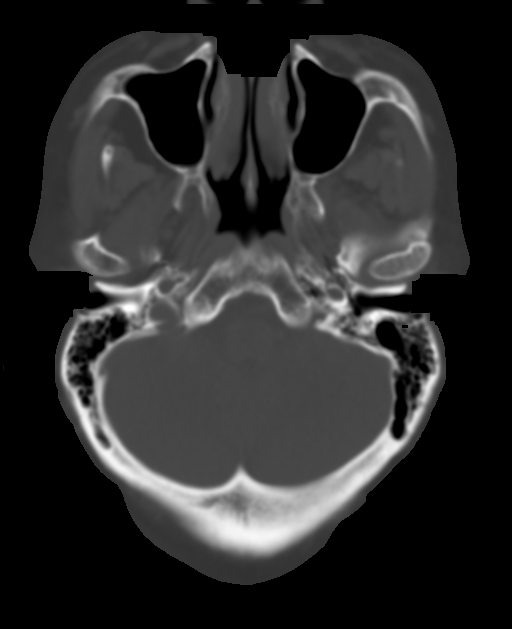
[im 10/30  brain]
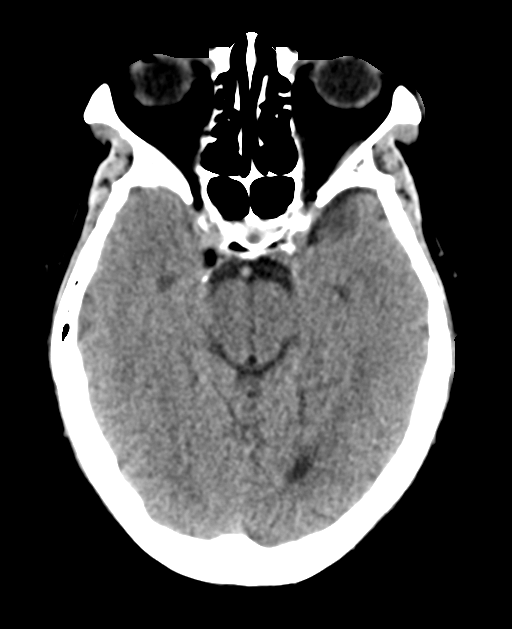
[im 15/30  brain]
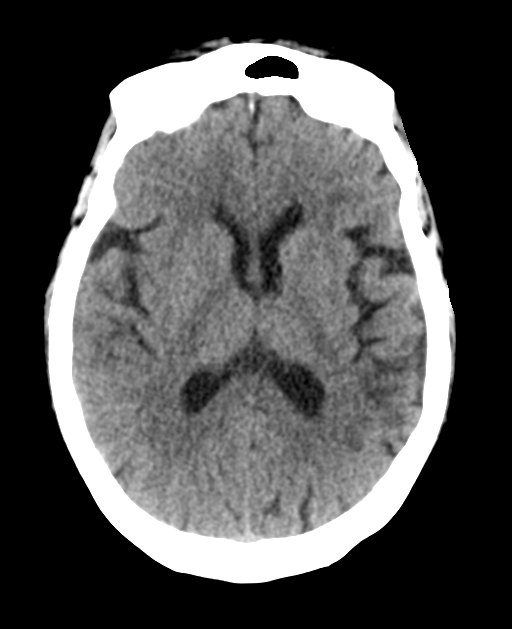
[im 20/30  brain]
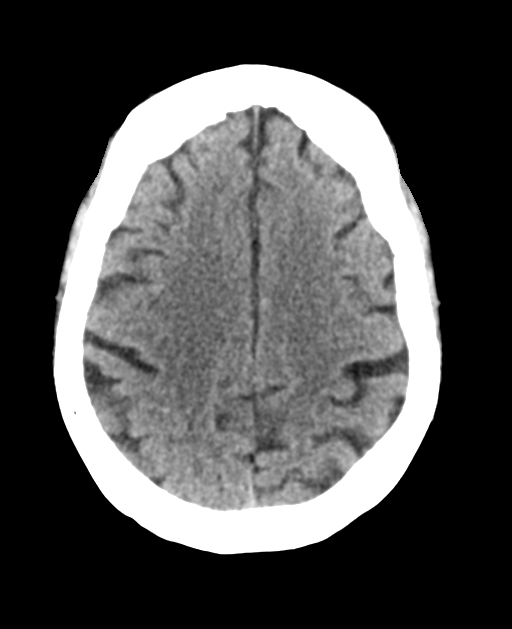
[im 25/30  brain]
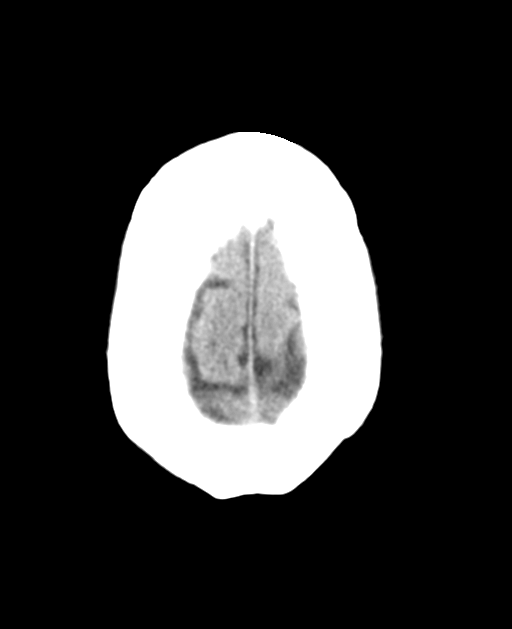
[im 25/30  bone]
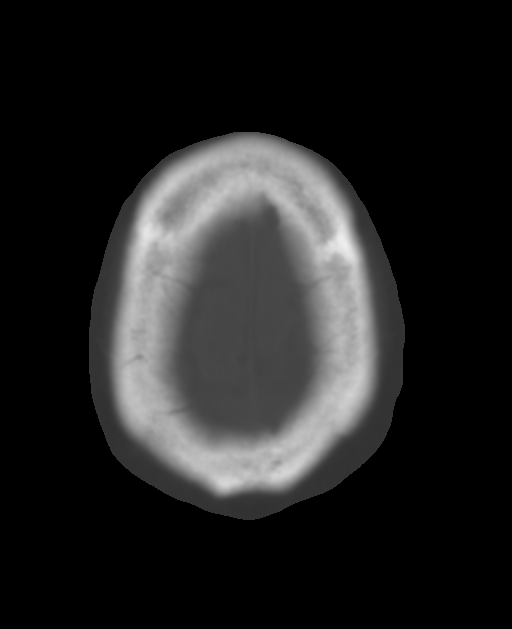

[15 of 47 positions shown; findings below may reference images not displayed]

FINDINGS: Brain: No acute intracranial findings are seen in noncontrast CT
brain. There are no signs of bleeding. Cortical sulci are prominent.

Vascular: Unremarkable.

Skull: Unremarkable.

Sinuses/Orbits: There is mild mucosal thickening in the ethmoid
sinus. Frontal sinus is hypoplastic.

Other: None
IMPRESSION: No acute intracranial findings are seen in noncontrast CT brain.
Atrophy.

Mild chronic ethmoid sinusitis.

## 2022-02-23 IMAGING — CR DG HIP (WITH OR WITHOUT PELVIS) 2-3V*L*
3 series · 3 of 3 positions shown · non-contrast
Comparison: None Available.

CLINICAL DATA: Left-sided pain.

EXAM:
DG HIP (WITH OR WITHOUT PELVIS) 2-3V LEFT

[pelvis ap]
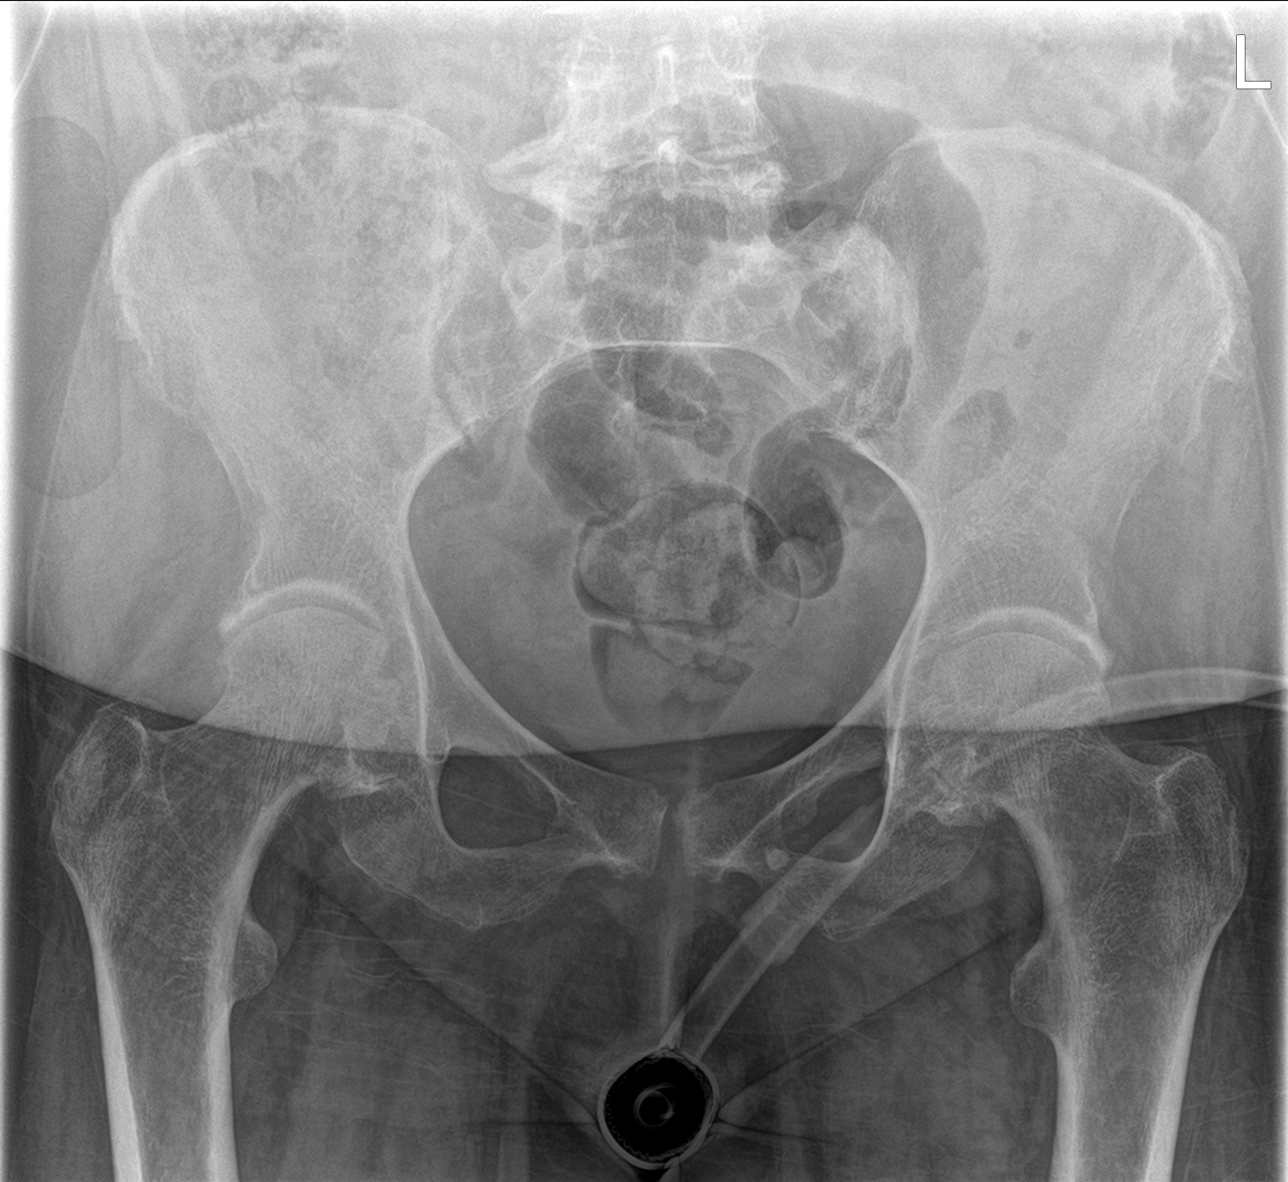

[hip ap]
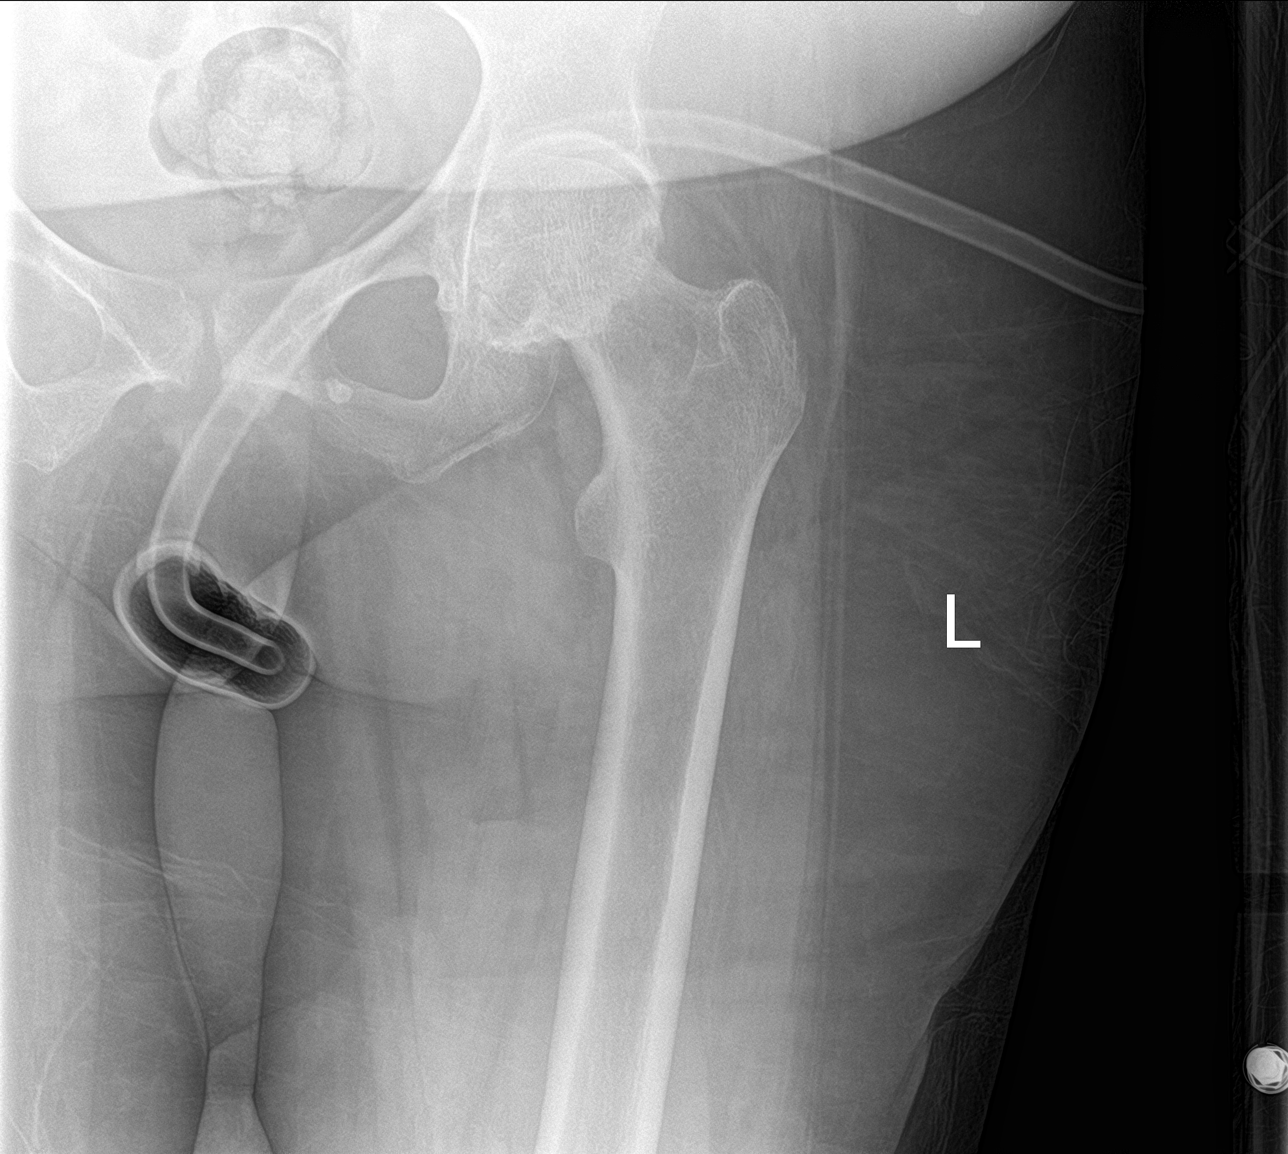

[hip lat]
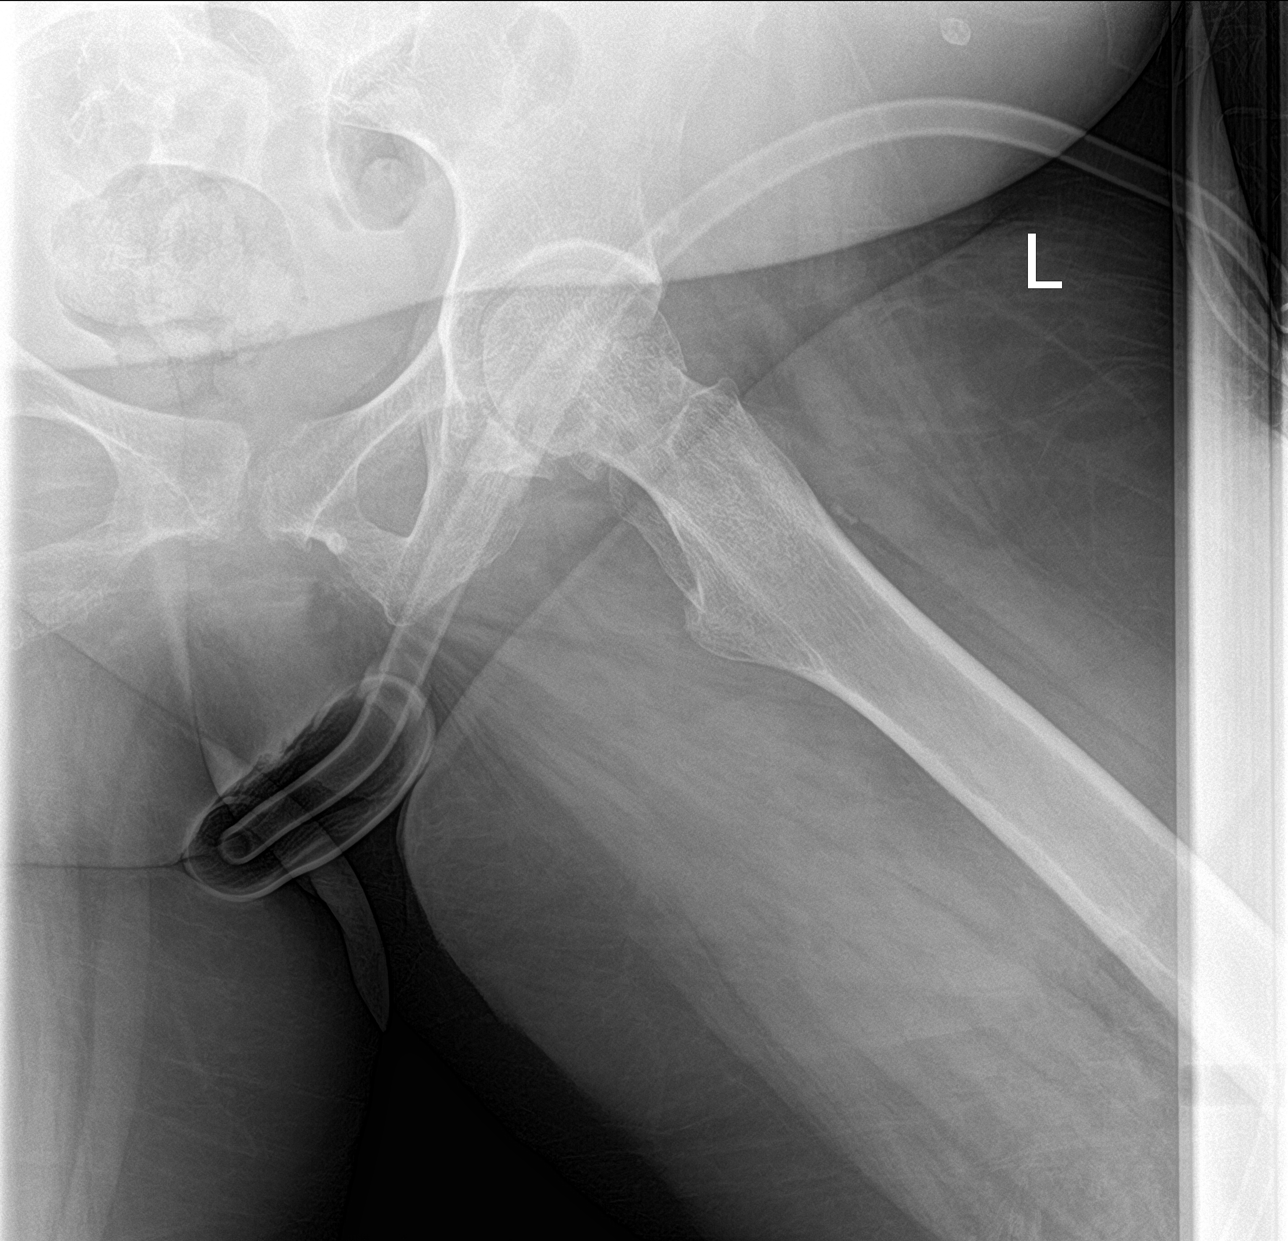

[3 of 3 positions shown; findings below may reference images not displayed]

FINDINGS: Bones are osteopenic. On frogleg lateral view there is a linear
lucency in the subcapital region which may represent a nondisplaced
fracture. No dislocation. There are mild-to-moderate degenerative
changes of both hips. Soft tissues are within normal limits.
IMPRESSION: 1. Can not exclude subtle subcapital left femoral neck fracture.
Please correlate clinically. Consider CT.
2. Diffuse osteopenia.
3. Moderate degenerative changes of both hips.

## 2022-02-23 IMAGING — MR MR HEAD W/O CM
6 of 10 series · 29 of 48 positions shown · non-contrast
Comparison: None Available.

CLINICAL DATA: Weakness and falls

EXAM:
MRI HEAD WITHOUT CONTRAST
TECHNIQUE: Multiplanar, multiecho pulse sequences of the brain and surrounding
structures were obtained without intravenous contrast.

[Series 2: DWI · axial · 3.0mm · 0.94mm/px · z∈[-140,+7]mm · 9 of 100 slices shown (1 of 2)]
[im 1/100]
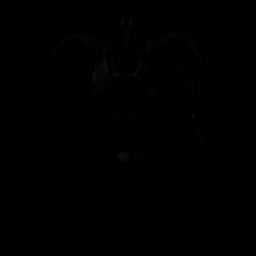
[im 13/100]
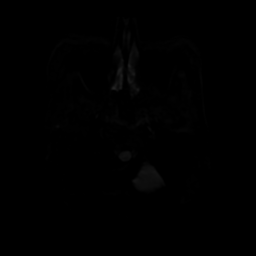
[im 25/100]
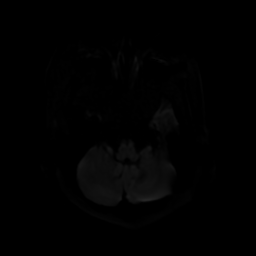
[im 38/100]
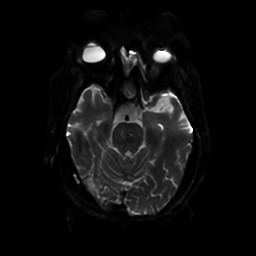
[im 50/100]
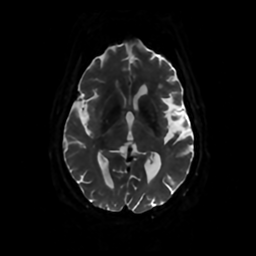
[im 62/100]
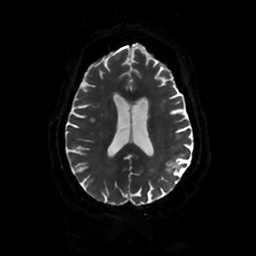
[im 75/100]
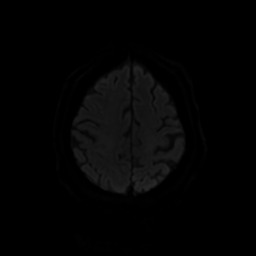
[im 87/100]
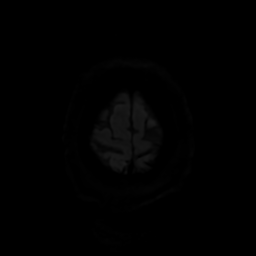
[im 100/100]
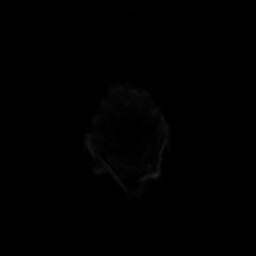

[Series 3: DWI · coronal · 4.0mm · 0.94mm/px · 7 of 74 slices shown (2 of 2)]
[im 1/74]
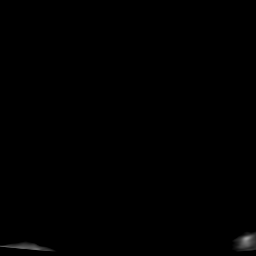
[im 13/74]
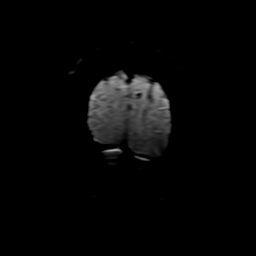
[im 25/74]
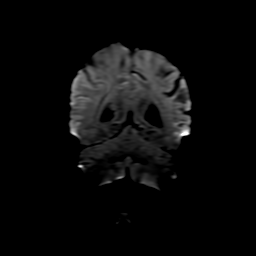
[im 37/74]
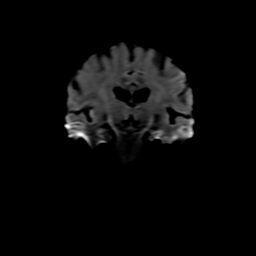
[im 49/74]
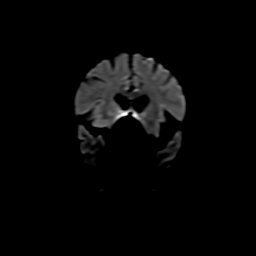
[im 61/74]
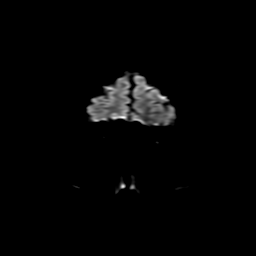
[im 74/74]
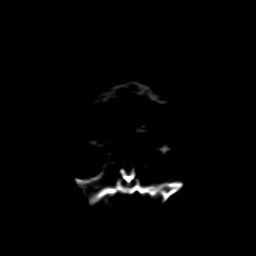

[Series 4: FLAIR · sagittal · 5.0mm · 0.23mm/px · 2 of 24 slices shown (1 of 2)]
[im 1/24]
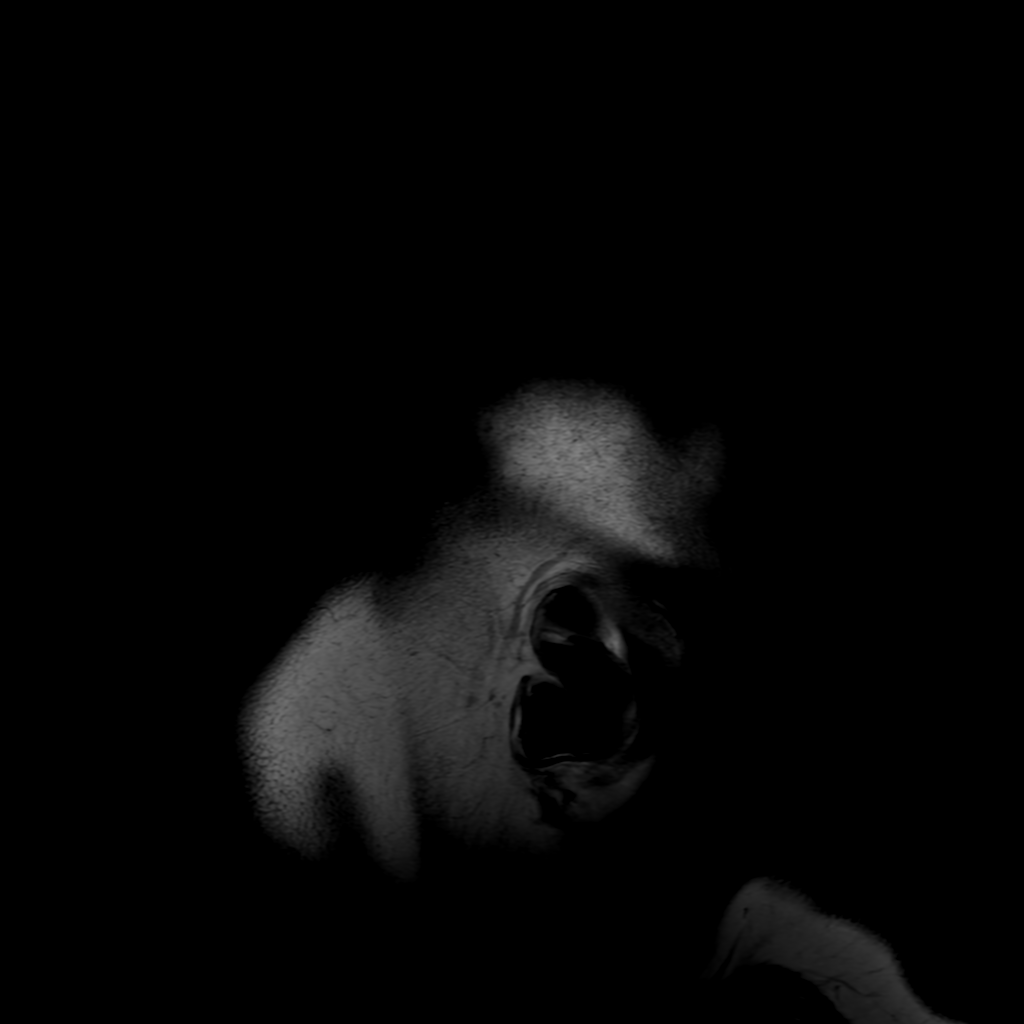
[im 24/24]
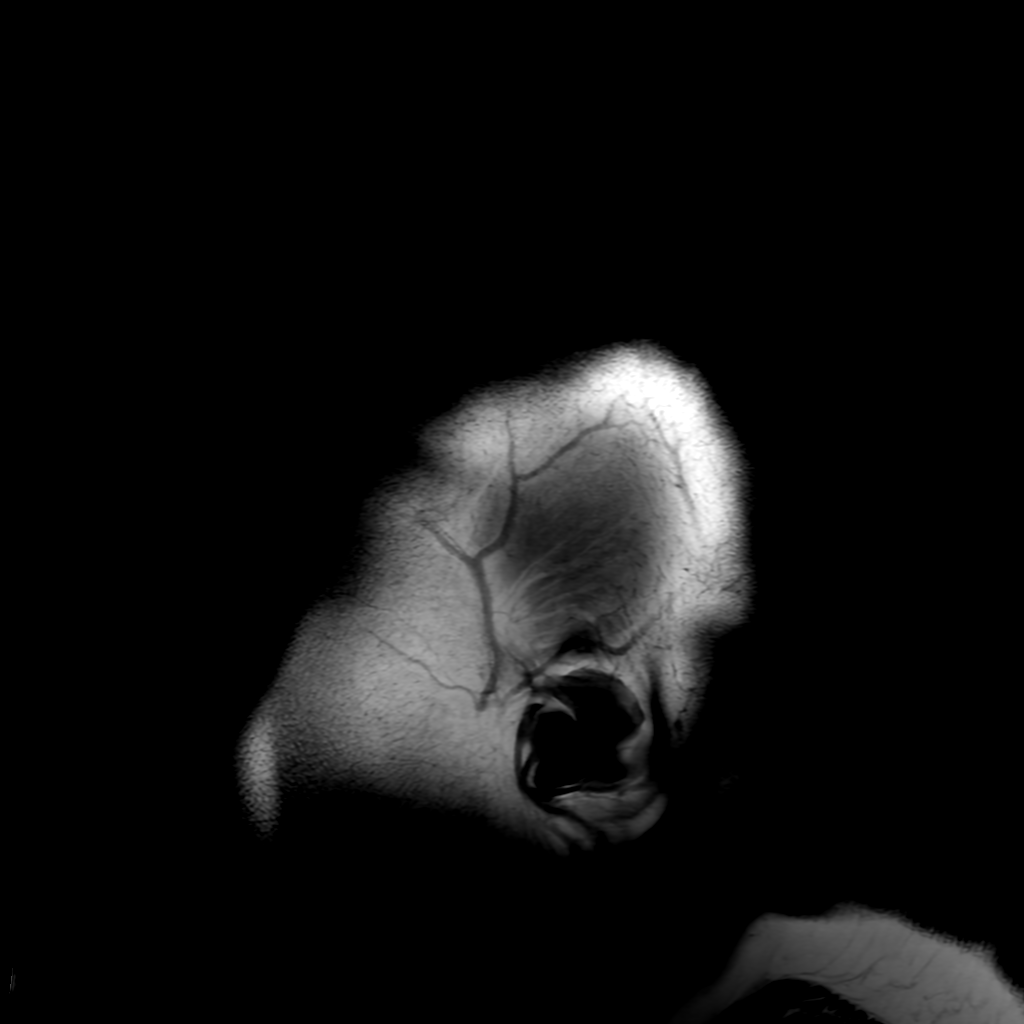

[Series 6: FLAIR · axial · 4.0mm · 0.45mm/px · z∈[-132,+17]mm · 3 of 35 slices shown (2 of 2)]
[im 1/35]
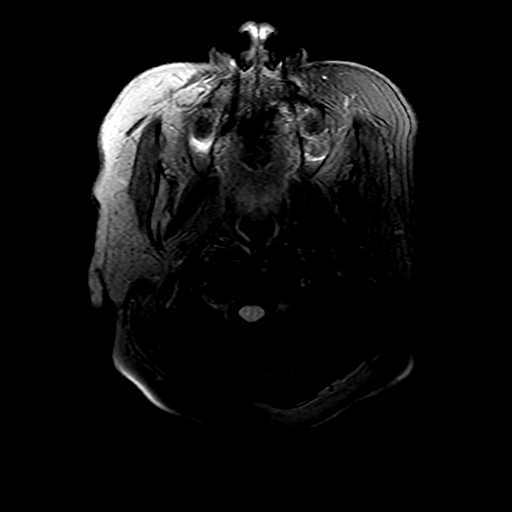
[im 18/35]
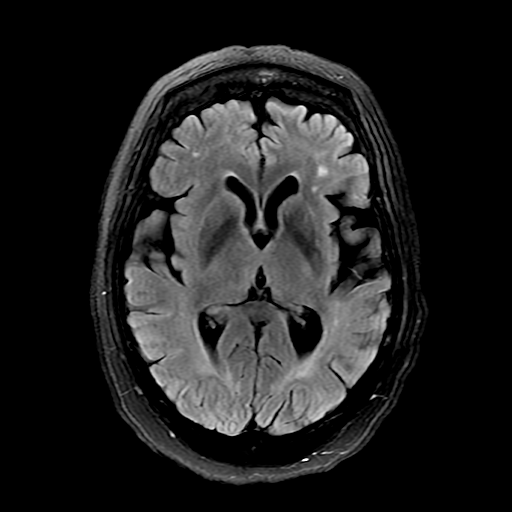
[im 35/35]
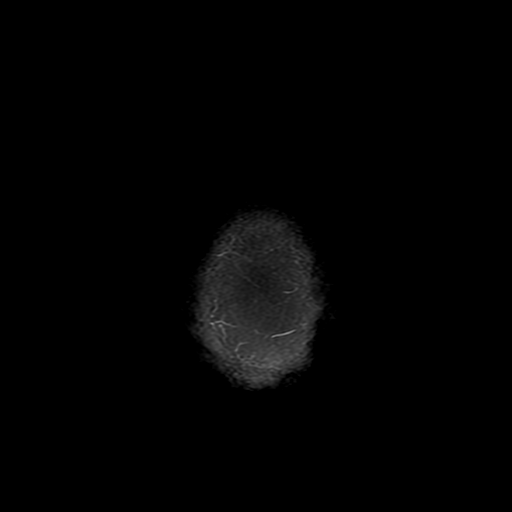

[Series 250: ADC · axial · 3.0mm · 0.94mm/px · z∈[-140,+7]mm · 5 of 50 slices shown (1 of 2)]
[im 1/50]
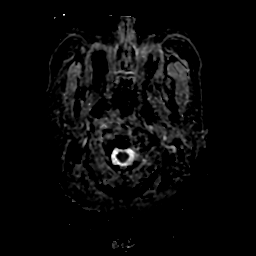
[im 13/50]
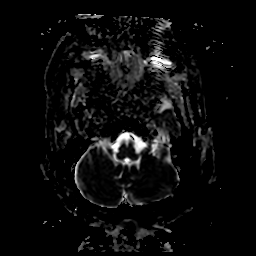
[im 25/50]
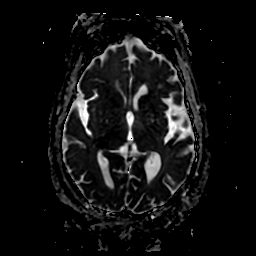
[im 37/50]
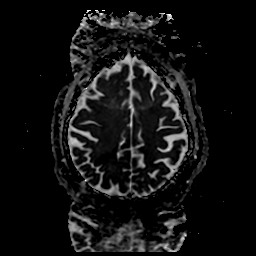
[im 50/50]
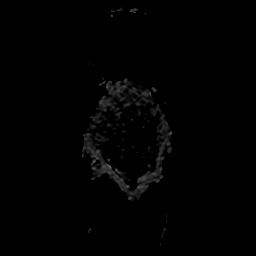

[Series 350: ADC · coronal · 4.0mm · 0.94mm/px · 3 of 36 slices shown (2 of 2)]
[im 1/36]
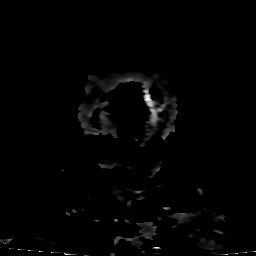
[im 18/36]
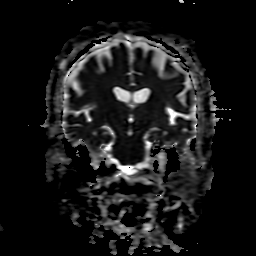
[im 36/36]
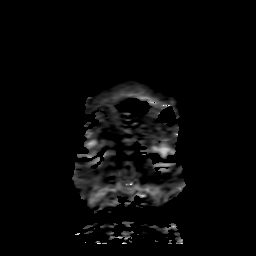

[29 of 48 positions shown; findings below may reference images not displayed]

FINDINGS: Brain: No acute infarct, mass effect or extra-axial collection.
Chronic blood products over the posterior left hemisphere. There is
multifocal hyperintense T2-weighted signal within the white matter.
Generalized cerebral volume loss. Old left parietal infarct. The
midline structures are normal.

Vascular: Major flow voids are preserved.

Skull and upper cervical spine: Normal calvarium and skull base.
Visualized upper cervical spine and soft tissues are normal.

Sinuses/Orbits:No paranasal sinus fluid levels or advanced mucosal
thickening. No mastoid or middle ear effusion. Normal orbits.
IMPRESSION: 1. No acute intracranial abnormality.
2. Old left parietal infarct and findings of chronic small vessel
disease.

## 2022-02-23 MED ORDER — ENOXAPARIN SODIUM 40 MG/0.4ML IJ SOSY: 40.0000 mg | PREFILLED_SYRINGE | INTRAMUSCULAR | Status: DC

## 2022-02-23 MED ORDER — ATENOLOL 50 MG PO TABS
100.0000 mg | ORAL_TABLET | Freq: Every day | ORAL | Status: DC
  Administered 2022-02-23: 100 mg via ORAL
  Filled 2022-02-23: qty 2

## 2022-02-23 MED ORDER — STROKE: EARLY STAGES OF RECOVERY BOOK: Freq: Once | Status: DC

## 2022-02-23 MED ORDER — ACETAMINOPHEN 160 MG/5ML PO SOLN: 650.0000 mg | ORAL | Status: DC | PRN

## 2022-02-23 MED ORDER — DULOXETINE HCL 30 MG PO CPEP
30.0000 mg | ORAL_CAPSULE | Freq: Every day | ORAL | Status: DC
Start: 2022-02-23 — End: 2022-03-04
  Administered 2022-02-23 – 2022-03-04 (×9): 30 mg via ORAL
  Filled 2022-02-23 (×11): qty 1

## 2022-02-23 MED ORDER — PANTOPRAZOLE SODIUM 40 MG PO TBEC
40.0000 mg | DELAYED_RELEASE_TABLET | Freq: Every day | ORAL | Status: DC
  Administered 2022-02-23 – 2022-03-04 (×9): 40 mg via ORAL
  Filled 2022-02-23 (×9): qty 1

## 2022-02-23 MED ORDER — PRAVASTATIN SODIUM 40 MG PO TABS
40.0000 mg | ORAL_TABLET | Freq: Every day | ORAL | Status: DC
  Administered 2022-02-23: 40 mg via ORAL
  Filled 2022-02-23: qty 1

## 2022-02-23 MED ORDER — ACETAMINOPHEN 325 MG PO TABS
650.0000 mg | ORAL_TABLET | ORAL | Status: DC | PRN
  Administered 2022-02-23 – 2022-02-25 (×2): 650 mg via ORAL
  Filled 2022-02-23 (×2): qty 2

## 2022-02-23 MED ORDER — CLONIDINE HCL 0.1 MG PO TABS
0.1000 mg | ORAL_TABLET | Freq: Two times a day (BID) | ORAL | Status: DC
  Administered 2022-02-23 – 2022-03-03 (×15): 0.1 mg via ORAL
  Filled 2022-02-23 (×15): qty 1

## 2022-02-23 MED ORDER — QUETIAPINE FUMARATE 50 MG PO TABS
50.0000 mg | ORAL_TABLET | Freq: Every day | ORAL | Status: DC
  Administered 2022-02-23 – 2022-03-03 (×9): 50 mg via ORAL
  Filled 2022-02-23 (×10): qty 1

## 2022-02-23 MED ORDER — SODIUM CHLORIDE 0.9 % IV SOLN
1.0000 g | INTRAVENOUS | Status: DC
  Administered 2022-02-23 – 2022-02-24 (×2): 1 g via INTRAVENOUS
  Filled 2022-02-23 (×2): qty 10

## 2022-02-23 MED ORDER — TRAZODONE HCL 100 MG PO TABS
100.0000 mg | ORAL_TABLET | Freq: Every day | ORAL | Status: DC
  Administered 2022-02-23 – 2022-03-03 (×9): 100 mg via ORAL
  Filled 2022-02-23 (×4): qty 1
  Filled 2022-02-23: qty 2
  Filled 2022-02-23 (×4): qty 1

## 2022-02-23 MED ORDER — SODIUM CHLORIDE 0.9 % IV SOLN: INTRAVENOUS | Status: DC

## 2022-02-23 MED ORDER — ACETAMINOPHEN 650 MG RE SUPP: 650.0000 mg | RECTAL | Status: DC | PRN

## 2022-02-23 MED ORDER — HYDRALAZINE HCL 50 MG PO TABS
100.0000 mg | ORAL_TABLET | Freq: Two times a day (BID) | ORAL | Status: DC
  Administered 2022-02-23: 100 mg via ORAL
  Filled 2022-02-23: qty 2

## 2022-02-23 MED ORDER — GABAPENTIN 100 MG PO CAPS
100.0000 mg | ORAL_CAPSULE | Freq: Three times a day (TID) | ORAL | Status: DC
Start: 2022-02-23 — End: 2022-03-04
  Administered 2022-02-23 – 2022-03-04 (×25): 100 mg via ORAL
  Filled 2022-02-23 (×25): qty 1

## 2022-02-23 NOTE — Assessment & Plan Note (Deleted)
Possibility of carpal tunnel cannot be ruled out. ?If the MRIs are negative would recommend outpatient work-up for carpal tunnel. ?

## 2022-02-23 NOTE — ED Provider Notes (Signed)
?[facility] ?Provider Note ? ? ?CSN: [id number] ?Arrival date & time: 02/23/22  0755 ? ?  ? ?History ? ?Chief Complaint  ?Patient presents with  ? Fall  ? ? ?Christie Meza is a 76 y.o. female. ? ?Patient is a 76 year old female with a history of hypertension, bipolar disease, hyperlipidemia, CKD who is presenting today with her daughter due to weakness and falls.  When speaking with the patient and her daughter history is somewhat vague but patient reports she has had weakness for several months however daughter reports within the last 2 to 3 weeks patient has suddenly had more acute weakness involving the left arm and left leg with frequent falls and difficulty ambulating.  In the last 1 week patient has not been able to get up and walk independently at all.  Patient reports she has not followed up with her doctor about this her daughter reports that she has been trying to make an appointment but the patient is stubborn and does not want to go.  Patient has become incontinent in the last week urinating on herself but denies any pain with urination.  She reports that her left arm just feels so weak and she is having difficulty even holding a cup and drinking.  She feels that both of her lower extremities are weak but the left is worse than the right.  She denies any speech difficulty, visual changes or facial droop.  Daughter denies any confusion or new medications.  She does report that often times they will tell her she has a urinary tract infection at her doctor's office but she is not in any antibiotics at this time.  They had called the doctor and there was an appointment made for 9 March but they were concerned as she fell again today.  Patient reports her grandson has been getting her out of bed and then she would use a rollator however today she could not even get to the bathroom and she fell back hitting her right shoulder and back on the ground.  She does not take  anticoagulation. ? ?The history is provided by the patient and a relative.  ?Fall ? ? ?  ? ?Home Medications ?Prior to Admission medications   ?Medication Sig Start Date End Date Taking? Authorizing Provider  ?cloNIDine (CATAPRES) 0.1 MG tablet Take 1 tablet (0.1 mg total) by mouth 2 (two) times daily. 01/06/15   Charlcie Cradle, MD  ?DULoxetine (CYMBALTA) 20 MG capsule Take 1 capsule (20 mg total) by mouth daily. 01/06/15   Charlcie Cradle, MD  ?ibuprofen (ADVIL,MOTRIN) 200 MG tablet Take 600 mg by mouth every 6 (six) hours as needed (For pain.).    [provider]  ?lisinopril (PRINIVIL,ZESTRIL) 5 MG tablet Take 1 tablet (5 mg total) by mouth daily. 12/05/14   Niel Hummer, NP  ?Multiple Vitamin (MULTIVITAMIN WITH MINERALS) TABS tablet Take 1 tablet by mouth daily. 12/05/14   Niel Hummer, NP  ?QUEtiapine (SEROQUEL) 25 MG tablet Take 1 tablet (25 mg total) by mouth at bedtime. 01/06/15   Charlcie Cradle, MD  ?   ? ?Allergies    ?Aspirin   ? ?Review of Systems   ?Review of Systems ? ?Physical Exam ?Updated Vital Signs ?BP (!) 149/88 (BP Location: Right Arm)   Pulse 67   Temp 98.4 ?F (36.9 ?C) (Oral)   Resp 16   Ht 5\' 5"  (1.651 m)   Wt 113.4 kg   SpO2 100%  BMI 41.60 kg/m?  ?Physical Exam ?Vitals and nursing note reviewed.  ?Constitutional:   ?   General: She is not in acute distress. ?   Appearance: Normal appearance. She is well-developed.  ?HENT:  ?   Head: Normocephalic and atraumatic.  ?   Mouth/Throat:  ?   Mouth: Mucous membranes are moist.  ?Eyes:  ?   Pupils: Pupils are equal, round, and reactive to light.  ?Cardiovascular:  ?   Rate and Rhythm: Normal rate and regular rhythm.  ?   Heart sounds: Normal heart sounds. No murmur heard. ?  No friction rub.  ?Pulmonary:  ?   Effort: Pulmonary effort is normal.  ?   Breath sounds: Normal breath sounds. No wheezing or rales.  ?Abdominal:  ?   General: Bowel sounds are normal. There is no distension.  ?   Palpations: Abdomen is soft.  ?    Tenderness: There is no abdominal tenderness. There is no guarding or rebound.  ?Musculoskeletal:     ?   General: Tenderness present. Normal range of motion.  ?     Arms: ? ?   Cervical back: Normal range of motion and neck supple. Tenderness present.  ?   Comments: No edema  ?Skin: ?   General: Skin is warm and dry.  ?   Findings: No rash.  ?Neurological:  ?   Mental Status: She is alert and oriented to person, place, and time.  ?   Cranial Nerves: No cranial nerve deficit.  ?   Comments: Patient is unable to stand without a 2 patient assist and then almost falls as she can bear very little weight on her legs.  Patient has 5 of 5 strength in the right upper extremity and 4 out of 5 strength in the left upper extremity with noted pronator drift.  Patient can only lift the left leg off the bed a centimeter.  It quickly drops back down to the bed.  Right leg she has difficulty holding out but can bend it and lifted off the bed.  She also reports feeling numbness in her lower extremities however this appears to be more subjective as she can recognize my touch in both upper and lower extremities  ?Psychiatric:     ?   Mood and Affect: Mood normal.     ?   Behavior: Behavior normal.  ? ? ?ED Results / Procedures / Treatments   ?Labs ?(all labs ordered are listed, but only abnormal results are displayed) ?Labs Reviewed  ?BASIC METABOLIC PANEL - Abnormal; Notable for the following components:  ?    Result Value  ? Potassium 3.4 (*)   ? Glucose, Bld 126 (*)   ? Creatinine, Ser 1.34 (*)   ? Calcium 10.6 (*)   ? GFR, Estimated 42 (*)   ? All other components within normal limits  ?CBC WITH DIFFERENTIAL/PLATELET - Abnormal; Notable for the following components:  ? Hemoglobin 11.7 (*)   ? HCT 35.8 (*)   ? RDW 16.0 (*)   ? All other components within normal limits  ?ETHANOL  ?PROTIME-INR  ?APTT  ?RAPID URINE DRUG SCREEN, HOSP PERFORMED  ?URINALYSIS, ROUTINE W REFLEX MICROSCOPIC  ? ? ?EKG ?None ? ?Radiology ?No results  found. ? ?Procedures ?Procedures  ? ? ?Medications Ordered in ED ?Medications - No data to display ? ?ED Course/ Medical Decision Making/ A&P ?  ?                        ?  Medical Decision Making ?Amount and/or Complexity of Data Reviewed ?External Data Reviewed: notes. ?Labs: ordered. Decision-making details documented in ED Course. ?Radiology: ordered. ? ? ?Pt with multiple medical problems and comorbidities and presenting today with a complaint that caries a high risk for morbidity and mortality.  Patient's symptoms today are concerning for subacute stroke versus spinal lesion versus possible electrolyte abnormality versus possible UTI.  Patient is having global weakness but definitely more defined on the left.  She has had significant decline in mobility in the last 2 to 3 weeks.  3 weeks ago patient's daughter reports she was walking around [place] independently or with a cane and now she cannot even get out of bed and get to the bathroom.  She denies any systemic symptoms other than pain.  She has had multiple falls in the last few weeks.  Patient's vital signs show mild hypertension but otherwise within normal limits. ?I independently interpreted patient's labs and EKG.  CBC without acute findings, normal white count and hemoglobin of 11.7, BMP with chronic kidney disease with creatinine of 1.34 without recent to compare potassium of 3.4 but no other acute findings.  Coags, urine are still pending.  Chest x-ray and head and cervical spine CTs are pending. ? ? ? ? ? ? ? ? ?Final Clinical Impression(s) / ED Diagnoses ?Final diagnoses:  ?None  ? ? ?Rx / DC Orders ?ED Discharge Orders   ? ? None  ? ?  ? ? ?  ?Blanchie Dessert, MD ?02/23/22 1453 ? ?

## 2022-02-23 NOTE — Assessment & Plan Note (Addendum)
Body mass index is 41.93 kg/m?Marland Kitchen  ?Placing the patient at high risk for poor outcome. ?Possibility of sleep apnea/obesity hypoventilation syndrome cannot be ruled out. ?Monitor for now. ?

## 2022-02-23 NOTE — Assessment & Plan Note (Addendum)
Presents with complaints of left-sided weakness and bilateral hand numbness as well as recurrent fall. ?CT head unremarkable. 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 ?MRI brain negative for any acute stroke. 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 ?Neurosurgery consulted. ?Patient underwent anterior cervical decompression and C4-C5 arthrodesis and plate fixation.  On 5/4. ?Symptoms now improving.   ?PT OT recommends CIR ?Taper of the steroids.Marland Kitchen ?

## 2022-02-23 NOTE — ED Triage Notes (Signed)
Pt arrives via [facility] for a fall. Pt reports paresthesias in both legs ongoing for 2-3 weeks that caused her to fall in her bathroom. Denies head injury/LOC. No blood thinners. Pt c/o R knee and L shoulder, elbow, wrist, hip pain.  ?

## 2022-02-23 NOTE — ED Notes (Signed)
Patient returned from MRI.

## 2022-02-23 NOTE — H&P (Signed)
?History and Physical  ? ? ?Patient: Christie Meza [id number] DOB: [birth year] ?DOA: 02/23/2022 ?DOS: the patient was seen and examined on 02/23/2022 ?PCP: Nolene Ebbs, MD  ?Patient coming from: Home ? ?Chief Complaint:  ?Chief Complaint  ?Patient presents with  ? Fall  ? ?HPI: Christie Meza is a 76 y.o. female with medical history significant of hypertension, morbid obesity, bipolar disorder. ?Presents to complaints of weakness and pain involving her left leg. ?Patient reports that she started having fall somewhere in February/March 2023. ?And continues to have frequent falls after that.  This morning when she woke up and was trying to go to the bathroom using her walker, she was unable to support her self and had a fall in the bathroom.  Denies having any head or neck injury.  But reports pain in the left leg worsening since the fall. ?Reportedly patient has been having this pain in her left leg involving hip and knee for last 3 weeks progressively worsening and limiting her mobility. ?Now she started to notice pain in her right knee as well. ?She also reports that she has numbness of both upper extremities specifically involving her hands ongoing for last couple of months. ?But the left hand is now also more weaker. ?Denies any headache denies any dizziness or vertigo denies any blurry vision.  Denies any confusion.  Denies any chest pain or abdominal pain.  No fever no chills. ?Reports urinary frequency and loss of control of bladder function. ?Tells me that she has numbness of her buttocks but denies any loss of control of bowel function. ?No blood in the urine. ?Tells me that she is compliant with all her medication and there is no recent new medications. ?Intermittently reports burning urination. ?Review of Systems: As mentioned in the history of present illness. All other systems reviewed and are negative. ?Past Medical History:  ?Diagnosis Date  ? Bipolar 1 disorder ([place])   ? Hypertension   ? Obesity    ? ?Past Surgical History:  ?Procedure Laterality Date  ? ABDOMINAL HYSTERECTOMY  ~2002  ? ?Social History:  reports that she has never smoked. She has never used smokeless tobacco. She reports current alcohol use. She reports current drug use. ? ?Allergies  ?Allergen Reactions  ? Aspirin Hives  ? ? ?Family History  ?Problem Relation Age of Onset  ? Kidney disease Mother   ? Hypertension Mother   ? Kidney disease Brother   ? ? ?Prior to Admission medications   ?Medication Sig Start Date End Date Taking? Authorizing Provider  ?acetaminophen (TYLENOL) 500 MG tablet Take 500 mg by mouth every 6 (six) hours as needed for moderate pain.   Yes [provider]  ?aspirin EC 81 MG tablet Take 81 mg by mouth daily. Swallow whole.   Yes [provider]  ?atenolol (TENORMIN) 100 MG tablet Take 100 mg by mouth daily. 12/28/21  Yes [provider]  ?Chlorpheniramine-Phenylephrine (SINUS & ALLERGY PO) Take 1 tablet by mouth daily as needed (congestion).   Yes [provider]  ?cloNIDine (CATAPRES) 0.1 MG tablet Take 1 tablet (0.1 mg total) by mouth 2 (two) times daily. 01/06/15  Yes Charlcie Cradle, MD  ?DULoxetine (CYMBALTA) 30 MG capsule Take 30 mg by mouth daily. 12/28/21  Yes [provider]  ?gabapentin (NEURONTIN) 100 MG capsule Take 100 mg by mouth 3 (three) times daily. 12/17/21  Yes [provider]  ?hydrALAZINE (APRESOLINE) 100 MG tablet Take 100 mg by mouth 2 (two) times daily. 12/28/21  Yes [provider]  ?omeprazole (PRILOSEC) 20 MG capsule Take 20 mg by mouth daily. 11/25/21  Yes [provider]  ?pravastatin (PRAVACHOL) 40 MG tablet Take 40 mg by mouth daily. 12/28/21  Yes [provider]  ?QUEtiapine (SEROQUEL) 50 MG tablet Take 50 mg by mouth at bedtime. 02/10/22  Yes [provider]  ?traZODone (DESYREL) 100 MG tablet Take 100 mg by mouth at bedtime. 01/11/22  Yes [provider]  ? ? ?Physical Exam: ?Vitals:  ? 02/23/22 1515  02/23/22 1530 02/23/22 1600 02/23/22 1630  ?BP: (!) 122/94 137/85 (!) 162/78 (!) 168/99  ?Pulse: (!) 59 (!) 59 63 63  ?Resp:   17 12  ?Temp:      ?TempSrc:      ?SpO2: 100% 100% 100% 99%  ?Weight:      ?Height:      ? ?General: Appear in mild distress, no Rash; Oral Mucosa Clear, moist. no Abnormal Neck Mass Or lumps, Conjunctiva normal  ?Cardiovascular: S1 and S2 Present, no Murmur, ?Respiratory: good respiratory effort, Bilateral Air entry present and CTA, no Crackles, no wheezes ?Abdomen: Bowel Sound present, Soft and no tenderness ?Extremities: Trace pedal edema, no evidence of redness or warmth.  Calf tenderness present on left. ?Neurology: alert and oriented to time, place, and person pupils are equal and reactive.  Extraocular muscle movements intact.  No nystagmus. ?affect appropriate.  Left upper extremity weakness as well as pronator drift, left finger-nose-finger abnormal.   ?Equal strength bilateral lower extremity 4 out of 5. ?Sensation equal and intact to both upper extremities. ?Mild loss of thenar eminence bilaterally. ?Gait not checked due to patient safety concerns   ? ?Data Reviewed: ?I have Reviewed nursing notes, Vitals, and Lab results since pt's last encounter. Pertinent lab results CBC and CMP, urinalysis ?I have ordered test including CBC and BMP ?I have ordered imaging studies MRI brain, MRI C-spine, MRI L-spine. ?I have independently visualized and interpreted EKG which showed EKG: normal sinus rhythm, nonspecific ST and T waves changes. ?I have discussed pt's care plan and test results with ED provider.  ? ?Assessment and Plan: ?* Acute left-sided weakness ?Presents with complaints of left-sided weakness and bilateral hand numbness as well as recurrent fall. ?CT head unremarkable. ?Given that her symptoms have been ongoing for more than 3 weeks stroke is less likely. 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 ?We will check MRI C-spine and L-spine ?Also check MRI  brain. ?PT OT and speech evaluation. ? ?Bilateral hand numbness ?Possibility of carpal tunnel cannot be ruled out. ?If the MRIs are negative would recommend outpatient work-up for carpal tunnel. ? ?Recurrent falls ?And weakness.  Without any syncopal events.  Without any head or neck injury reported by the patient's as well. ?PT OT consulted. ? ?Bilateral knee pain ?Worsening after fall. ?We will check lower extremity Doppler. ?Check x-ray on the left as well. ?Also check hip x-ray on left. ? ?AKI (acute kidney injury) ([place]) ?No recent baseline serum creatinine available in our system.  Currently serum creatinine 1.3.  In the past serum creatinine was less than 1. ?We will provide IV hydration and monitor. ?Work-up also reveals hypercalcemia as well as hypokalemia. ?We will replace potassium level. ?If the calcium level does not improve with hydration may require further work-up. ? ?Urinary incontinence ?UTI. ?Ongoing urinary incontinence for last few weeks. ?This is associated with pain related immobility. ?Urine positive for nitrites therefore we will go ahead and treat as UTI with IV  ceftriaxone for now. ? ?Hypertension ?Blood pressure elevated. ?Patient has missed her home medications. ?Continue home regimen for now. ? ?Obesity, Class III, BMI 40-49.9 (morbid obesity) ([place]) ?Body mass index is 41.6 kg/m?Marland Kitchen  ?Placing the patient at high risk for poor outcome. ?Possibility of sleep apnea/obesity hypoventilation syndrome cannot be ruled out. ?Monitor for now. ? ?Advance Care Planning:   Code Status: Full Code discussed with daughter.  Daughter to have power of attorney documents completed. ? ?Consults: none ? ?Family Communication: Daughter at bedside ? ?Severity of Illness: ?The appropriate patient status for this patient is OBSERVATION. Observation status is judged to be reasonable and necessary in order to provide the required intensity of service to ensure the patient's safety. The patient's presenting symptoms,  physical exam findings, and initial radiographic and laboratory data in the context of their medical condition is felt to place them at decreased risk for further clinical deterioration. Furthermore, it i

## 2022-02-23 NOTE — Assessment & Plan Note (Addendum)
Worsening after fall. ?Lower extremity Doppler is negative. ?X-ray of the knee shows arthritis. ?Hip x-ray showed some concern for possible femur fracture although CT pelvis ruled out presence of any fracture.  Anticipate improvement in pain with mobility. ?

## 2022-02-23 NOTE — Assessment & Plan Note (Addendum)
No recent baseline serum creatinine available in our system.  Currently serum creatinine 1.3.  In the past serum creatinine was less than 1. ?Work-up also reveals hypercalcemia as well as hypokalemia. ?Treated with IV fluids. ?

## 2022-02-23 NOTE — Assessment & Plan Note (Deleted)
And weakness.  Without any syncopal events.  Without any head or neck injury reported by the patient's as well. ?PT OT consulted.  Recommend CIR. ?

## 2022-02-23 NOTE — Assessment & Plan Note (Addendum)
Blood pressure was elevated on admission from missing home medication. ?Has sinus bradycardia therefore beta-blocker is currently on hold. ?Continue clonidine. ?

## 2022-02-23 NOTE — ED Notes (Signed)
Patient transported to X-ray 

## 2022-02-23 NOTE — ED Provider Notes (Addendum)
?  Physical Exam  ?BP (!) 162/78   Pulse 63   Temp 98.4 ?F (36.9 ?C) (Oral)   Resp 17   Ht 5\' 5"  (1.651 m)   Wt 113.4 kg   SpO2 100%   BMI 41.60 kg/m?  ? ?Physical Exam ? ?Procedures  ?Procedures ? ?ED Course / MDM  ?  ?Medical Decision Making ?Amount and/or Complexity of Data Reviewed ?Labs: ordered. ?Radiology: ordered. ? ?Risk ?Decision regarding hospitalization. ? ? ?Patient presented with weakness and falls.  Has had some urinary incontinence.  Unsure if it was difficulty urinating to cause incontinence or just weakness would not allow her to get up and go to the bathroom.  Urinalysis still pending at this time.  However does have some lateralizing weakness.  Worse on left side but also reportedly somewhat weak in both lower extremities.  Also states tingling or decree sensation but with her upper extremities.  Head CT reassuring.  Cervical spine CT showed some mild disease.  However cannot ambulate at this time.  Lab work reassuring.  Mild hypokalemia.  Will admit to hospitalist for further evaluation. ? ? ? ? ?  ? , MD ?02/23/22 1645 ? ?Informed of MRI results showing cord compression.  Informed on-call hospitalist. ? ?  ?04/25/22, MD ?02/23/22 2228 ? ?

## 2022-02-23 NOTE — Assessment & Plan Note (Addendum)
UTI. ?Ongoing urinary incontinence for last few weeks. ?This is associated with pain related immobility. ?Urine positive for nitrites therefore we will go ahead and treat as UTI with IV ceftriaxone for now.  Last day 5/7. ?

## 2022-02-23 NOTE — ED Notes (Signed)
Patient transported to MRI 

## 2022-02-24 ENCOUNTER — Inpatient Hospital Stay (HOSPITAL_COMMUNITY): Payer: Medicare Other

## 2022-02-24 ENCOUNTER — Encounter (HOSPITAL_COMMUNITY)
Admission: EM | Disposition: A | Discharge status: Skilled Nursing Facility | Payer: Self-pay | Source: Home / Self Care | Attending: Internal Medicine

## 2022-02-24 ENCOUNTER — Observation Stay (HOSPITAL_COMMUNITY): Payer: Medicare Other

## 2022-02-24 ENCOUNTER — Encounter (HOSPITAL_COMMUNITY): Payer: Self-pay | Admitting: Internal Medicine

## 2022-02-24 ENCOUNTER — Inpatient Hospital Stay (HOSPITAL_COMMUNITY): Payer: Medicare Other | Admitting: Critical Care Medicine

## 2022-02-24 DIAGNOSIS — R531 Weakness: Secondary | ICD-10-CM | POA: Diagnosis present

## 2022-02-24 DIAGNOSIS — Z90710 Acquired absence of both cervix and uterus: Secondary | ICD-10-CM | POA: Diagnosis not present

## 2022-02-24 DIAGNOSIS — M4802 Spinal stenosis, cervical region: Secondary | ICD-10-CM

## 2022-02-24 DIAGNOSIS — Z79899 Other long term (current) drug therapy: Secondary | ICD-10-CM | POA: Diagnosis not present

## 2022-02-24 DIAGNOSIS — M170 Bilateral primary osteoarthritis of knee: Secondary | ICD-10-CM | POA: Diagnosis present

## 2022-02-24 DIAGNOSIS — Z841 Family history of disorders of kidney and ureter: Secondary | ICD-10-CM | POA: Diagnosis not present

## 2022-02-24 DIAGNOSIS — I472 Ventricular tachycardia, unspecified: Secondary | ICD-10-CM | POA: Diagnosis not present

## 2022-02-24 DIAGNOSIS — K5900 Constipation, unspecified: Secondary | ICD-10-CM | POA: Diagnosis not present

## 2022-02-24 DIAGNOSIS — M4312 Spondylolisthesis, cervical region: Secondary | ICD-10-CM

## 2022-02-24 DIAGNOSIS — F319 Bipolar disorder, unspecified: Secondary | ICD-10-CM | POA: Diagnosis present

## 2022-02-24 DIAGNOSIS — I10 Essential (primary) hypertension: Secondary | ICD-10-CM | POA: Diagnosis present

## 2022-02-24 DIAGNOSIS — M5001 Cervical disc disorder with myelopathy,  high cervical region: Secondary | ICD-10-CM | POA: Diagnosis present

## 2022-02-24 DIAGNOSIS — M50021 Cervical disc disorder at C4-C5 level with myelopathy: Secondary | ICD-10-CM | POA: Diagnosis present

## 2022-02-24 DIAGNOSIS — E538 Deficiency of other specified B group vitamins: Secondary | ICD-10-CM | POA: Diagnosis present

## 2022-02-24 DIAGNOSIS — M4712 Other spondylosis with myelopathy, cervical region: Secondary | ICD-10-CM | POA: Diagnosis not present

## 2022-02-24 DIAGNOSIS — Z886 Allergy status to analgesic agent status: Secondary | ICD-10-CM | POA: Diagnosis not present

## 2022-02-24 DIAGNOSIS — R296 Repeated falls: Secondary | ICD-10-CM | POA: Diagnosis present

## 2022-02-24 DIAGNOSIS — Z6841 Body Mass Index (BMI) 40.0 and over, adult: Secondary | ICD-10-CM | POA: Diagnosis not present

## 2022-02-24 DIAGNOSIS — N179 Acute kidney failure, unspecified: Secondary | ICD-10-CM | POA: Diagnosis present

## 2022-02-24 DIAGNOSIS — Y92002 Bathroom of unspecified non-institutional (private) residence single-family (private) house as the place of occurrence of the external cause: Secondary | ICD-10-CM | POA: Diagnosis not present

## 2022-02-24 DIAGNOSIS — N390 Urinary tract infection, site not specified: Secondary | ICD-10-CM | POA: Diagnosis present

## 2022-02-24 DIAGNOSIS — Z8249 Family history of ischemic heart disease and other diseases of the circulatory system: Secondary | ICD-10-CM | POA: Diagnosis not present

## 2022-02-24 DIAGNOSIS — W1830XA Fall on same level, unspecified, initial encounter: Secondary | ICD-10-CM | POA: Diagnosis present

## 2022-02-24 DIAGNOSIS — M5106 Intervertebral disc disorders with myelopathy, lumbar region: Secondary | ICD-10-CM

## 2022-02-24 DIAGNOSIS — Z7982 Long term (current) use of aspirin: Secondary | ICD-10-CM | POA: Diagnosis not present

## 2022-02-24 DIAGNOSIS — R609 Edema, unspecified: Secondary | ICD-10-CM | POA: Diagnosis not present

## 2022-02-24 DIAGNOSIS — E662 Morbid (severe) obesity with alveolar hypoventilation: Secondary | ICD-10-CM | POA: Diagnosis present

## 2022-02-24 DIAGNOSIS — D696 Thrombocytopenia, unspecified: Secondary | ICD-10-CM | POA: Diagnosis not present

## 2022-02-24 DIAGNOSIS — E876 Hypokalemia: Secondary | ICD-10-CM | POA: Diagnosis present

## 2022-02-24 HISTORY — PX: ANTERIOR CERVICAL DECOMP/DISCECTOMY FUSION: SHX1161

## 2022-02-24 LAB — CBC
HCT: 38.1 % (ref 36.0–46.0)
Hemoglobin: 11.5 g/dL — ABNORMAL LOW (ref 12.0–15.0)
MCH: 26.6 pg (ref 26.0–34.0)
MCHC: 30.2 g/dL (ref 30.0–36.0)
MCV: 88.2 fL (ref 80.0–100.0)
Platelets: 134 10*3/uL — ABNORMAL LOW (ref 150–400)
RBC: 4.32 MIL/uL (ref 3.87–5.11)
RDW: 16.5 % — ABNORMAL HIGH (ref 11.5–15.5)
WBC: 6.7 10*3/uL (ref 4.0–10.5)
nRBC: 0 % (ref 0.0–0.2)

## 2022-02-24 LAB — BASIC METABOLIC PANEL WITH GFR
Anion gap: 9 (ref 5–15)
BUN: 16 mg/dL (ref 8–23)
CO2: 20 mmol/L — ABNORMAL LOW (ref 22–32)
Calcium: 10.9 mg/dL — ABNORMAL HIGH (ref 8.9–10.3)
Chloride: 114 mmol/L — ABNORMAL HIGH (ref 98–111)
Creatinine, Ser: 1.07 mg/dL — ABNORMAL HIGH (ref 0.44–1.00)
GFR, Estimated: 55 mL/min — ABNORMAL LOW
Glucose, Bld: 89 mg/dL (ref 70–99)
Potassium: 4.2 mmol/L (ref 3.5–5.1)
Sodium: 143 mmol/L (ref 135–145)

## 2022-02-24 LAB — T4, FREE: Free T4: 1.04 ng/dL (ref 0.61–1.12)

## 2022-02-24 LAB — SURGICAL PCR SCREEN
MRSA, PCR: NEGATIVE
Staphylococcus aureus: NEGATIVE

## 2022-02-24 LAB — TSH: TSH: 0.835 u[IU]/mL (ref 0.350–4.500)

## 2022-02-24 LAB — LIPID PANEL
Cholesterol: 151 mg/dL (ref 0–200)
HDL: 42 mg/dL (ref >40)
LDL Cholesterol: 89 mg/dL (ref 0–99)
Total CHOL/HDL Ratio: 3.6 RATIO
Triglycerides: 102 mg/dL (ref <150)
VLDL: 20 mg/dL (ref 0–40)

## 2022-02-24 LAB — ABO/RH: ABO/RH(D): A POS

## 2022-02-24 LAB — TYPE AND SCREEN
ABO/RH(D): A POS
Antibody Screen: NEGATIVE

## 2022-02-24 LAB — VITAMIN B12: Vitamin B-12: 237 pg/mL (ref 180–914)

## 2022-02-24 IMAGING — CR DG CERVICAL SPINE 2 OR 3 VIEWS
3 series · 3 of 3 positions shown · non-contrast
Comparison: Cervical spine MRI [DATE]. Cervical spine CT
[DATE].

CLINICAL DATA: Provided history: Anterior cervical 3-4 and cervical
4 through 5 decompression/discectomy fusion 2 levels.

EXAM:
CERVICAL SPINE - 2-3 VIEW

[xtable (1 of 2)]
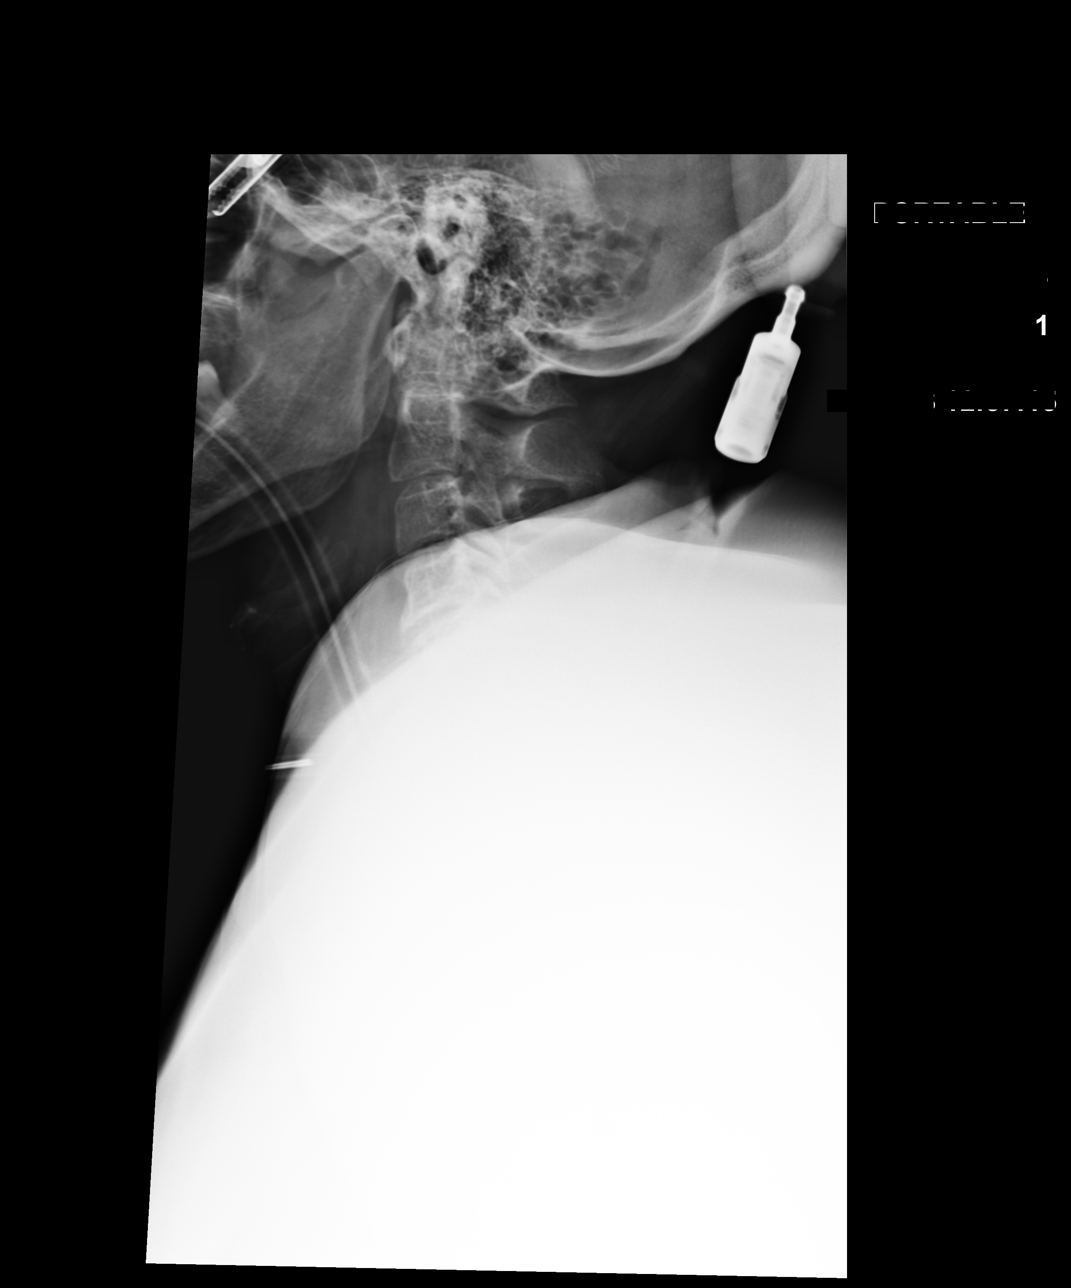

[xtable (2 of 2)]
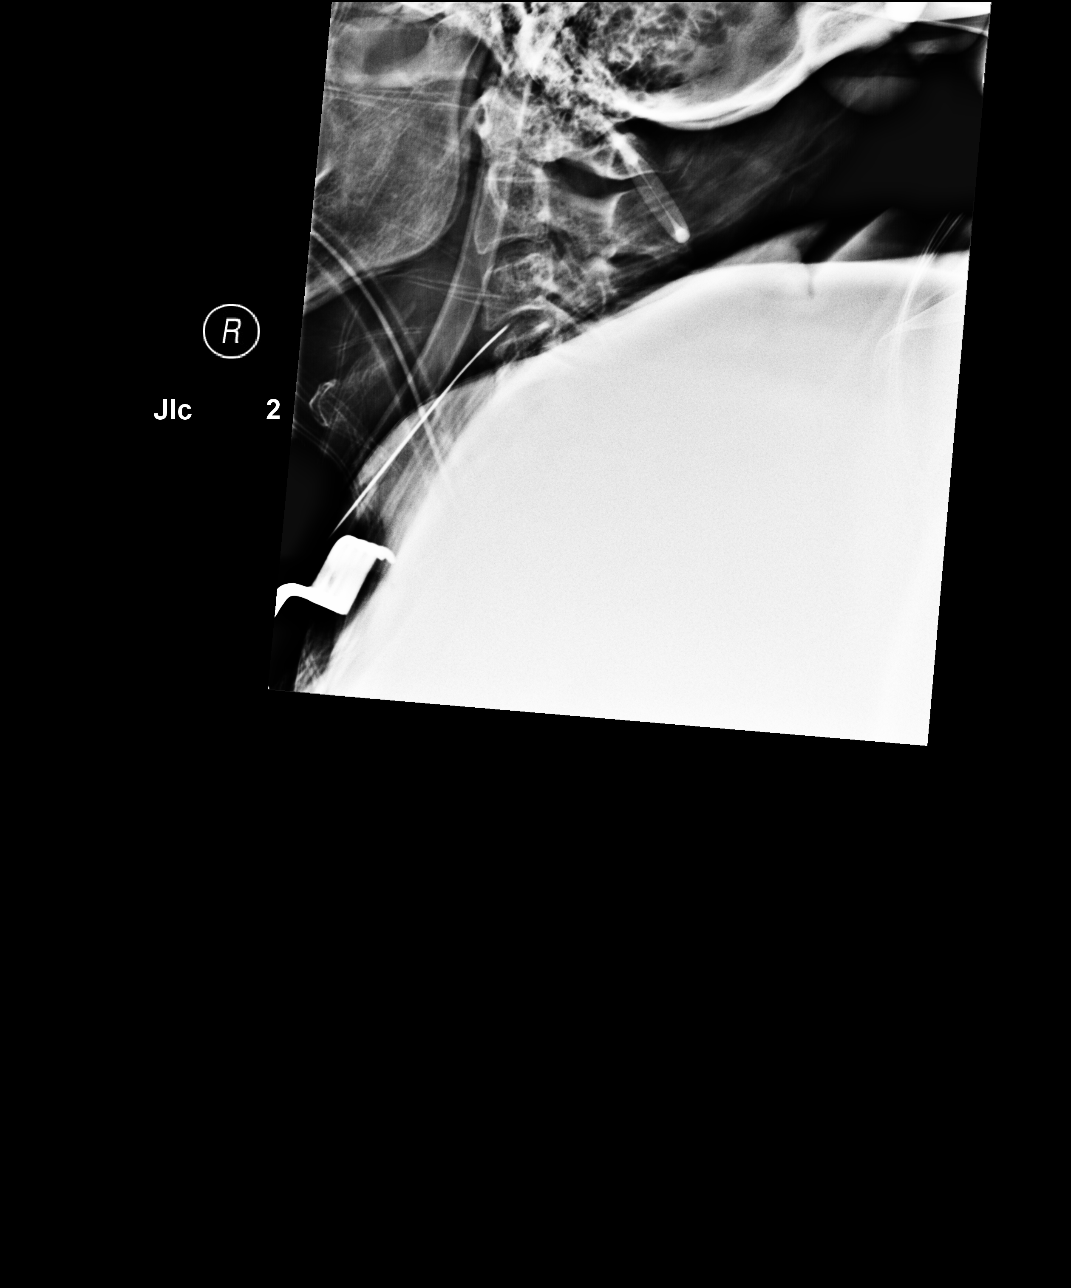

[AP]
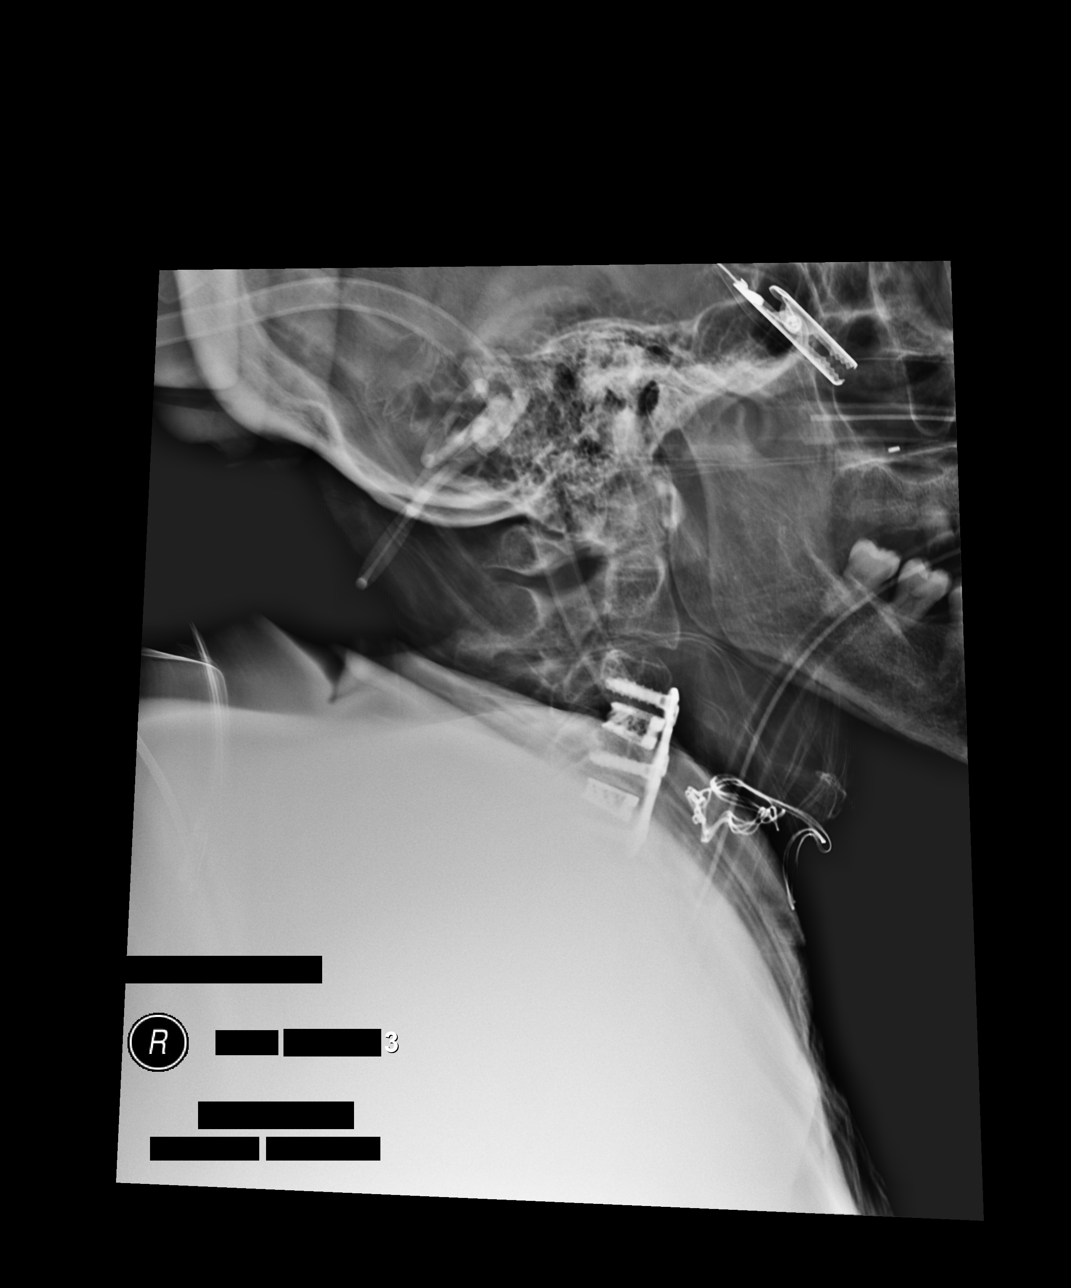

[3 of 3 positions shown; findings below may reference images not displayed]

FINDINGS: Three lateral view intraoperative radiographs of the cervical spine
are submitted. On the provided images, there are findings of
interval C3-C5 ACDF. On the most recent image acquired at [DATE] p.m.,
curvilinear densities likely reflecting surgical sponges/packing
material project ventral to the cervical spine at the operative
levels.
IMPRESSION: Three lateral view intraoperative radiographs of the cervical spine
from C3-C5 ACDF.

On the most recent image acquired at [DATE] p.m., curvilinear
densities likely reflecting surgical sponges/packing material
projects ventral to the cervical spine at the operative levels.
Correlate with the operative history.

## 2022-02-24 IMAGING — CT CT PELVIS W/O CM
2 of 3 series · 16 of 46 positions shown, 18 images · non-contrast
Comparison: Radiography from earlier today

CLINICAL DATA: Head trauma with fracture suspected

EXAM:
CT PELVIS WITHOUT CONTRAST
TECHNIQUE: Multidetector CT imaging of the pelvis was performed following the
standard protocol without intravenous contrast.
RADIATION DOSE REDUCTION: This exam was performed according to the
departmental dose-optimization program which includes automated
exposure control, adjustment of the mA and/or kV according to
patient size and/or use of iterative reconstruction technique.

[Series 5: pelvis 2.0 st · axial · 0.98mm/px · z∈[+813,+1073]mm · 13 of 150 slices shown, 15 images]
[im 10/150  soft-tissue]
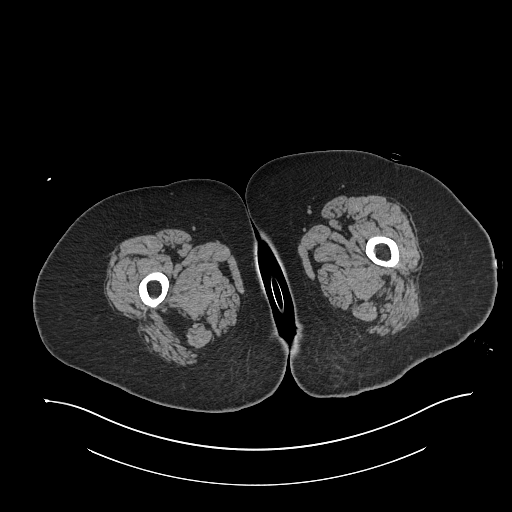
[im 10/150  bone]
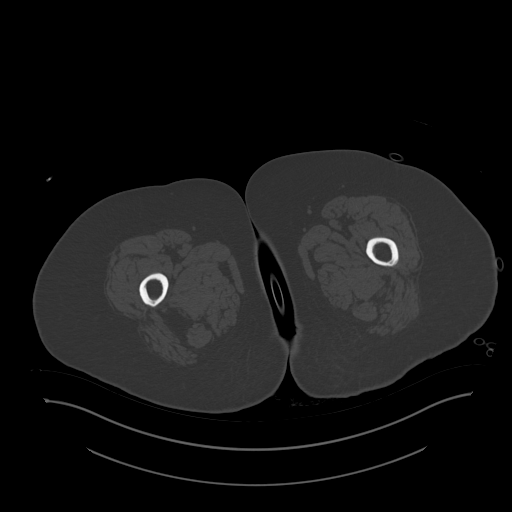
[im 20/150  soft-tissue]
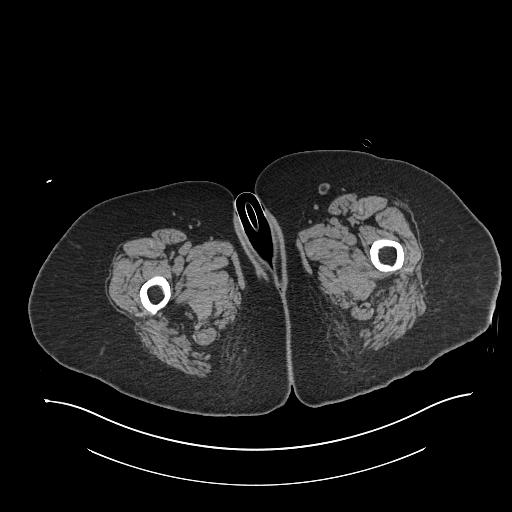
[im 29/150  soft-tissue]
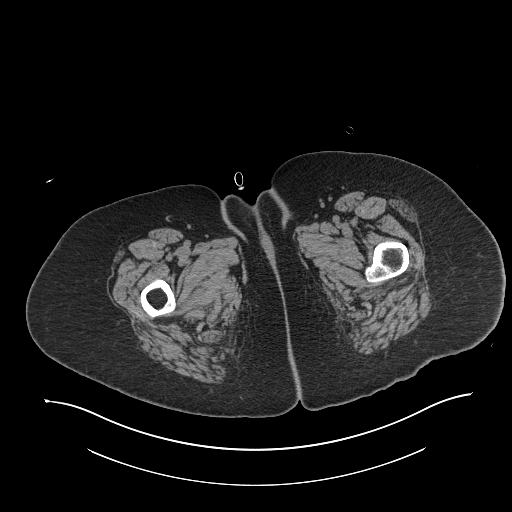
[im 44/150  soft-tissue]
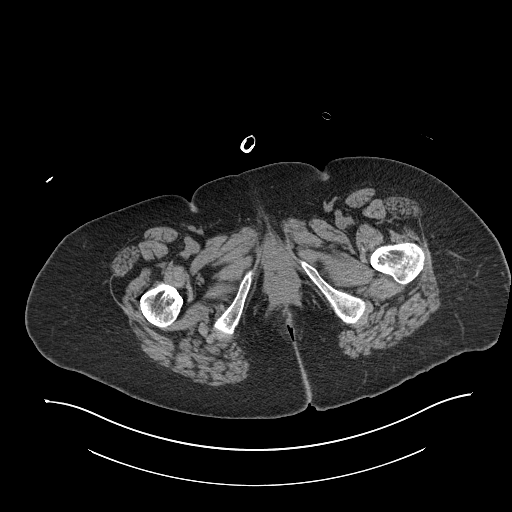
[im 53/150  soft-tissue]
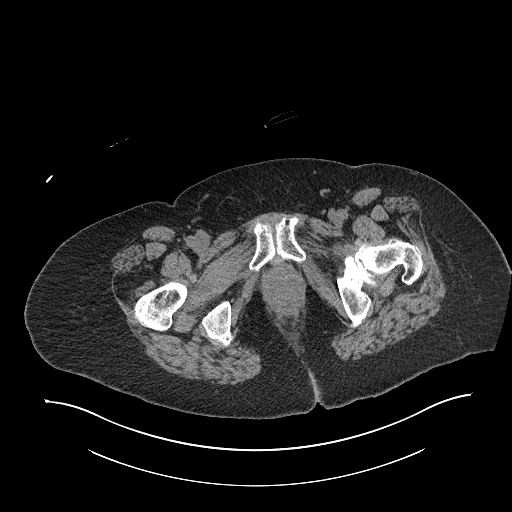
[im 63/150  soft-tissue]
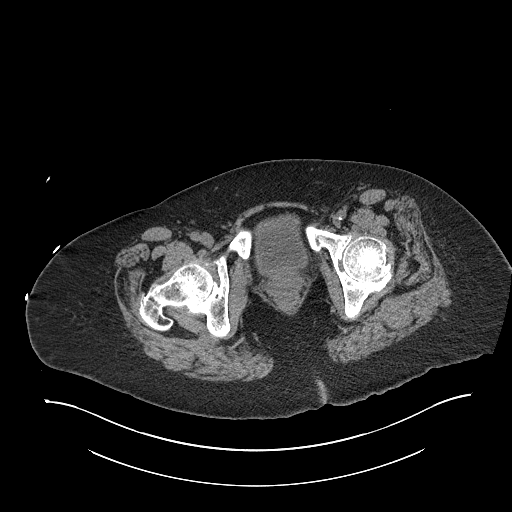
[im 77/150  soft-tissue]
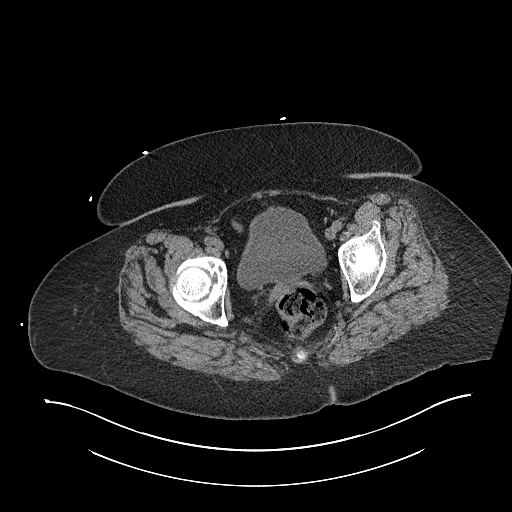
[im 87/150  soft-tissue]
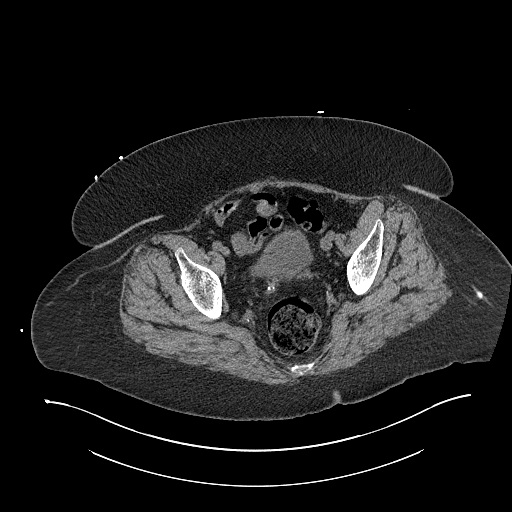
[im 97/150  soft-tissue]
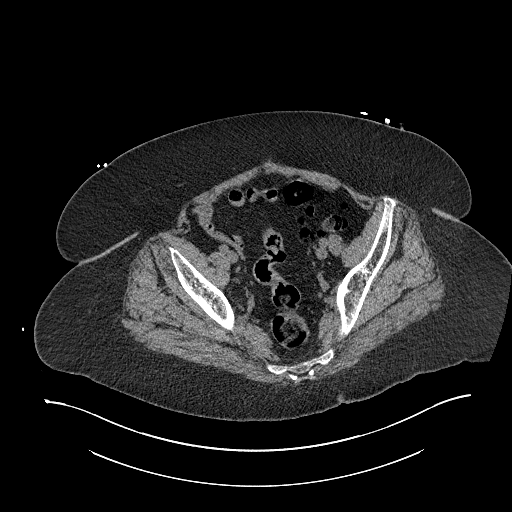
[im 97/150  bone]
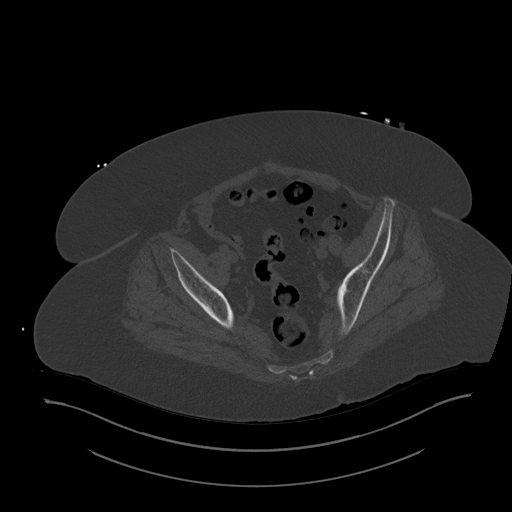
[im 106/150  soft-tissue]
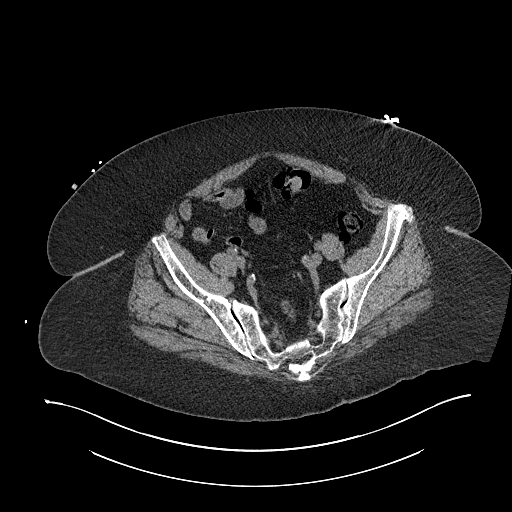
[im 121/150  soft-tissue]
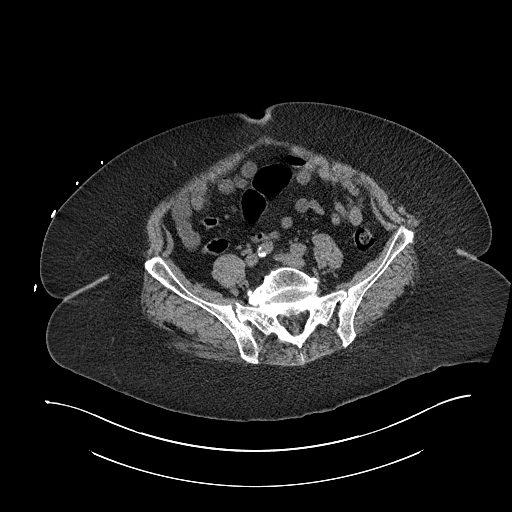
[im 130/150  soft-tissue]
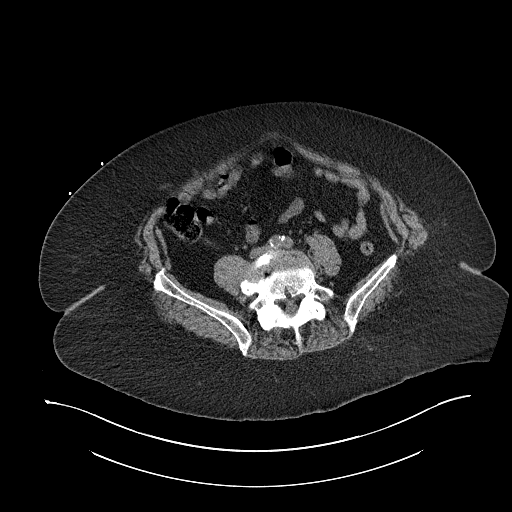
[im 140/150  soft-tissue]
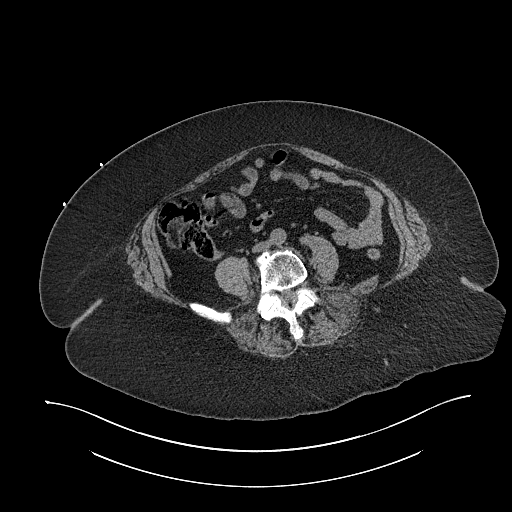

[Series 6: pelvis 2.0 cor. bone · coronal · 0.59mm/px · 3 of 175 slices shown]
[im 59/175  soft-tissue]
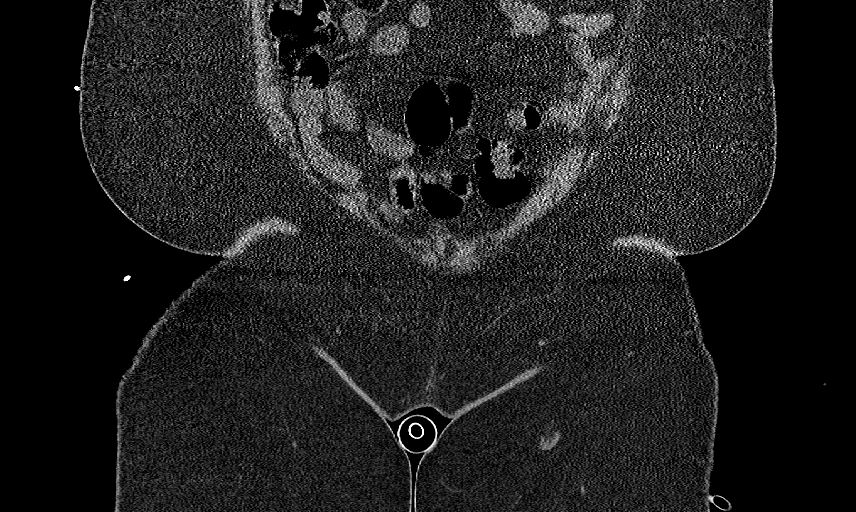
[im 78/175  soft-tissue]
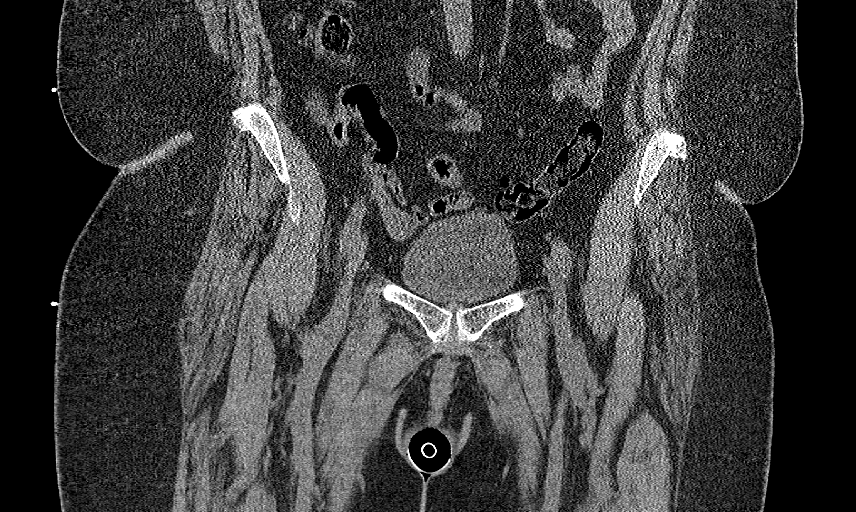
[im 97/175  soft-tissue]
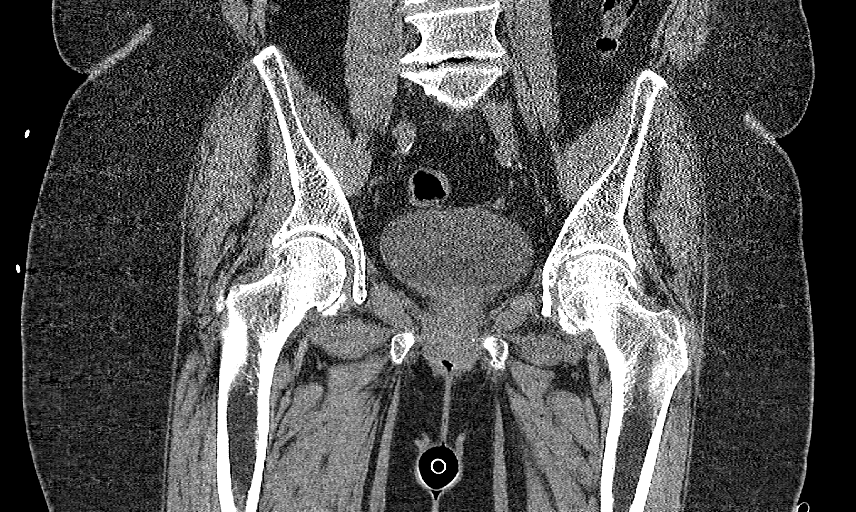

[16 of 46 positions shown; findings below may reference images not displayed]

FINDINGS: No visible hip fracture and no hip dislocation. Radiographic
findings likely related to degenerative hip spurring with ring of
osteophytes greater on the left. Lumbar spine degeneration as
evaluated by recent MRI. Negative for hip joint effusion or acute
erosion. No acute soft tissue findings.
IMPRESSION: No acute finding. Radiographic appearance likely related to
degenerative hip spurring on the left more than right.

## 2022-02-24 SURGERY — ANTERIOR CERVICAL DECOMPRESSION/DISCECTOMY FUSION 2 LEVELS
Anesthesia: General

## 2022-02-24 MED ORDER — MORPHINE SULFATE (PF) 2 MG/ML IV SOLN: 2.0000 mg | INTRAVENOUS | Status: DC | PRN

## 2022-02-24 MED ORDER — ORAL CARE MOUTH RINSE: 15.0000 mL | Freq: Once | OROMUCOSAL | Status: AC

## 2022-02-24 MED ORDER — ATENOLOL 25 MG PO TABS
25.0000 mg | ORAL_TABLET | Freq: Every day | ORAL | Status: DC
  Administered 2022-02-24 – 2022-02-28 (×5): 25 mg via ORAL
  Filled 2022-02-24 (×6): qty 1

## 2022-02-24 MED ORDER — DEXAMETHASONE SODIUM PHOSPHATE 10 MG/ML IJ SOLN
INTRAMUSCULAR | Status: AC
  Filled 2022-02-24: qty 1

## 2022-02-24 MED ORDER — THROMBIN 5000 UNITS EX SOLR
CUTANEOUS | Status: AC
  Filled 2022-02-24: qty 5000

## 2022-02-24 MED ORDER — LACTATED RINGERS IV SOLN: INTRAVENOUS | Status: DC

## 2022-02-24 MED ORDER — SODIUM CHLORIDE 0.9% FLUSH: 3.0000 mL | INTRAVENOUS | Status: DC | PRN

## 2022-02-24 MED ORDER — DEXAMETHASONE SODIUM PHOSPHATE 10 MG/ML IJ SOLN
10.0000 mg | Freq: Once | INTRAMUSCULAR | Status: AC
  Administered 2022-02-24: 10 mg via INTRAVENOUS
  Filled 2022-02-24: qty 1

## 2022-02-24 MED ORDER — LIDOCAINE 2% (20 MG/ML) 5 ML SYRINGE
INTRAMUSCULAR | Status: DC | PRN
  Administered 2022-02-24: 60 mg via INTRAVENOUS

## 2022-02-24 MED ORDER — DOCUSATE SODIUM 100 MG PO CAPS
100.0000 mg | ORAL_CAPSULE | Freq: Two times a day (BID) | ORAL | Status: DC
  Administered 2022-02-24 – 2022-03-03 (×14): 100 mg via ORAL
  Filled 2022-02-24 (×16): qty 1

## 2022-02-24 MED ORDER — FENTANYL CITRATE (PF) 250 MCG/5ML IJ SOLN
INTRAMUSCULAR | Status: DC | PRN
  Administered 2022-02-24 (×3): 50 ug via INTRAVENOUS
  Administered 2022-02-24 (×2): 25 ug via INTRAVENOUS
  Administered 2022-02-24: 50 ug via INTRAVENOUS

## 2022-02-24 MED ORDER — CEFAZOLIN SODIUM-DEXTROSE 2-3 GM-%(50ML) IV SOLR
INTRAVENOUS | Status: DC | PRN
  Administered 2022-02-24: 2 g via INTRAVENOUS

## 2022-02-24 MED ORDER — CHLORHEXIDINE GLUCONATE 0.12 % MT SOLN: 15.0000 mL | Freq: Once | OROMUCOSAL | Status: AC

## 2022-02-24 MED ORDER — SENNA 8.6 MG PO TABS
1.0000 | ORAL_TABLET | Freq: Two times a day (BID) | ORAL | Status: DC
  Administered 2022-02-24 – 2022-03-03 (×14): 8.6 mg via ORAL
  Filled 2022-02-24 (×15): qty 1

## 2022-02-24 MED ORDER — HYDROCODONE-ACETAMINOPHEN 5-325 MG PO TABS
1.0000 | ORAL_TABLET | ORAL | Status: DC | PRN
  Administered 2022-02-24 – 2022-03-03 (×5): 1 via ORAL
  Filled 2022-02-24 (×6): qty 1

## 2022-02-24 MED ORDER — SUCCINYLCHOLINE CHLORIDE 200 MG/10ML IV SOSY
PREFILLED_SYRINGE | INTRAVENOUS | Status: AC
  Filled 2022-02-24: qty 20

## 2022-02-24 MED ORDER — BUPIVACAINE HCL 0.5 % IJ SOLN
INTRAMUSCULAR | Status: DC | PRN
  Administered 2022-02-24: 10 mL

## 2022-02-24 MED ORDER — PHENYLEPHRINE 80 MCG/ML (10ML) SYRINGE FOR IV PUSH (FOR BLOOD PRESSURE SUPPORT)
PREFILLED_SYRINGE | INTRAVENOUS | Status: AC
  Filled 2022-02-24: qty 30

## 2022-02-24 MED ORDER — MENTHOL 3 MG MT LOZG
1.0000 | LOZENGE | OROMUCOSAL | Status: DC | PRN
  Filled 2022-02-24: qty 9

## 2022-02-24 MED ORDER — ROCURONIUM BROMIDE 10 MG/ML (PF) SYRINGE
PREFILLED_SYRINGE | INTRAVENOUS | Status: DC | PRN
  Administered 2022-02-24 (×2): 20 mg via INTRAVENOUS
  Administered 2022-02-24: 80 mg via INTRAVENOUS

## 2022-02-24 MED ORDER — CHLORHEXIDINE GLUCONATE 0.12 % MT SOLN
OROMUCOSAL | Status: AC
  Administered 2022-02-24: 15 mL via OROMUCOSAL
  Filled 2022-02-24: qty 15

## 2022-02-24 MED ORDER — OXYCODONE HCL 5 MG PO TABS: 5.0000 mg | ORAL_TABLET | Freq: Once | ORAL | Status: DC | PRN

## 2022-02-24 MED ORDER — ONDANSETRON HCL 4 MG PO TABS: 4.0000 mg | ORAL_TABLET | Freq: Four times a day (QID) | ORAL | Status: DC | PRN

## 2022-02-24 MED ORDER — OXYCODONE HCL 5 MG/5ML PO SOLN: 5.0000 mg | Freq: Once | ORAL | Status: DC | PRN

## 2022-02-24 MED ORDER — SODIUM CHLORIDE 0.9 % IV SOLN: 250.0000 mL | INTRAVENOUS | Status: DC

## 2022-02-24 MED ORDER — 0.9 % SODIUM CHLORIDE (POUR BTL) OPTIME
TOPICAL | Status: DC | PRN
  Administered 2022-02-24: 1000 mL

## 2022-02-24 MED ORDER — FENTANYL CITRATE (PF) 250 MCG/5ML IJ SOLN
INTRAMUSCULAR | Status: AC
  Filled 2022-02-24: qty 5

## 2022-02-24 MED ORDER — PRAVASTATIN SODIUM 40 MG PO TABS
40.0000 mg | ORAL_TABLET | Freq: Every day | ORAL | Status: DC
  Administered 2022-02-24 – 2022-03-03 (×8): 40 mg via ORAL
  Filled 2022-02-24 (×8): qty 1

## 2022-02-24 MED ORDER — LIDOCAINE-EPINEPHRINE 1 %-1:100000 IJ SOLN
INTRAMUSCULAR | Status: AC
  Filled 2022-02-24: qty 1

## 2022-02-24 MED ORDER — MIDAZOLAM HCL 2 MG/2ML IJ SOLN
INTRAMUSCULAR | Status: AC
  Filled 2022-02-24: qty 2

## 2022-02-24 MED ORDER — THROMBIN 5000 UNITS EX SOLR
OROMUCOSAL | Status: DC | PRN
  Administered 2022-02-24: 5 mL via TOPICAL

## 2022-02-24 MED ORDER — PROPOFOL 10 MG/ML IV BOLUS
INTRAVENOUS | Status: AC
  Filled 2022-02-24: qty 20

## 2022-02-24 MED ORDER — ROCURONIUM BROMIDE 10 MG/ML (PF) SYRINGE
PREFILLED_SYRINGE | INTRAVENOUS | Status: AC
  Filled 2022-02-24: qty 20

## 2022-02-24 MED ORDER — DEXAMETHASONE SODIUM PHOSPHATE 4 MG/ML IJ SOLN
4.0000 mg | Freq: Four times a day (QID) | INTRAMUSCULAR | Status: DC
  Administered 2022-02-24 – 2022-02-26 (×7): 4 mg via INTRAVENOUS
  Filled 2022-02-24 (×7): qty 1

## 2022-02-24 MED ORDER — PHENYLEPHRINE HCL-NACL 20-0.9 MG/250ML-% IV SOLN
INTRAVENOUS | Status: DC | PRN
  Administered 2022-02-24: 25 ug/min via INTRAVENOUS

## 2022-02-24 MED ORDER — SODIUM CHLORIDE 0.9% FLUSH
3.0000 mL | Freq: Two times a day (BID) | INTRAVENOUS | Status: DC
  Administered 2022-02-24 – 2022-03-02 (×12): 3 mL via INTRAVENOUS

## 2022-02-24 MED ORDER — METHOCARBAMOL 1000 MG/10ML IJ SOLN
500.0000 mg | Freq: Four times a day (QID) | INTRAVENOUS | Status: DC | PRN
  Filled 2022-02-24: qty 5

## 2022-02-24 MED ORDER — OXYCODONE-ACETAMINOPHEN 5-325 MG PO TABS
1.0000 | ORAL_TABLET | Freq: Four times a day (QID) | ORAL | Status: DC | PRN
  Administered 2022-02-26 – 2022-03-02 (×3): 2 via ORAL
  Filled 2022-02-24 (×3): qty 2

## 2022-02-24 MED ORDER — LIDOCAINE 2% (20 MG/ML) 5 ML SYRINGE
INTRAMUSCULAR | Status: AC
Start: 2022-02-24 — End: ?
  Filled 2022-02-24: qty 10

## 2022-02-24 MED ORDER — ACETAMINOPHEN 325 MG PO TABS: 650.0000 mg | ORAL_TABLET | ORAL | Status: DC | PRN

## 2022-02-24 MED ORDER — BISACODYL 10 MG RE SUPP: 10.0000 mg | Freq: Every day | RECTAL | Status: DC | PRN

## 2022-02-24 MED ORDER — SODIUM CHLORIDE 0.9 % IV SOLN
1.0000 g | INTRAVENOUS | Status: AC
  Administered 2022-02-25 – 2022-02-27 (×3): 1 g via INTRAVENOUS
  Filled 2022-02-24 (×3): qty 10

## 2022-02-24 MED ORDER — CEFAZOLIN SODIUM-DEXTROSE 2-4 GM/100ML-% IV SOLN
2.0000 g | Freq: Three times a day (TID) | INTRAVENOUS | Status: DC
  Filled 2022-02-24: qty 100

## 2022-02-24 MED ORDER — FENTANYL CITRATE (PF) 100 MCG/2ML IJ SOLN: 25.0000 ug | INTRAMUSCULAR | Status: DC | PRN

## 2022-02-24 MED ORDER — AMISULPRIDE (ANTIEMETIC) 5 MG/2ML IV SOLN: 10.0000 mg | Freq: Once | INTRAVENOUS | Status: DC | PRN

## 2022-02-24 MED ORDER — ONDANSETRON HCL 4 MG/2ML IJ SOLN: 4.0000 mg | Freq: Once | INTRAMUSCULAR | Status: DC | PRN

## 2022-02-24 MED ORDER — POLYETHYLENE GLYCOL 3350 17 G PO PACK: 17.0000 g | PACK | Freq: Every day | ORAL | Status: DC | PRN

## 2022-02-24 MED ORDER — THROMBIN 5000 UNITS EX SOLR: OROMUCOSAL | Status: DC | PRN

## 2022-02-24 MED ORDER — FLEET ENEMA 7-19 GM/118ML RE ENEM: 1.0000 | ENEMA | Freq: Once | RECTAL | Status: DC | PRN

## 2022-02-24 MED ORDER — ACETAMINOPHEN 650 MG RE SUPP: 650.0000 mg | RECTAL | Status: DC | PRN

## 2022-02-24 MED ORDER — PROPOFOL 10 MG/ML IV BOLUS
INTRAVENOUS | Status: DC | PRN
  Administered 2022-02-24: 120 mg via INTRAVENOUS

## 2022-02-24 MED ORDER — HYDRALAZINE HCL 50 MG PO TABS
50.0000 mg | ORAL_TABLET | Freq: Two times a day (BID) | ORAL | Status: DC
Start: 2022-02-24 — End: 2022-03-03
  Administered 2022-02-24 – 2022-03-03 (×14): 50 mg via ORAL
  Filled 2022-02-24 (×14): qty 1

## 2022-02-24 MED ORDER — PHENOL 1.4 % MT LIQD: 1.0000 | OROMUCOSAL | Status: DC | PRN

## 2022-02-24 MED ORDER — ONDANSETRON HCL 4 MG/2ML IJ SOLN
INTRAMUSCULAR | Status: AC
  Filled 2022-02-24: qty 2

## 2022-02-24 MED ORDER — DEXAMETHASONE SODIUM PHOSPHATE 10 MG/ML IJ SOLN
INTRAMUSCULAR | Status: DC | PRN
  Administered 2022-02-24: 10 mg via INTRAVENOUS

## 2022-02-24 MED ORDER — ALUM & MAG HYDROXIDE-SIMETH 200-200-20 MG/5ML PO SUSP: 30.0000 mL | Freq: Four times a day (QID) | ORAL | Status: DC | PRN

## 2022-02-24 MED ORDER — PHENYLEPHRINE 80 MCG/ML (10ML) SYRINGE FOR IV PUSH (FOR BLOOD PRESSURE SUPPORT)
PREFILLED_SYRINGE | INTRAVENOUS | Status: DC | PRN
  Administered 2022-02-24 (×2): 80 ug via INTRAVENOUS

## 2022-02-24 MED ORDER — LACTATED RINGERS IV SOLN
INTRAVENOUS | Status: DC | PRN
Start: 2022-02-24 — End: 2022-02-24

## 2022-02-24 MED ORDER — BUPIVACAINE HCL (PF) 0.5 % IJ SOLN
INTRAMUSCULAR | Status: AC
  Filled 2022-02-24: qty 30

## 2022-02-24 MED ORDER — METHOCARBAMOL 500 MG PO TABS
500.0000 mg | ORAL_TABLET | Freq: Four times a day (QID) | ORAL | Status: DC | PRN
  Administered 2022-02-26: 500 mg via ORAL
  Filled 2022-02-24: qty 1

## 2022-02-24 MED ORDER — ONDANSETRON HCL 4 MG/2ML IJ SOLN: 4.0000 mg | Freq: Four times a day (QID) | INTRAMUSCULAR | Status: DC | PRN

## 2022-02-24 SURGICAL SUPPLY — 55 items
ADH SKN CLS APL DERMABOND .7 (GAUZE/BANDAGES/DRESSINGS) ×1
BAG COUNTER SPONGE SURGICOUNT (BAG) ×2 IMPLANT
BAG SPNG CNTER NS LX DISP (BAG) ×1
BAND INSRT 18 STRL LF DISP RB (MISCELLANEOUS)
BAND RUBBER #18 3X1/16 STRL (MISCELLANEOUS) IMPLANT
BIT DRILL ACP 15 (DRILL) IMPLANT
BIT DRILL NEURO 2X3.1 SFT TUCH (MISCELLANEOUS) ×1 IMPLANT
BNDG GAUZE ELAST 4 BULKY (GAUZE/BANDAGES/DRESSINGS) IMPLANT
BUR BARREL STRAIGHT FLUTE 4.0 (BURR) IMPLANT
CAGE CERV MOD 6X15X12 7D (Cage) ×1 IMPLANT
CAGE CERV MOD 7X15X12 7D (Cage) ×1 IMPLANT
CANISTER SUCT 3000ML PPV (MISCELLANEOUS) ×2 IMPLANT
DECANTER SPIKE VIAL GLASS SM (MISCELLANEOUS) ×1 IMPLANT
DERMABOND ADVANCED (GAUZE/BANDAGES/DRESSINGS) ×1
DERMABOND ADVANCED .7 DNX12 (GAUZE/BANDAGES/DRESSINGS) ×1 IMPLANT
DRAPE HALF SHEET 40X57 (DRAPES) ×1 IMPLANT
DRAPE LAPAROTOMY 100X72 PEDS (DRAPES) ×2 IMPLANT
DRAPE MICROSCOPE LEICA (MISCELLANEOUS) IMPLANT
DRILL ACP 15 (DRILL) ×2
DRILL NEURO 2X3.1 SOFT TOUCH (MISCELLANEOUS) ×2
DURAPREP 6ML APPLICATOR 50/CS (WOUND CARE) ×2 IMPLANT
ELECT COATED BLADE 2.86 ST (ELECTRODE) ×2 IMPLANT
ELECT REM PT RETURN 9FT ADLT (ELECTROSURGICAL) ×2
ELECTRODE REM PT RTRN 9FT ADLT (ELECTROSURGICAL) ×1 IMPLANT
GAUZE 4X4 16PLY ~~LOC~~+RFID DBL (SPONGE) IMPLANT
GAUZE SPONGE 4X4 12PLY STRL (GAUZE/BANDAGES/DRESSINGS) ×2 IMPLANT
GLOVE BIOGEL PI IND STRL 8.5 (GLOVE) ×1 IMPLANT
GLOVE BIOGEL PI INDICATOR 8.5 (GLOVE) ×1
GLOVE ECLIPSE 8.5 STRL (GLOVE) ×2 IMPLANT
GLOVE EXAM NITRILE XL STR (GLOVE) IMPLANT
GOWN STRL REUS W/ TWL LRG LVL3 (GOWN DISPOSABLE) IMPLANT
GOWN STRL REUS W/ TWL XL LVL3 (GOWN DISPOSABLE) IMPLANT
GOWN STRL REUS W/TWL 2XL LVL3 (GOWN DISPOSABLE) ×2 IMPLANT
GOWN STRL REUS W/TWL LRG LVL3 (GOWN DISPOSABLE) ×2
GOWN STRL REUS W/TWL XL LVL3 (GOWN DISPOSABLE)
HALTER HD/CHIN CERV TRACTION D (MISCELLANEOUS) ×2 IMPLANT
HEMOSTAT POWDER KIT SURGIFOAM (HEMOSTASIS) ×3 IMPLANT
KIT BASIN OR (CUSTOM PROCEDURE TRAY) ×2 IMPLANT
KIT TURNOVER KIT B (KITS) ×2 IMPLANT
NDL SPNL 22GX3.5 QUINCKE BK (NEEDLE) ×1 IMPLANT
NEEDLE HYPO 22GX1.5 SAFETY (NEEDLE) ×2 IMPLANT
NEEDLE SPNL 22GX3.5 QUINCKE BK (NEEDLE) ×2 IMPLANT
NS IRRIG 1000ML POUR BTL (IV SOLUTION) ×2 IMPLANT
PACK LAMINECTOMY NEURO (CUSTOM PROCEDURE TRAY) ×2 IMPLANT
PAD ARMBOARD 7.5X6 YLW CONV (MISCELLANEOUS) ×9 IMPLANT
PATTIES SURGICAL .5 X1 (DISPOSABLE) ×2 IMPLANT
PLATE ACP 1.6X38 2LVL (Plate) ×1 IMPLANT
PUTTY DBM PROPEL SM (Putty) ×1 IMPLANT
SCREW ACP VA ST 3.5X15 (Screw) ×6 IMPLANT
SET WALTER ACTIVATION W/DRAPE (SET/KITS/TRAYS/PACK) ×3 IMPLANT
SPONGE INTESTINAL PEANUT (DISPOSABLE) ×2 IMPLANT
SUT VIC AB 4-0 RB1 18 (SUTURE) ×5 IMPLANT
TOWEL GREEN STERILE (TOWEL DISPOSABLE) ×2 IMPLANT
TOWEL GREEN STERILE FF (TOWEL DISPOSABLE) ×2 IMPLANT
WATER STERILE IRR 1000ML POUR (IV SOLUTION) ×2 IMPLANT

## 2022-02-24 NOTE — Progress Notes (Signed)
PT Cancellation Note ? ?Patient Details ?Name: Christie Meza ?MRN: [id number] ?DOB: 1946-09-01 ? ? ?Cancelled Treatment:    Reason Eval/Treat Not Completed: Patient at procedure or test/unavailable Pt to OR. Will follow up as schedule allows.  ? ?Farley Ly, PT, DPT  ?Acute Rehabilitation Services  ?Pager: [phone] ?Office: [phone] ? ? ? ?Christie Meza ?02/24/2022, 12:08 PM ?

## 2022-02-24 NOTE — Consult Note (Signed)
Reason for Consult: Weakness in the arms and left side of the body ?Referring Physician: Dr. Allena KatzPatel ? ?Christie FowlerBrenda F Meza is an 76 y.o. female.  ?HPI: Patient is a 76 year old individual who has had history of weakness in the left lower extremity with some falls over several weeks.  At time these have gotten progressively worse and more frequent and last night she noted little to no control of her left lower extremity with severe pain in the left leg.  She initially presented as a code stroke and this was ruled out work-up was followed with MRIs of the cervical and lumbar spines which demonstrate a large centrally herniated disc at the level of C3-4 with cord signal change and compression of the cord itself.  A lesser spondylitic lesion with flattening of the cord is noted at C4-5.  No cord signal changes noted at that level. ? ?On evaluation the patient is noted to have marked weakness of the left lower extremity with 1-2 out of 5 strength in the iliopsoas and quad the tibialis anterior and gastrocs on the left she has 4 out of 5 strength in the tibialis anterior and gastrocs on the right and proximal strength appears intact at 5 out of 5.  She has severe dysesthetic pain in the left lower extremity all the way up to the level of the trunk.  She also has marked weakness in the intrinsic functions of the hands on both sides but markedly worse on the left than on the right.  More proximal arm strength is intact in the deltoids and biceps.  This is graded at 4 out of 5 triceps strength is 4- out of 5.  Sensation is diminished in the left upper extremity compared to the right upper extremity to pin and light touch.  Reflexes are absent in the left upper extremity and right upper extremity she has 2 beats of clonus in the ankles bilaterally. ? ?The patient has been advised regarding the need for surgical intervention to decompress C3-4 and C4-5.  I had a phone conversation with the patient's daughter and explained that the  risks and rationale of the surgery given the profound weakness that she is experiencing and have advised that surgery be done on an urgent basis and she is now being admitted for this procedure. ? ? ? ?Past Medical History:  ?Diagnosis Date  ? Bipolar 1 disorder (HCC)   ? Hypertension   ? Obesity   ? ? ?Past Surgical History:  ?Procedure Laterality Date  ? ABDOMINAL HYSTERECTOMY  ~2002  ? ? ?Family History  ?Problem Relation Age of Onset  ? Kidney disease Mother   ? Hypertension Mother   ? Kidney disease Brother   ? ? ?Social History:  reports that she has never smoked. She has never used smokeless tobacco. She reports current alcohol use. She reports current drug use. ? ?Allergies:  ?Allergies  ?Allergen Reactions  ? Aspirin Hives  ? ? ?Medications: I have reviewed the patient's current medications. ? ?Results for orders placed or performed during the hospital encounter of 02/23/22 (from the past 48 hour(s))  ?Basic metabolic panel     Status: Abnormal  ? Collection Time: 02/23/22  8:15 AM  ?Result Value Ref Range  ? Sodium 142 135 - 145 mmol/L  ? Potassium 3.4 (L) 3.5 - 5.1 mmol/L  ? Chloride 109 98 - 111 mmol/L  ? CO2 25 22 - 32 mmol/L  ? Glucose, Bld 126 (H) 70 - 99 mg/dL  ?  Comment: Glucose reference range applies only to samples taken after fasting for at least 8 hours.  ? BUN 18 8 - 23 mg/dL  ? Creatinine, Ser 1.34 (H) 0.44 - 1.00 mg/dL  ? Calcium 10.6 (H) 8.9 - 10.3 mg/dL  ? GFR, Estimated 42 (L) >60 mL/min  ?  Comment: (NOTE) ?Calculated using the CKD-EPI Creatinine Equation (2021) ?  ? Anion gap 8 5 - 15  ?  Comment: Performed at [facility], [address], [place], [place] [address]  ?CBC with Differential     Status: Abnormal  ? Collection Time: 02/23/22  8:15 AM  ?Result Value Ref Range  ? WBC 6.3 4.0 - 10.5 K/uL  ? RBC 4.33 3.87 - 5.11 MIL/uL  ? Hemoglobin 11.7 (L) 12.0 - 15.0 g/dL  ? HCT 35.8 (L) 36.0 - 46.0 %  ? MCV 82.7 80.0 - 100.0 fL  ? MCH 27.0 26.0 - 34.0 pg  ? MCHC 32.7 30.0 - 36.0  g/dL  ? RDW 16.0 (H) 11.5 - 15.5 %  ? Platelets 177 150 - 400 K/uL  ? nRBC 0.0 0.0 - 0.2 %  ? Neutrophils Relative % 56 %  ? Neutro Abs 3.5 1.7 - 7.7 K/uL  ? Lymphocytes Relative 34 %  ? Lymphs Abs 2.2 0.7 - 4.0 K/uL  ? Monocytes Relative 6 %  ? Monocytes Absolute 0.4 0.1 - 1.0 K/uL  ? Eosinophils Relative 3 %  ? Eosinophils Absolute 0.2 0.0 - 0.5 K/uL  ? Basophils Relative 1 %  ? Basophils Absolute 0.0 0.0 - 0.1 K/uL  ? Immature Granulocytes 0 %  ? Abs Immature Granulocytes 0.01 0.00 - 0.07 K/uL  ?  Comment: Performed at [facility], [address], [place], [place] [address]  ?Ethanol     Status: None  ? Collection Time: 02/23/22  3:00 PM  ?Result Value Ref Range  ? Alcohol, Ethyl (B) <10 <10 mg/dL  ?  Comment: (NOTE) ?Lowest detectable limit for serum alcohol is 10 mg/dL. ? ?For medical purposes only. ?Performed at [facility], [address] [place], [place] ?[address] ?  ?Protime-INR     Status: None  ? Collection Time: 02/23/22  3:00 PM  ?Result Value Ref Range  ? Prothrombin Time 13.3 11.4 - 15.2 seconds  ? INR 1.0 0.8 - 1.2  ?  Comment: (NOTE) ?INR goal varies based on device and disease states. ?Performed at [facility], [address] [place], [place] ?[address] ?  ?APTT     Status: None  ? Collection Time: 02/23/22  3:00 PM  ?Result Value Ref Range  ? aPTT 30 24 - 36 seconds  ?  Comment: Performed at [facility], [address] [place], [place] [address]  ?Urine rapid drug screen (hosp performed)     Status: None  ? Collection Time: 02/23/22  4:59 PM  ?Result Value Ref Range  ? Opiates NONE DETECTED NONE DETECTED  ? Cocaine NONE DETECTED NONE DETECTED  ? Benzodiazepines NONE DETECTED NONE DETECTED  ? Amphetamines NONE DETECTED NONE DETECTED  ? Tetrahydrocannabinol NONE DETECTED NONE DETECTED  ? Barbiturates NONE DETECTED NONE DETECTED  ?  Comment: (NOTE) ?DRUG SCREEN FOR MEDICAL PURPOSES ?ONLY.  IF CONFIRMATION IS NEEDED ?FOR ANY PURPOSE, NOTIFY LAB ?WITHIN 5 DAYS. ? ?LOWEST  DETECTABLE LIMITS ?FOR URINE DRUG SCREEN ?Drug Class  Cutoff (ng/mL) ?Amphetamine and metabolites    1000 ?Barbiturate and metabolites    200 ?Benzodiazepine                 200 ?Tricyclics and metabolites     300 ?Opiates and metabolites        300 ?Cocaine and metabolites        300 ?THC                            50 ?Performed at [facility], [address] [place], [place] ?[address] ?  ?Urinalysis, Routine w reflex microscopic Urine, Clean Catch     Status: Abnormal  ? Collection Time: 02/23/22  4:59 PM  ?Result Value Ref Range  ? Color, Urine YELLOW YELLOW  ? APPearance HAZY (A) CLEAR  ? Specific Gravity, Urine 1.016 1.005 - 1.030  ? pH 5.0 5.0 - 8.0  ? Glucose, UA NEGATIVE NEGATIVE mg/dL  ? Hgb urine dipstick NEGATIVE NEGATIVE  ? Bilirubin Urine NEGATIVE NEGATIVE  ? Ketones, ur NEGATIVE NEGATIVE mg/dL  ? Protein, ur NEGATIVE NEGATIVE mg/dL  ? Nitrite POSITIVE (A) NEGATIVE  ? Leukocytes,Ua SMALL (A) NEGATIVE  ? RBC / HPF 0-5 0 - 5 RBC/hpf  ? WBC, UA 6-10 0 - 5 WBC/hpf  ? Bacteria, UA MANY (A) NONE SEEN  ? Squamous Epithelial / LPF 0-5 0 - 5  ? Mucus PRESENT   ? Hyaline Casts, UA PRESENT   ?  Comment: Performed at [facility], [address], [place], [place] [address]  ?Basic metabolic panel     Status: Abnormal  ? Collection Time: 02/24/22  5:21 AM  ?Result Value Ref Range  ? Sodium 143 135 - 145 mmol/L  ? Potassium 4.2 3.5 - 5.1 mmol/L  ?  Comment: DELTA CHECK NOTED ?NO VISIBLE HEMOLYSIS ?  ? Chloride 114 (H) 98 - 111 mmol/L  ? CO2 20 (L) 22 - 32 mmol/L  ? Glucose, Bld 89 70 - 99 mg/dL  ?  Comment: Glucose reference range applies only to samples taken after fasting for at least 8 hours.  ? BUN 16 8 - 23 mg/dL  ? Creatinine, Ser 1.07 (H) 0.44 - 1.00 mg/dL  ? Calcium 10.9 (H) 8.9 - 10.3 mg/dL  ? GFR, Estimated 55 (L) >60 mL/min  ?  Comment: (NOTE) ?Calculated using the CKD-EPI Creatinine Equation (2021) ?  ? Anion gap 9 5 - 15  ?  Comment: Performed at [facility],  [address], [place], [place] [address]  ?CBC     Status: Abnormal  ? Collection Time: 02/24/22  5:21 AM  ?Result Value Ref Range  ? WBC 6.7 4.0 - 10.5 K/uL  ? RBC 4.32 3.87 - 5.11 MIL/uL  ? Hemoglobin 1

## 2022-02-24 NOTE — Op Note (Signed)
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 ?Postoperative diagnosis: Same ?Procedure: Anterior cervical decompression C3-4 C4-5 arthrodesis with modulus titanium spacer and allograft anterior plate fixation [id number]. ?Surgeon: Kristeen Miss ?Anesthesia: General endotracheal ?Indications: Christie Meza is a 75 year old individual who is becoming progressively weaker on her left side and had several falls.  She was not able to move her left lower extremity adequately on the work-up ensued in the emergency department including a code stroke which ruled out any intracranial process.  An MRI of the cervical spine lumbar spine was then performed which demonstrated high-grade stenosis at C3-4 with cord signal changes secondary to a large central disc herniation.  There is also significant stenosis at C4-5 secondary to spondylitic degeneration.  Patient was advised regarding the need for surgery. ? ?Procedure: The patient was brought to the operating room supine on the stretcher.  After smooth induction of general endotracheal anesthesia, she was carefully placed in 5 pounds of halter traction the neck was prepped with alcohol DuraPrep and draped in a sterile fashion.  A transverse incision was created in the left side of the neck and carried down through the platysma.  The plane between the sternocleidomastoid and the strap muscles was then dissected bluntly until the prevertebral space was reached.  First identifiable disc space is noted to be that of C3-4.  Then by carefully dissecting the longus coli muscle off of either side of midline a 50 mm self-retaining retractor was placed into the wound and held in position using the Walter arm.  The ventral aspect of the disc was then opened with a 15 blade and a combination of curettes and rongeurs was used to evacuate a substantial quantity of severely degenerated desiccated  disc material.  As the region of the posterior longitudinal ligament was reached there was noted to be several fragments of disc material herniated through the area of the ligament and compressing the ventral aspect of the dura these were removed in a piecemeal fashion and ultimately the remnants of the posterior ligament were also opened and removed the dura dural space in the epidural space was decompressed well in this fashion.  The dissection was carried out into either lateral recess and further degenerated disc material was removed from here also.  Once the decompression was completed epidural venous bleeding was controlled with some small pledgets of Gelfoam and Surgifoam which were then irrigated away.  The interspace was smoothed using a 2 mm matchstick bur and then the interspace was sized for a 7 x 12 x 14 mm spacer with 7 degrees of lordosis.  This was filled with demineralized bone matrix and placed into the interspace.  Attention was then turned to C4-5 where similar process was carried out here the interspace was only able to hold a 6 mm spacer also 12 x 14 mm with 7 degrees of lordosis.  Once the decompression was completed and the interbody graft was placed at 38 mm ACP plate was fixed to the ventral aspect of the vertebral bodies from C3-C5 and held in position with six 3.5 x 15 mm variable angle screws.  Final radiograph was obtained demonstrating good position of the construct.  Retractors were removed hemostasis in the soft tissues was checked carefully and once verified the platysma was closed with 4-0 Vicryl in interrupted fashion and 4-0 Vicryl was used in the subcuticular tissues Dermabond was placed on the skin blood loss for the procedure was  estimated at 125 cc. ? ?

## 2022-02-24 NOTE — Progress Notes (Signed)
Pt going down to OR. CHG given. Pt is alert and oriented x4. Able to sign consent in OR when given. ? ?

## 2022-02-24 NOTE — Transfer of Care (Signed)
Immediate Anesthesia Transfer of Care Note ? ?Patient: Christie Meza ? ?Procedure(s) Performed: ANTERIOR CERVICAL THREE- FOUR AND CERVICAL FOUR THROUGH FIVE DECOMPRESSION/DISCECTOMY FUSION 2 LEVELS ? ?Patient Location: PACU ? ?Anesthesia Type:General ? ?Level of Consciousness: drowsy ? ?Airway & Oxygen Therapy: Patient Spontanous Breathing ? ?Post-op Assessment: Report given to RN and Post -op Vital signs reviewed and stable ? ?Post vital signs: Reviewed and stable ? ?Last Vitals:  ?Vitals Value Taken Time  ?BP 148/72 02/24/22 1501  ?Temp    ?Pulse 74 02/24/22 1504  ?Resp 12 02/24/22 1504  ?SpO2 95 % 02/24/22 1504  ?Vitals shown include unvalidated device data. ? ?Last Pain:  ?Vitals:  ? 02/24/22 1150  ?TempSrc:   ?PainSc: 0-No pain  ?   ? ?Patients Stated Pain Goal: 0 (02/24/22 1045) ? ?Complications: No notable events documented. ?

## 2022-02-24 NOTE — Anesthesia Preprocedure Evaluation (Addendum)
Anesthesia Evaluation  ?Patient identified by MRN, date of birth, ID band ?Patient awake ? ? ? ?Reviewed: ?Allergy & Precautions, NPO status , Patient's Chart, lab work & pertinent test results ? ?Airway ?Mallampati: III ? ?TM Distance: >3 FB ?Neck ROM: Limited ? ? ? Dental ?no notable dental hx. ? ?  ?Pulmonary ?neg pulmonary ROS,  ?  ?Pulmonary exam normal ?breath sounds clear to auscultation ? ? ? ? ? ? Cardiovascular ?hypertension, Pt. on medications ?negative cardio ROS ?Normal cardiovascular exam ?Rhythm:Regular Rate:Normal ? ? ?  ?Neuro/Psych ?PSYCHIATRIC DISORDERS Bipolar Disorder Acute left sided weakness ?negative neurological ROS ?   ? GI/Hepatic ?negative GI ROS, (+)  ?  ? substance abuse ? alcohol use,   ?Endo/Other  ?Morbid obesity ? Renal/GU ?Renal InsufficiencyRenal disease  ?negative genitourinary ?  ?Musculoskeletal ? ?(+) Arthritis , Osteoarthritis,   ? Abdominal ?  ?Peds ?negative pediatric ROS ?(+)  Hematology ?negative hematology ROS ?(+)   ?Anesthesia Other Findings ? ? Reproductive/Obstetrics ?negative OB ROS ? ?  ? ? ? ? ? ? ? ? ? ? ? ? ? ?  ?  ? ? ? ? ? ? ?Anesthesia Physical ?Anesthesia Plan ? ?ASA: 3 and emergent ? ?Anesthesia Plan: General  ? ?Post-op Pain Management: Tylenol PO (pre-op)* and Gabapentin PO (pre-op)*  ? ?Induction:  ? ?PONV Risk Score and Plan: Treatment may vary due to age or medical condition, Ondansetron and Dexamethasone ? ?Airway Management Planned: Oral ETT and Video Laryngoscope Planned ? ?Additional Equipment: None ? ?Intra-op Plan:  ? ?Post-operative Plan: Extubation in OR ? ?Informed Consent: I have reviewed the patients History and Physical, chart, labs and discussed the procedure including the risks, benefits and alternatives for the proposed anesthesia with the patient or authorized representative who has indicated his/her understanding and acceptance.  ? ? ? ?Dental advisory given ? ?Plan Discussed with: Anesthesiologist,  CRNA and Surgeon ? ?Anesthesia Plan Comments:   ? ? ? ? ? ?Anesthesia Quick Evaluation ? ?

## 2022-02-24 NOTE — Progress Notes (Signed)
Pt has arrived back from PACU. ?

## 2022-02-24 NOTE — Anesthesia Procedure Notes (Signed)
Procedure Name: Intubation ?Date/Time: 02/24/2022 12:35 PM ?Performed by: Wilburn Cornelia, CRNA ?Pre-anesthesia Checklist: Patient identified, Emergency Drugs available, Suction available, Patient being monitored and Timeout performed ?Patient Re-evaluated:Patient Re-evaluated prior to induction ?Oxygen Delivery Method: Circle system utilized ?Preoxygenation: Pre-oxygenation with 100% oxygen ?Induction Type: IV induction ?Ventilation: Mask ventilation without difficulty ?Grade View: Grade I ?Tube type: Oral ?Tube size: 7.0 mm ?Number of attempts: 1 ?Airway Equipment and Method: Stylet ?Placement Confirmation: ETT inserted through vocal cords under direct vision, positive ETCO2, CO2 detector and breath sounds checked- equal and bilateral ?Secured at: 20 cm ?Tube secured with: Tape ?Dental Injury: Teeth and Oropharynx as per pre-operative assessment  ?Comments: Head and neck remain neutral with intubation ? ? ? ? ?

## 2022-02-24 NOTE — Anesthesia Postprocedure Evaluation (Signed)
Anesthesia Post Note ? ?Patient: Christie Meza ? ?Procedure(s) Performed: ANTERIOR CERVICAL THREE- FOUR AND CERVICAL FOUR THROUGH FIVE DECOMPRESSION/DISCECTOMY FUSION 2 LEVELS ? ?  ? ?Patient location during evaluation: PACU ?Anesthesia Type: General ?Level of consciousness: awake ?Pain management: pain level controlled ?Vital Signs Assessment: post-procedure vital signs reviewed and stable ?Respiratory status: spontaneous breathing and respiratory function stable ?Cardiovascular status: stable ?Postop Assessment: no apparent nausea or vomiting ?Anesthetic complications: no ? ? ?No notable events documented. ? ?Last Vitals:  ?Vitals:  ? 02/24/22 1515 02/24/22 1530  ?BP: (!) 149/75 (!) 155/79  ?Pulse: 70 66  ?Resp: 12 16  ?Temp:  36.6 ?C  ?SpO2: 94% 98%  ?  ?Last Pain:  ?Vitals:  ? 02/24/22 1530  ?TempSrc:   ?PainSc: 0-No pain  ? ? ?  ?  ?  ?  ?  ?  ? ?Merlinda Frederick ? ? ? ? ?

## 2022-02-24 NOTE — Hospital Course (Addendum)
Christie Meza is a 76 y.o. female with medical history significant of hypertension, morbid obesity, bipolar disorder. ?Presents to complaints of weakness and pain involving her left leg.  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 ?Neurosurgery consulted patient underwent anterior cervical decompression on 5/4. ?Awaiting transfer to CIR now. ?

## 2022-02-24 NOTE — Plan of Care (Signed)
Pt is doing well. Moving well with all extremities since surgery. Better movement than before.  ? ?Problem: Education: ?Goal: Knowledge of General Education information will improve ?Description: Including pain rating scale, medication(s)/side effects and non-pharmacologic comfort measures ?Outcome: Progressing ?  ?Problem: Health Behavior/Discharge Planning: ?Goal: Ability to manage health-related needs will improve ?Outcome: Progressing ?  ?Problem: Clinical Measurements: ?Goal: Ability to maintain clinical measurements within normal limits will improve ?Outcome: Progressing ?Goal: Will remain free from infection ?Outcome: Progressing ?Goal: Diagnostic test results will improve ?Outcome: Progressing ?Goal: Respiratory complications will improve ?Outcome: Progressing ?Goal: Cardiovascular complication will be avoided ?Outcome: Progressing ?  ?Problem: Activity: ?Goal: Risk for activity intolerance will decrease ?Outcome: Progressing ?  ?Problem: Nutrition: ?Goal: Adequate nutrition will be maintained ?Outcome: Progressing ?  ?Problem: Coping: ?Goal: Level of anxiety will decrease ?Outcome: Progressing ?  ?Problem: Elimination: ?Goal: Will not experience complications related to bowel motility ?Outcome: Progressing ?Goal: Will not experience complications related to urinary retention ?Outcome: Progressing ?  ?Problem: Pain Managment: ?Goal: General experience of comfort will improve ?Outcome: Progressing ?  ?Problem: Safety: ?Goal: Ability to remain free from injury will improve ?Outcome: Progressing ?  ?Problem: Skin Integrity: ?Goal: Risk for impaired skin integrity will decrease ?Outcome: Progressing ?  ?

## 2022-02-24 NOTE — Progress Notes (Signed)
?Progress Note ?Patient: Christie Meza [id number] DOB: December 10, 1945 DOA: 02/23/2022  ?DOS: the patient was seen and examined on 02/24/2022 ? ?Brief hospital course: ?Christie Meza is a 76 y.o. female with medical history significant of hypertension, morbid obesity, bipolar disorder. ?Presents to complaints of weakness and pain involving her left leg.  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 ?Neurosurgery consulted patient underwent anterior cervical decompression on 5/4. ? ?Assessment and Plan: ?* Cervical spondylitic cord compression ?Presents with complaints of left-sided weakness and bilateral hand numbness as well as recurrent fall. ?CT head unremarkable. ?Given that her symptoms have been ongoing for more than 3 weeks stroke is less likely. 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 ?MRI brain negative for any acute stroke. 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 ?Neurosurgery consulted. ?Patient under went anterior cervical decompression and C4-C5 arthrodesis and plate fixation.  On 5/4. ? ?Bilateral hand numbness ?Possibility of carpal tunnel cannot be ruled out. ?If the MRIs are negative would recommend outpatient work-up for carpal tunnel. ? ?Recurrent falls ?And weakness.  Without any syncopal events.  Without any head or neck injury reported by the patient's as well. ?PT OT consulted. ? ?Bilateral knee pain ?Worsening after fall. ?We will check lower extremity Doppler. ?Check x-ray on the left as well. ?Also check hip x-ray on left. ? ?AKI (acute kidney injury) ([place]) ?No recent baseline serum creatinine available in our system.  Currently serum creatinine 1.3.  In the past serum creatinine was less than 1. ?Work-up also reveals hypercalcemia as well as hypokalemia. ?Monitor potassium level monitor calcium level. ?Continue IV fluids. ? ?Urinary  incontinence ?UTI. ?Ongoing urinary incontinence for last few weeks. ?This is associated with pain related immobility. ?Urine positive for nitrites therefore we will go ahead and treat as UTI with IV ceftriaxone for now. ? ?Hypertension ?Blood pressure elevated. ?Patient has missed her home medications. ?Continue home regimen for now. ? ?Obesity, Class III, BMI 40-49.9 (morbid obesity) ([place]) ?Body mass index is 41.93 kg/m?Marland Kitchen  ?Placing the patient at high risk for poor outcome. ?Possibility of sleep apnea/obesity hypoventilation syndrome cannot be ruled out. ?Monitor for now. ? ?Subjective: No nausea no vomiting no fever no chills no chest pain abdominal pain. ? ?Physical Exam: ?Vitals:  ? 02/24/22 1130 02/24/22 1500 02/24/22 1515 02/24/22 1530  ?BP: (!) 143/73 (!) 148/72 (!) 149/75 (!) 155/79  ?Pulse: (!) 57 76 70 66  ?Resp: 16 11 12 16   ?Temp: 97.9 ?F (36.6 ?C) 97.8 ?F (36.6 ?C)  97.8 ?F (36.6 ?C)  ?TempSrc: Oral     ?SpO2: 100% 97% 94% 98%  ?Weight: 114.3 kg     ?Height: 5\' 5"  (1.651 m)     ? ?General: Appear in mild distress; no visible Abnormal Neck Mass Or lumps, Conjunctiva normal ?Cardiovascular: S1 and S2 Present, no Murmur, ?Respiratory: good respiratory effort, Bilateral Air entry present and CTA, no Crackles, no wheezes ?Abdomen: Bowel Sound present, Non tender ?Extremities: no Pedal edema ?Neurology: alert and oriented to time, place, and person ?Gait not checked due to patient safety concerns  ? ?Data Reviewed: ?I have Reviewed nursing notes, Vitals, and Lab results since pt's last encounter. Pertinent lab results CBC and BMP ?I have ordered test including CBC and BMP ?I have discussed pt's care plan and test results with neurosurgery.  ? ?Family Communication: None at bedside ? ?Disposition: ?Status is: Inpatient ?Remains inpatient appropriate  because: Monitor postop recovery. ? ?Author: ?Berle Mull, MD ?02/24/2022 8:03 PM ? ?Please look on [email or web address] to find out who is on call. ?

## 2022-02-24 NOTE — Progress Notes (Signed)
OT Cancellation Note ? ?Patient Details ?Name: Christie Meza ?MRN: [id number] ?DOB: 04/20/1946 ? ? ?Cancelled Treatment:    Reason Eval/Treat Not Completed: Patient at procedure or test/ unavailable. Pt in the OR at this time. OT will follow up as pt is medically ready for therapy session.  ? ?[name] ?02/24/2022, 2:13 PM ?

## 2022-02-25 ENCOUNTER — Encounter (HOSPITAL_COMMUNITY): Payer: Self-pay | Admitting: Neurological Surgery

## 2022-02-25 ENCOUNTER — Inpatient Hospital Stay (HOSPITAL_COMMUNITY): Payer: Medicare Other

## 2022-02-25 ENCOUNTER — Other Ambulatory Visit: Payer: Self-pay

## 2022-02-25 DIAGNOSIS — M4712 Other spondylosis with myelopathy, cervical region: Secondary | ICD-10-CM | POA: Diagnosis not present

## 2022-02-25 DIAGNOSIS — R609 Edema, unspecified: Secondary | ICD-10-CM

## 2022-02-25 DIAGNOSIS — D696 Thrombocytopenia, unspecified: Secondary | ICD-10-CM | POA: Diagnosis not present

## 2022-02-25 LAB — HEMOGLOBIN A1C
Hgb A1c MFr Bld: 5.7 % — ABNORMAL HIGH (ref 4.8–5.6)
Mean Plasma Glucose: 117 mg/dL

## 2022-02-25 LAB — BASIC METABOLIC PANEL
Anion gap: 5 (ref 5–15)
BUN: 14 mg/dL (ref 8–23)
CO2: 23 mmol/L (ref 22–32)
Calcium: 10.5 mg/dL — ABNORMAL HIGH (ref 8.9–10.3)
Chloride: 113 mmol/L — ABNORMAL HIGH (ref 98–111)
Creatinine, Ser: 0.98 mg/dL (ref 0.44–1.00)
GFR, Estimated: >60 mL/min (ref >60)
Glucose, Bld: 123 mg/dL — ABNORMAL HIGH (ref 70–99)
Potassium: 4.3 mmol/L (ref 3.5–5.1)
Sodium: 141 mmol/L (ref 135–145)

## 2022-02-25 LAB — CBC
HCT: 29.9 % — ABNORMAL LOW (ref 36.0–46.0)
Hemoglobin: 9.8 g/dL — ABNORMAL LOW (ref 12.0–15.0)
MCH: 26.5 pg (ref 26.0–34.0)
MCHC: 32.8 g/dL (ref 30.0–36.0)
MCV: 80.8 fL (ref 80.0–100.0)
Platelets: 116 10*3/uL — ABNORMAL LOW (ref 150–400)
RBC: 3.7 MIL/uL — ABNORMAL LOW (ref 3.87–5.11)
RDW: 15.7 % — ABNORMAL HIGH (ref 11.5–15.5)
WBC: 7.8 10*3/uL (ref 4.0–10.5)
nRBC: 0 % (ref 0.0–0.2)

## 2022-02-25 NOTE — Progress Notes (Signed)
Patient ID: Christie Meza, female   DOB: Feb 26, 1946, 76 y.o.   MRN: [id number] ?Vital signs are stable ?Patient is alert awake complains of some difficulty swallowing but is able to swallow adequately.  Incision is clean and dry.  Motor function in the upper extremities is good with 4 out of 5 grip.  Lower extremity strength appears to be improving on the left side compared to how it was yesterday.  Also much of the dysesthesia seems to be lessened.  She still notes that there is some dysesthesias but is not as severe as acute as it had been. ? ?I believe that she would benefit from comprehensive inpatient rehabilitation but the patient would like to go home.  If that is the case she would require significant intensive outpatient rehabilitation.  I am not sure that she will be very compliant there. ? ?I note the Doppler studies are negative.  Nonetheless the patient can be started on prophylactic treatment for deep venous thrombosis if that is felt necessary. ? ?I will be off this weekend but he may contact the neurosurgery service if any specific intervention is required. ?

## 2022-02-25 NOTE — Progress Notes (Signed)
Lower extremity venous has been completed.  ? ?Preliminary results in CV Proc.  ? ?Christie Meza ?02/25/2022 3:37 PM    ?

## 2022-02-25 NOTE — TOC Initial Note (Signed)
Transition of Care (TOC) - Initial/Assessment Note  ? ? ?Patient Details  ?Name: Christie Meza ?MRN: [id number] ?Date of Birth: 02-24-1946 ? ?Transition of Care (TOC) CM/SW Contact:    ?Kermit Balo, RN ?Phone Number: ?02/25/2022, 3:23 PM ? ?Clinical Narrative:                 ?Patient is from home with her grandson that assists her with her ADL's at home. He manages her medications and does the cooking. ?Daughter provides needed transportation.  ?Patient is requesting a 3 in 1 at d/c for home.  ?Recommendations are for CIR.  ?TOC following. ? ?Expected Discharge Plan: IP Rehab Facility ?Barriers to Discharge: Continued Medical Work up ? ? ?Patient Goals and CMS Choice ?  ?CMS Medicare.gov Compare Post Acute Care list provided to:: Patient ?Choice offered to / list presented to : Patient ? ?Expected Discharge Plan and Services ?Expected Discharge Plan: IP Rehab Facility ?  ?Discharge Planning Services: CM Consult ?Post Acute Care Choice: IP Rehab ?Living arrangements for the past 2 months: Single Family Home ?                ?  ?  ?  ?  ?  ?  ?  ?  ?  ?  ? ?Prior Living Arrangements/Services ?Living arrangements for the past 2 months: Single Family Home ?Lives with:: Other (Comment) (Grandson) ?Patient language and need for interpreter reviewed:: Yes ?Do you feel safe going back to the place where you live?: Yes      ?Need for Family Participation in Patient Care: Yes (Comment) ?Care giver support system in place?: Yes (comment) ?Current home services: DME (walker/ cane) ?Criminal Activity/Legal Involvement Pertinent to Current Situation/Hospitalization: No - Comment as needed ? ?Activities of Daily Living ?Home Assistive Devices/Equipment: Dan Humphreys (specify type) ?ADL Screening (condition at time of admission) ?Patient's cognitive ability adequate to safely complete daily activities?: Yes ?Is the patient deaf or have difficulty hearing?: No ?Does the patient have difficulty seeing, even when wearing  glasses/contacts?: No ?Does the patient have difficulty concentrating, remembering, or making decisions?: No ?Patient able to express need for assistance with ADLs?: Yes ?Does the patient have difficulty dressing or bathing?: Yes ?Independently performs ADLs?: No ?Communication: Independent ?Dressing (OT): Needs assistance ?Is this a change from baseline?: Pre-admission baseline ?Grooming: Needs assistance ?Is this a change from baseline?: Pre-admission baseline ?Feeding: Independent ?Bathing: Needs assistance ?Is this a change from baseline?: Pre-admission baseline ?Toileting: Needs assistance ?Is this a change from baseline?: Pre-admission baseline ?In/Out Bed: Dependent ?Is this a change from baseline?: Pre-admission baseline ?Walks in Home: Dependent ?Is this a change from baseline?: Pre-admission baseline ?Does the patient have difficulty walking or climbing stairs?: Yes ?Weakness of Legs: Both ?Weakness of Arms/Hands: Both ? ?Permission Sought/Granted ?  ?  ?   ?   ?   ?   ? ?Emotional Assessment ?Appearance:: Appears stated age ?Attitude/Demeanor/Rapport: Engaged ?Affect (typically observed): Accepting ?Orientation: : Oriented to Self, Oriented to Place, Oriented to  Time, Oriented to Situation ?  ?Psych Involvement: No (comment) ? 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 Diagnosis Date Noted  ? 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 Bilateral knee pain 02/23/2022  ? Recurrent falls 02/23/2022  ? Bilateral hand numbness 02/23/2022  ? Urinary incontinence 02/23/2022  ? UTI (urinary tract infection) 02/23/2022  ? AKI (acute kidney injury) (HCC) 02/23/2022  ?  Hypercalcemia 02/23/2022  ? Hypokalemia 02/23/2022  ? 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 Alcohol abuse   ? Chest pain 10/18/2013  ? Abnormal ECG 10/18/2013  ? Flank pain 10/18/2013  ?  Obesity, Class III, BMI 40-49.9 (morbid obesity) (HCC)   ? Bipolar 1 disorder (HCC)   ? Hypertension   ? Accidental acetaminophen overdose 04/17/2012  ?  Class: Acute  ? ?PCP:  Fleet Contras, MD ?Pharmacy:   ?[id number] [address], [place] - [phone] AT [facility] OF [address] ?[address] ?[place] [address] ?Phone: [phone] Fax: [phone] ? ? ? ? ?Social Determinants of Health (SDOH) Interventions ?  ? ?Readmission Risk Interventions ?   ? View : No data to display.  ?  ?  ?  ? ? ? ?

## 2022-02-25 NOTE — Evaluation (Signed)
Physical Therapy Evaluation ?Patient Details ?Name: Christie Meza ?MRN: [id number] ?DOB: [birth year] ?Today's Date: 02/25/2022 ? ?History of Present Illness ? 76 yo female presenting to ED on 5/3 with paresthesias of BLEs ongoing for 2-3 weeks after fall. 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 S/p C3-5 decompression on 5/4. PMH including hypertension, morbid obesity, bipolar disorder. ?  ?Clinical Impression ? Pt admitted with above diagnosis. PTA pt lived at home with her grandson. She ambulated with rollator and grandson assisted with ADLs. Pt currently with functional limitations due to the deficits listed below (see PT Problem List). On eval, pt required +2 min assist bed mobility, +2 mod assist sit to stand with RW, and +2 mod assist SPT with RW bed to recliner. Knee buckling noted with initial stance. Pt will benefit from skilled PT to increase their independence and safety with mobility to allow discharge to the venue listed below.   ?   ?   ? ?Recommendations for follow up therapy are one component of a multi-disciplinary discharge planning process, led by the attending physician.  Recommendations may be updated based on patient status, additional functional criteria and insurance authorization. ? ?Follow Up Recommendations Acute inpatient rehab (3hours/day) ? ?  ?Assistance Recommended at Discharge Frequent or constant Supervision/Assistance  ?Patient can return home with the following ? A lot of help with bathing/dressing/bathroom;Assistance with cooking/housework;Direct supervision/assist for medications management;Assist for transportation;A lot of help with walking and/or transfers;Direct supervision/assist for financial management ? ?  ?Equipment Recommendations Wheelchair (measurements PT);Wheelchair cushion (measurements PT)  ?Recommendations for Other Services ? Rehab  consult  ?  ?Functional Status Assessment Patient has had a recent decline in their functional status and demonstrates the ability to make significant improvements in function in a reasonable and predictable amount of time.  ? ?  ?Precautions / Restrictions Precautions ?Precautions: Fall;Cervical ?Precaution Booklet Issued: No ?Precaution Comments: Reviewed cervical precautions, recent multi falls at home ?Required Braces or Orthoses: Other Brace ?Other Brace: no brace per MD ?Restrictions ?Weight Bearing Restrictions: No  ? ?  ? ?Mobility ? Bed Mobility ?Overal bed mobility: Needs Assistance ?Bed Mobility: Rolling, Sidelying to Sit ?Rolling: Min assist, +2 for physical assistance ?Sidelying to sit: Min assist, +2 for physical assistance ?  ?  ?  ?General bed mobility comments: Min A +2 for log roll to facilitate correct positioning of hips and trunk ?  ? ?Transfers ?Overall transfer level: Needs assistance ?Equipment used: Rolling walker (2 wheels) ?Transfers: Sit to/from Stand, Bed to chair/wheelchair/BSC ?Sit to Stand: Mod assist, +2 safety/equipment, +2 physical assistance ?Stand pivot transfers: Mod assist, +2 physical assistance, +2 safety/equipment ?  ?  ?  ?  ?General transfer comment: assist to power up and stabilize balance. Pt pulling up on RW. Therapists blocking knees due to buckling with initial stance. Pt performed lateral weight shifts in standing with RW, to ensure LE stability prior to pivot transfer to recliner. ?  ? ?Ambulation/Gait ?  ?  ?  ?  ?  ?  ?  ?General Gait Details: deferred due to bilat knee buckling, weakness ? ?Stairs ?  ?  ?  ?  ?  ? ?Wheelchair Mobility ?  ? ?Modified Rankin (Stroke Patients Only) ?  ? ?  ? ?Balance Overall balance assessment: Needs assistance ?Sitting-balance support: No upper extremity supported, Feet supported ?Sitting balance-Leahy Scale: Fair ?  ?  ?  Standing balance support: Bilateral upper extremity supported, During functional activity, Reliant on assistive  device for balance ?Standing balance-Leahy Scale: Poor ?  ?  ?  ?  ?  ?  ?  ?  ?  ?  ?  ?  ?   ? ? ? ?Pertinent Vitals/Pain Pain Assessment ?Pain Assessment: 0-10 ?Pain Score: 8  ?Pain Location: neck and shoulder ?Pain Descriptors / Indicators: Aching, Pins and needles ?Pain Intervention(s): Repositioned, Monitored during session  ? ? ?Home Living Family/patient expects to be discharged to:: Private residence ?Living Arrangements: Other relatives (grandson) ?Available Help at Discharge: Family;Available 24 hours/day ?Type of Home: House ?Home Access: Level entry ?  ?  ?  ?Home Layout: One level ?Home Equipment: BSC/3in1;Rolling Walker (2 wheels) ?   ?  ?Prior Function Prior Level of Function : Needs assist ?  ?  ?  ?Physical Assist : Mobility (physical);ADLs (physical) ?Mobility (physical): Transfers ?ADLs (physical): Bathing;Dressing ?  ?ADLs Comments: Yolanda Bonine performs all IADLs and assists with ADLs including bathing and dressing ?  ? ? ?Hand Dominance  ?   ? ?  ?Extremity/Trunk Assessment  ? Upper Extremity Assessment ?Upper Extremity Assessment: Defer to OT evaluation ?  ? ?Lower Extremity Assessment ?Lower Extremity Assessment: Generalized weakness (Pt reports numbness bilat feet.) ?  ? ?Cervical / Trunk Assessment ?Cervical / Trunk Assessment: Neck Surgery  ?Communication  ? Communication: No difficulties  ?Cognition   ?  ?  ?  ?  ?  ?  ?  ?  ?  ?  ?  ?  ?  ?  ?  ?  ?  ?  ?  ?  ?  ? ?  ?General Comments General comments (skin integrity, edema, etc.): VSS on RA throughout ? ?  ?Exercises    ? ?Assessment/Plan  ?  ?PT Assessment Patient needs continued PT services  ?PT Problem List Decreased strength;Decreased mobility;Decreased safety awareness;Decreased activity tolerance;Pain;Decreased balance;Decreased knowledge of precautions ? ?   ?  ?PT Treatment Interventions Therapeutic activities;Gait training;DME instruction;Therapeutic exercise;Patient/family education;Balance training;Functional mobility training    ? ?PT Goals (Current goals can be found in the Care Plan section)  ?Acute Rehab PT Goals ?Patient Stated Goal: home ?PT Goal Formulation: With patient ?Time For Goal Achievement: 03/11/22 ?Potential to Achieve Goals: Good ? ?  ?Frequency Min 5X/week ?  ? ? ?Co-evaluation PT/OT/SLP Co-Evaluation/Treatment: Yes ?Reason for Co-Treatment: For patient/therapist safety;To address functional/ADL transfers ?PT goals addressed during session: Mobility/safety with mobility;Balance;Proper use of DME ?OT goals addressed during session: ADL's and self-care ?  ? ? ?  ?AM-PAC PT "6 Clicks" Mobility  ?Outcome Measure Help needed turning from your back to your side while in a flat bed without using bedrails?: A Little ?Help needed moving from lying on your back to sitting on the side of a flat bed without using bedrails?: A Lot ?Help needed moving to and from a bed to a chair (including a wheelchair)?: A Lot ?Help needed standing up from a chair using your arms (e.g., wheelchair or bedside chair)?: A Lot ?Help needed to walk in hospital room?: Total ?Help needed climbing 3-5 steps with a railing? : Total ?6 Click Score: 11 ? ?  ?End of Session Equipment Utilized During Treatment: Gait belt ?Activity Tolerance: Patient tolerated treatment well ?Patient left: in chair;with call bell/phone within reach;with chair alarm set ?Nurse Communication: Mobility status ?PT Visit Diagnosis: Muscle weakness (generalized) (M62.81);Unsteadiness on feet (R26.81);Difficulty in walking, not elsewhere classified (R26.2);Pain ?  ? ?Time: [id number] ?  PT Time Calculation (min) (ACUTE ONLY): 25 min ? ? ?Charges:   PT Evaluation ?$PT Eval Moderate Complexity: 1 Mod ?  ?  ?   ? ? ?Lorrin Goodell, PT  ?Office # [phone] ?Pager [phone] ? ? ?Lorriane Shire ?02/25/2022, 11:38 AM ? ?

## 2022-02-25 NOTE — Progress Notes (Signed)
?Progress Note ?Patient: Christie Meza [id number] DOB: 05-01-1946 DOA: 02/23/2022  ?DOS: the patient was seen and examined on 02/25/2022 ? ?Brief hospital course: ?ULONDA POLIN is a 76 y.o. female with medical history significant of hypertension, morbid obesity, bipolar disorder. ?Presents to complaints of weakness and pain involving her left leg.  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 ?Neurosurgery consulted patient underwent anterior cervical decompression on 5/4. ? ?Assessment and Plan: ?* Cervical spondylitic cord compression ?Presents with complaints of left-sided weakness and bilateral hand numbness as well as recurrent fall. ?CT head unremarkable. ?Given that her symptoms have been ongoing for more than 3 weeks stroke is less likely. 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 ?MRI brain negative for any acute stroke. 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 ?Neurosurgery consulted. ?Patient under went anterior cervical decompression and C4-C5 arthrodesis and plate fixation.  On 5/4. ?Symptoms not improving.  PT OT recommends CIR although patient is refusing. ? ?Recurrent falls ?And weakness.  Without any syncopal events.  Without any head or neck injury reported by the patient's as well. ?PT OT consulted.  Recommend CIR. ? ?Bilateral knee pain ?Worsening after fall. ?Lower extremity Doppler is negative. ?X-ray of the knee shows arthritis. ?Hip x-ray showed some concern for possible femur fracture although CT pelvis ruled out presence of any fracture.  Anticipate improvement in pain with mobility. ? ?AKI (acute kidney injury) ([place]) ?No recent baseline serum creatinine available in our system.  Currently serum creatinine 1.3.  In the past serum creatinine was less than 1. ?Work-up also reveals hypercalcemia as well as hypokalemia. ?Monitor potassium  level monitor calcium level. ?Continue IV fluids. ? ?Urinary incontinence ?UTI. ?Ongoing urinary incontinence for last few weeks. ?This is associated with pain related immobility. ?Urine positive for nitrites therefore we will go ahead and treat as UTI with IV ceftriaxone for now. ? ?Thrombocytopenia ([place]) ?Seems to be acute.  Potentially secondary to dilution. ?Will monitor. ? ?Hypertension ?Blood pressure elevated. ?Patient has missed her home medications. ?Continue home regimen for now. ? ?Obesity, Class III, BMI 40-49.9 (morbid obesity) ([place]) ?Body mass index is 41.93 kg/m?Marland Kitchen  ?Placing the patient at high risk for poor outcome. ?Possibility of sleep apnea/obesity hypoventilation syndrome cannot be ruled out. ?Monitor for now. ? ?Subjective: No nausea no vomiting no fever no chills.  No chest pain abdominal pain.  Pain seems to be improving.  Numbness is improving as well. ? ?Physical Exam: ?Vitals:  ? 02/25/22 0435 02/25/22 0802 02/25/22 1156 02/25/22 1619  ?BP: (!) 151/63 126/73 134/62 (!) 118/41  ?Pulse: (!) 57 67 63 68  ?Resp: 12 18 (!) 21 16  ?Temp: (!) 97.5 ?F (36.4 ?C) 97.8 ?F (36.6 ?C) 97.8 ?F (36.6 ?C) 98 ?F (36.7 ?C)  ?TempSrc: Oral Oral Oral Oral  ?SpO2: 100% 98% 100% 99%  ?Weight:      ?Height:      ? ?General: Appear in mild distress; no visible Abnormal Neck Mass Or lumps, Conjunctiva normal ?Cardiovascular: S1 and S2 Present, no Murmur, ?Respiratory: good respiratory effort, Bilateral Air entry present and CTA, no Crackles, no wheezes ?Abdomen: Bowel Sound present, Non tender ?Extremities: no Pedal edema ?Neurology: alert and oriented to time, place, and person ?Gait not checked due to patient safety concerns  ? ?Data Reviewed: ?I have Reviewed nursing notes, Vitals, and Lab results since pt's last encounter. Pertinent lab results CBC and BMP ?  I have ordered test including BMP and CBC ?I have reviewed the last note from neurosurgery,   ? ?Family Communication: None at bedside ? ?Disposition: ?Status  is: Inpatient ?Remains inpatient appropriate because: Monitor for renal function stabilization as well as thrombocytopenia stabilization ? ?Author: ?Berle Mull, MD ?02/25/2022 7:01 PM ? ?Please look on [email or web address] to find out who is on call. ?

## 2022-02-25 NOTE — Evaluation (Signed)
Speech Language Pathology Evaluation ?Patient Details ?Name: Christie Meza ?MRN: [id number] ?DOB: [birth year] ?Today's Date: 02/25/2022 ?Time: 2130-8657 ?SLP Time Calculation (min) (ACUTE ONLY): 20 min ? ?Problem List:  ?Patient Active Problem List  ? Diagnosis Date Noted  ? 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 Bilateral knee pain 02/23/2022  ? Recurrent falls 02/23/2022  ? Bilateral hand numbness 02/23/2022  ? Urinary incontinence 02/23/2022  ? UTI (urinary tract infection) 02/23/2022  ? AKI (acute kidney injury) (HCC) 02/23/2022  ? Hypercalcemia 02/23/2022  ? Hypokalemia 02/23/2022  ? 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 Alcohol abuse   ? Chest pain 10/18/2013  ? Abnormal ECG 10/18/2013  ? Flank pain 10/18/2013  ? Obesity, Class III, BMI 40-49.9 (morbid obesity) (HCC)   ? Bipolar 1 disorder (HCC)   ? Hypertension   ? Accidental acetaminophen overdose 04/17/2012  ?  Class: Acute  ? ?Past Medical History:  ?Past Medical History:  ?Diagnosis Date  ? Bipolar 1 disorder (HCC)   ? Hypertension   ? Obesity   ? ?Past Surgical History:  ?Past Surgical History:  ?Procedure Laterality Date  ? ABDOMINAL HYSTERECTOMY  ~2002  ? 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 Pt underwent anterior cervical decompression 5/4.  PMH: HTN, morbid obesity, BiPolar disorder.  ? ?Assessment / Plan / Recommendation ?Clinical Impression ? Pt seen today for evaluation of cognitive linguistic function. Pt reports her grandson lives with her, and assists with her medications due to frequently forgetting to take them. Pt reports her daughter manages her finances. Pt has an 11th grade education.  The Mini Mental State Examination (MMSE) was administered today. She scored 23/28 (writing subtests not administered due to pt reported difficultyt with holding a pen to write), raising concern for mild cognitive  impairment. However, she recalled being given the clock drawing test in December, and said she was "perfect". Given level of education, pt may be at or near her baseline given negative MRI, however the largest concern is that of forgetting to take her medications. Recommend SLP follow up at next venue of care (hopefully CIR) to maximize functional recall for increased independence and safety. ?   ?SLP Assessment ? SLP Recommendation/Assessment: All further Speech Language Pathology needs can be addressed in the next venue of care ? ?SLP Visit Diagnosis: Cognitive communication deficit (R41.841)  ?  ?Recommendations for follow up therapy are one component of a multi-disciplinary discharge planning process, led by the attending physician.  Recommendations may be updated based on patient status, additional functional criteria and insurance authorization. ?   ?Follow Up Recommendations ? Acute inpatient rehab (3hours/day)  ?  ?Assistance Recommended at Discharge ? Intermittent Supervision/Assistance  ?Functional Status Assessment Patient has had a recent decline in their functional status and demonstrates the ability to make significant improvements in function in a reasonable and predictable amount of time.  ?   ?SLP Evaluation ?Cognition ? Overall Cognitive Status: No family/caregiver present to determine baseline cognitive functioning ?Arousal/Alertness: Awake/alert ?Orientation Level: Oriented X4 ?Year: 2023 ?Month: May ?Day of Week: Correct  ?  ?   ?Comprehension ? Auditory Comprehension ?Overall Auditory Comprehension: Appears within functional limits for tasks assessed ?Reading Comprehension ?Reading Status: Within funtional limits  ?  ?Expression Expression ?Primary Mode of Expression: Verbal ?Verbal Expression ?Overall Verbal Expression: Appears within functional limits for tasks assessed ?Initiation: No impairment ?Repetition: No impairment ?Naming: No impairment ?Written Expression ?Written  Expression: Not  tested   ?Oral / Motor ? Oral Motor/Sensory Function ?Overall Oral Motor/Sensory Function: Within functional limits ?Motor Speech ?Overall Motor Speech: Appears within functional limits for tasks assessed ?Intelligibility: Intelligible   ?        ? ?Christie Meza, MSP, CCC-SLP ?Speech Language Pathologist ?Office: [phone] ? ?Shonna Chock ?02/25/2022, 9:59 AM ? ?

## 2022-02-25 NOTE — Evaluation (Signed)
Occupational Therapy Evaluation ?Patient Details ?Name: Christie FowlerBrenda F Meza ?MRN: [id number] ?DOB: 1945-12-24 ?Today's Date: 02/25/2022 ? ? ?History of Present Illness 76 yo female presenting to ED on 5/3 with paresthesias of BLEs ongoing for 2-3 weeks after fall. 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 S/p C3-5 decompression on 5/4. PMH including hypertension, morbid obesity, bipolar disorder.  ? ?Clinical Impression ?  ?PTA, pt was living with her grandson who assisted her with bathing, dressing, and IADLs; pt using a RW at baseline. Pt currently requiring Mod-Max A for UB ADLs, Max A for LB ADLs, and Mod A +2 for functional transfer with RW. Pt reporting continues numbness in BUE and BLEs as well as presenting with decreased balance and strength impacting her safe performance of ADLs. Despite pain and fear of falling, pt is very motivated to participate in therapy. Pt would benefit from further acute OT to facilitate safe dc. Due to pt's motivation and family support, recommend dc to AIR for further OT to optimize safety, independence with ADLs, and return to PLOF.  ?   ? ?Recommendations for follow up therapy are one component of a multi-disciplinary discharge planning process, led by the attending physician.  Recommendations may be updated based on patient status, additional functional criteria and insurance authorization.  ? ?Follow Up Recommendations ? Acute inpatient rehab (3hours/day)  ?  ?Assistance Recommended at Discharge Frequent or constant Supervision/Assistance  ?Patient can return home with the following A lot of help with walking and/or transfers;A lot of help with bathing/dressing/bathroom ? ?  ?Functional Status Assessment ? Patient has had a recent decline in their functional status and demonstrates the ability to make significant improvements in function in a reasonable and  predictable amount of time.  ?Equipment Recommendations ? Tub/shower bench (Defer to next venue)  ?  ?Recommendations for Other Services PT consult;Rehab consult ? ? ?  ?Precautions / Restrictions Precautions ?Precautions: Fall;Cervical ?Precaution Booklet Issued: No ?Precaution Comments: Reviewed cervical precautions ?Required Braces or Orthoses: Other Brace ?Other Brace: no brace per MD ?Restrictions ?Weight Bearing Restrictions: No  ? ?  ? ?Mobility Bed Mobility ?Overal bed mobility: Needs Assistance ?Bed Mobility: Rolling, Sidelying to Sit ?Rolling: Min assist, +2 for physical assistance ?Sidelying to sit: Min assist, +2 for physical assistance ?  ?  ?  ?General bed mobility comments: Min A +2 for log roll to facilitate correct positioning of hips and trunk ?  ? ?Transfers ?Overall transfer level: Needs assistance ?Equipment used: Rolling walker (2 wheels) ?Transfers: Sit to/from Stand, Bed to chair/wheelchair/BSC ?Sit to Stand: Mod assist, +2 safety/equipment, +2 physical assistance ?Stand pivot transfers: Mod assist, +2 physical assistance, +2 safety/equipment ?  ?  ?  ?  ?General transfer comment: Mod A +2 for power up and weight shift with blocking of bilateral knees. Pt reporting weakness and numbness at LLE and buckling of R knee. Pt requiring Mod A +2 for pivot to recliner. ?  ? ?  ?Balance Overall balance assessment: Needs assistance ?Sitting-balance support: No upper extremity supported, Feet supported ?Sitting balance-Leahy Scale: Fair ?  ?  ?Standing balance support: Bilateral upper extremity supported, During functional activity ?Standing balance-Leahy Scale: Poor ?Standing balance comment: reliant on UE support and physical A ?  ?  ?  ?  ?  ?  ?  ?  ?  ?  ?  ?   ? ?ADL  either performed or assessed with clinical judgement  ? ?ADL Overall ADL's : Needs assistance/impaired ?Eating/Feeding: Set up;Sitting ?  ?Grooming: Set up;Sitting ?Grooming Details (indicate cue type and reason): Requiring education  on compensatory tehcniques for oral care ?Upper Body Bathing: Moderate assistance;Sitting ?  ?Lower Body Bathing: Maximal assistance;Sit to/from stand ?  ?Upper Body Dressing : Maximal assistance;Sitting ?  ?Lower Body Dressing: Maximal assistance;Sit to/from stand ?  ?Toilet Transfer: Moderate assistance;+2 for physical assistance;Stand-pivot;Rolling walker (2 wheels) (simulated to recliner) ?  ?  ?  ?  ?  ?Functional mobility during ADLs: Moderate assistance;+2 for safety/equipment;Rolling walker (2 wheels) ?General ADL Comments: Pt presenting with decreased balance, strength, and activity toelrance,. despite pain pt is very motivated  ? ? ? ?Vision   ?   ?   ?Perception   ?  ?Praxis   ?  ? ?Pertinent Vitals/Pain Pain Assessment ?Pain Assessment: 0-10 ?Pain Score: 8  ?Pain Location: neck and shoulder ?Pain Descriptors / Indicators: Aching, Pins and needles ?Pain Intervention(s): Monitored during session, Limited activity within patient's tolerance, Repositioned  ? ? ? ?Hand Dominance   ?  ?Extremity/Trunk Assessment Upper Extremity Assessment ?Upper Extremity Assessment: Generalized weakness (reports numbness in bil hands) ?  ?Lower Extremity Assessment ?Lower Extremity Assessment: Defer to PT evaluation ?  ?Cervical / Trunk Assessment ?Cervical / Trunk Assessment: Neck Surgery ?  ?Communication Communication ?Communication: No difficulties ?  ?Cognition Arousal/Alertness: Awake/alert ?Behavior During Therapy: [facility] for tasks assessed/performed ?  ?  ?  ?  ?  ?  ?  ?  ?  ?  ?  ?  ?  ?  ?  ?  ?  ?General Comments: Requiring increased time for processing. Able to follow simple commands and engaging in conversation. Very motivated despite pain and prior falls ?  ?  ?General Comments  VSS on RA throughout ? ?  ?Exercises   ?  ?Shoulder Instructions    ? ? ?Home Living Family/patient expects to be discharged to:: Private residence ?Living Arrangements: Other relatives (grandson) ?Available Help at Discharge: Family ?Type  of Home: House ?Home Access: Level entry (back door) ?  ?  ?Home Layout: One level ?  ?  ?Bathroom Shower/Tub: Tub/shower unit ?  ?Bathroom Toilet: Standard (BSC over toilet) ?  ?  ?Home Equipment: BSC/3in1;Rolling Walker (2 wheels) ?  ?  ? Lives With: Other (Comment);Family (grandson lives with pt and assists with ADLs, cooking, cleaning.) ? ?  ?Prior Functioning/Environment Prior Level of Function : Needs assist ?  ?  ?  ?Physical Assist : Mobility (physical);ADLs (physical) ?Mobility (physical): Transfers ?ADLs (physical): Bathing;Dressing ?  ?ADLs Comments: Lucila Maine performs all IADLs and assists with ADLs including bathing and dressing ?  ? ?  ?  ?OT Problem List: Decreased strength;Decreased range of motion;Decreased activity tolerance;Decreased knowledge of use of DME or AE;Decreased knowledge of precautions ?  ?   ?OT Treatment/Interventions: Self-care/ADL training;Therapeutic exercise;Energy conservation;DME and/or AE instruction;Therapeutic activities;Patient/family education  ?  ?OT Goals(Current goals can be found in the care plan section) Acute Rehab OT Goals ?Patient Stated Goal: "Get stronger so I can stop falling" ?OT Goal Formulation: With patient ?Time For Goal Achievement: 03/11/22 ?Potential to Achieve Goals: Good  ?OT Frequency: Min 2X/week ?  ? ?Co-evaluation PT/OT/SLP Co-Evaluation/Treatment: Yes ?Reason for Co-Treatment: For patient/therapist safety;To address functional/ADL transfers ?  ?OT goals addressed during session: ADL's and self-care ?  ? ?  ?AM-PAC OT "6 Clicks" Daily Activity     ?Outcome Measure Help from  another person eating meals?: None ?Help from another person taking care of personal grooming?: A Little ?Help from another person toileting, which includes using toliet, bedpan, or urinal?: A Lot ?Help from another person bathing (including washing, rinsing, drying)?: A Lot ?Help from another person to put on and taking off regular upper body clothing?: A Lot ?Help from another  person to put on and taking off regular lower body clothing?: A Lot ?6 Click Score: 15 ?  ?End of Session Equipment Utilized During Treatment: Rolling walker (2 wheels) ?Nurse Communication: Mobilit

## 2022-02-25 NOTE — Assessment & Plan Note (Addendum)
Chronically low, now acutely worsening. ?Etiology not clear. ?Acute worsening secondary to dilution. ?Will monitor. ?

## 2022-02-26 DIAGNOSIS — M4712 Other spondylosis with myelopathy, cervical region: Secondary | ICD-10-CM | POA: Diagnosis not present

## 2022-02-26 DIAGNOSIS — E538 Deficiency of other specified B group vitamins: Secondary | ICD-10-CM | POA: Diagnosis present

## 2022-02-26 LAB — CBC
HCT: 26.5 % — ABNORMAL LOW (ref 36.0–46.0)
Hemoglobin: 9.1 g/dL — ABNORMAL LOW (ref 12.0–15.0)
MCH: 27.4 pg (ref 26.0–34.0)
MCHC: 34.3 g/dL (ref 30.0–36.0)
MCV: 79.8 fL — ABNORMAL LOW (ref 80.0–100.0)
Platelets: 109 10*3/uL — ABNORMAL LOW (ref 150–400)
RBC: 3.32 MIL/uL — ABNORMAL LOW (ref 3.87–5.11)
RDW: 15.5 % (ref 11.5–15.5)
WBC: 8.3 10*3/uL (ref 4.0–10.5)
nRBC: 0 % (ref 0.0–0.2)

## 2022-02-26 LAB — BASIC METABOLIC PANEL
Anion gap: 3 — ABNORMAL LOW (ref 5–15)
BUN: 15 mg/dL (ref 8–23)
CO2: 23 mmol/L (ref 22–32)
Calcium: 10.4 mg/dL — ABNORMAL HIGH (ref 8.9–10.3)
Chloride: 113 mmol/L — ABNORMAL HIGH (ref 98–111)
Creatinine, Ser: 1.17 mg/dL — ABNORMAL HIGH (ref 0.44–1.00)
GFR, Estimated: 49 mL/min — ABNORMAL LOW (ref >60)
Glucose, Bld: 128 mg/dL — ABNORMAL HIGH (ref 70–99)
Potassium: 4.2 mmol/L (ref 3.5–5.1)
Sodium: 139 mmol/L (ref 135–145)

## 2022-02-26 LAB — VITAMIN D 25 HYDROXY (VIT D DEFICIENCY, FRACTURES): Vit D, 25-Hydroxy: 32.91 ng/mL (ref 30–100)

## 2022-02-26 LAB — TECHNOLOGIST SMEAR REVIEW: Plt Morphology: DECREASED

## 2022-02-26 LAB — HEPATIC FUNCTION PANEL
ALT: 8 U/L (ref 0–44)
AST: 15 U/L (ref 15–41)
Albumin: 2.7 g/dL — ABNORMAL LOW (ref 3.5–5.0)
Alkaline Phosphatase: 82 U/L (ref 38–126)
Bilirubin, Direct: 0.1 mg/dL (ref 0.0–0.2)
Indirect Bilirubin: 0.6 mg/dL (ref 0.3–0.9)
Total Bilirubin: 0.7 mg/dL (ref 0.3–1.2)
Total Protein: 5.6 g/dL — ABNORMAL LOW (ref 6.5–8.1)

## 2022-02-26 LAB — IRON AND TIBC
Iron: 33 ug/dL (ref 28–170)
Saturation Ratios: 11 % (ref 10.4–31.8)
TIBC: 290 ug/dL (ref 250–450)
UIBC: 257 ug/dL

## 2022-02-26 LAB — VITAMIN B12: Vitamin B-12: 197 pg/mL (ref 180–914)

## 2022-02-26 LAB — FERRITIN: Ferritin: 30 ng/mL (ref 11–307)

## 2022-02-26 LAB — FOLATE: Folate: 6.7 ng/mL (ref >5.9)

## 2022-02-26 MED ORDER — DEXAMETHASONE 4 MG PO TABS
4.0000 mg | ORAL_TABLET | Freq: Every day | ORAL | Status: AC
  Administered 2022-02-27 – 2022-03-01 (×3): 4 mg via ORAL
  Filled 2022-02-26 (×3): qty 1

## 2022-02-26 MED ORDER — CYANOCOBALAMIN 1000 MCG/ML IJ SOLN
1000.0000 ug | Freq: Every day | INTRAMUSCULAR | Status: AC
  Administered 2022-02-26 – 2022-02-28 (×3): 1000 ug via SUBCUTANEOUS
  Filled 2022-02-26 (×3): qty 1

## 2022-02-26 MED ORDER — DEXAMETHASONE 4 MG PO TABS
4.0000 mg | ORAL_TABLET | Freq: Two times a day (BID) | ORAL | Status: AC
  Administered 2022-02-26: 4 mg via ORAL
  Filled 2022-02-26: qty 1

## 2022-02-26 MED ORDER — SODIUM CHLORIDE 0.9 % IV SOLN: INTRAVENOUS | Status: DC

## 2022-02-26 NOTE — Assessment & Plan Note (Addendum)
Treated with subcutaneous B12 injections.  Switch to p.o. ?

## 2022-02-26 NOTE — Plan of Care (Signed)
?  Problem: Education: ?Goal: Knowledge of General Education information will improve ?Description: Including pain rating scale, medication(s)/side effects and non-pharmacologic comfort measures ?Outcome: Progressing ?Problem: Activity: ?Goal: Risk for activity intolerance will decrease ?Outcome: Progressing ?  ?Problem: Nutrition: ?Goal: Adequate nutrition will be maintained ?Outcome: Progressing ?  ?Problem: Coping: ?Goal: Level of anxiety will decrease ?Outcome: Progressing ?  ?Problem: Elimination: ?Goal: Will not experience complications related to bowel motility ?Outcome: Progressing ?Goal: Will not experience complications related to urinary retention ?Outcome: Progressing ?  ?Problem: Pain Managment: ?Goal: General experience of comfort will improve ?Outcome: Progressing ?  ?Problem: Safety: ?Goal: Ability to remain free from injury will improve ?Outcome: Progressing ?  ?Problem: Skin Integrity: ?Goal: Risk for impaired skin integrity will decrease ?Outcome: Progressing ?  ?  ?Problem: Clinical Measurements: ?Goal: Ability to maintain clinical measurements within normal limits will improve ?Outcome: Progressing ?Goal: Will remain free from infection ?Outcome: Progressing ?Goal: Diagnostic test results will improve ?Outcome: Progressing ?Goal: Respiratory complications will improve ?Outcome: Progressing ?Goal: Cardiovascular complication will be avoided ?Outcome: Progressing ?  ?

## 2022-02-26 NOTE — Progress Notes (Signed)
PT Cancellation Note ? ?Patient Details ?Name: Christie Meza ?MRN: [id number] ?DOB: 09/04/1946 ? ? ?Cancelled Treatment:    Reason Eval/Treat Not Completed: Other (comment). PT attempted x 2. Pt bathing with NT on first attempt. Declining on 2nd attempt due to eating lunch. PT to re-attempt as time allows. ? ? ?Ilda Foil ?02/26/2022, 12:59 PM ? ?Aida Raider, PT  ?Office # [phone] ?Pager [phone] ? ?

## 2022-02-26 NOTE — Assessment & Plan Note (Addendum)
Suspect primary hyperparathyroidism. ?Corrected calcium for albumin is 11.3. ?Vitamin D normal.  PTH significantly elevated 118. ?And PTH RP pending.  Ionized calcium elevated.  Calcitriol normal. ?Will refer outpatient for hyperparathyroidism work-up. ?

## 2022-02-26 NOTE — Progress Notes (Signed)
?Progress Note ?Patient: Christie Meza [id number] DOB: Mar 21, 1946 DOA: 02/23/2022  ?DOS: the patient was seen and examined on 02/26/2022 ? ?Brief hospital course: ?Christie Meza is a 76 y.o. female with medical history significant of hypertension, morbid obesity, bipolar disorder. ?Presents to complaints of weakness and pain involving her left leg.  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 ?Neurosurgery consulted patient underwent anterior cervical decompression on 5/4. ? ?Assessment and Plan: ?* Cervical spondylitic cord compression ?Presents with complaints of left-sided weakness and bilateral hand numbness as well as recurrent fall. ?CT head unremarkable. 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 ?MRI brain negative for any acute stroke. 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 ?Neurosurgery consulted. ?Patient underwent anterior cervical decompression and C4-C5 arthrodesis and plate fixation.  On 5/4. ?Symptoms now improving.   ?PT OT recommends CIR ?We will initiate tapering of steroids. ? ?Thrombocytopenia ([place]) ?Etiology not clear. ?Seems to be acute on chronic drop  Potentially secondary to dilution. ?Will monitor. ? ?Hypercalcemia ?Corrected calcium for albumin is 11.3. ?At present we will initiate further work-up including PTH, PTH RP, vitamin D level, ionized calcium level. ?Treat with IV fluids. ?Monitor. ? ?AKI (acute kidney injury) ([place]) ?No recent baseline serum creatinine available in our system.  Currently serum creatinine 1.3.  In the past serum creatinine was less than 1. ?Work-up also reveals hypercalcemia as well as hypokalemia. ?Continue with IV fluids. ? ?Bilateral knee pain ?Worsening after fall. ?Lower extremity Doppler is negative. ?X-ray of the knee shows arthritis. ?Hip x-ray showed some concern for possible femur fracture although  CT pelvis ruled out presence of any fracture.  Anticipate improvement in pain with mobility. ? ?Urinary incontinence ?UTI. ?Ongoing urinary incontinence for last few weeks. ?This is associated with pain related immobility. ?Urine positive for nitrites therefore we will go ahead and treat as UTI with IV ceftriaxone for now.  Switch to p.o. ? ?B12 deficiency ?We will treat with subcutaneous B12 injections.  Switch to p.o. after 3 days. ? ?Hypertension ?Blood pressure elevated. ?Patient has missed her home medications. ?Continue home regimen for now. ? ?Obesity, Class III, BMI 40-49.9 (morbid obesity) ([place]) ?Body mass index is 41.93 kg/m?Marland Kitchen  ?Placing the patient at high risk for poor outcome. ?Possibility of sleep apnea/obesity hypoventilation syndrome cannot be ruled out. ?Monitor for now. ? ?Subjective: Denies any acute complaint.  Numbness improving.  No nausea no vomiting.  Oral intake improving. ? ?Physical Exam: ?Vitals:  ? 02/26/22 0745 02/26/22 1111 02/26/22 1537 02/26/22 1926  ?BP: (!) 147/80 (!) 153/68 (!) 129/55 139/75  ?Pulse: 65 60 62 68  ?Resp: 15 18 14 15   ?Temp: 97.7 ?F (36.5 ?C) 98.2 ?F (36.8 ?C) 97.6 ?F (36.4 ?C) 97.6 ?F (36.4 ?C)  ?TempSrc: Oral Oral Oral Oral  ?SpO2: 100% 100% 100% 96%  ?Weight:      ?Height:      ? ?General: Appear in mild distress; no visible Abnormal Neck Mass Or lumps, Conjunctiva normal ?Cardiovascular: S1 and S2 Present, no Murmur, ?Respiratory: good respiratory effort, Bilateral Air entry present and CTA, no Crackles, no wheezes ?Abdomen: Bowel Sound present, Non tender  ?Extremities: no Pedal edema ?Neurology: alert and oriented to time, place, and person ?Gait not checked due to patient safety concerns  ? ?Data Reviewed: ?I have Reviewed nursing notes, Vitals, and Lab results since pt's last encounter. Pertinent lab results CBC and CMP ?I have  ordered test including CBC, B12, ferritin, iron, calcitriol, vitamin D level, PTH and PTH RP, ionized calcium, technological smear  review, LFT   ? ?Family Communication: None at bedside ? ?Disposition: ?Status is: Inpatient ?Remains inpatient appropriate because: Most likely will require CIR.  Patient currently considering. ? ?Author: ?Berle Mull, MD ?02/26/2022 8:00 PM ? ?Please look on [email or web address] to find out who is on call. ?

## 2022-02-26 NOTE — Plan of Care (Signed)

## 2022-02-26 NOTE — Progress Notes (Signed)
Inpatient Rehab Admissions: ? ?Inpatient Rehab Consult received.  I met with patient at the bedside for rehabilitation assessment and to discuss goals and expectations of an inpatient rehab admission.  Pt acknowledged understanding of CIR goals and expectations. Pt would like to discuss rehab options with daughter [name] before making a decision. Pt gave permission to contact [facility]. Call her, no one answered and unable to leave a message.  Will continue to follow. ? ? ?Signed: ?Gayland Curry, MS, CCC-SLP ?Admissions Coordinator ?[phone] ? ? ?

## 2022-02-27 DIAGNOSIS — M4712 Other spondylosis with myelopathy, cervical region: Secondary | ICD-10-CM | POA: Diagnosis not present

## 2022-02-27 LAB — RENAL FUNCTION PANEL
Albumin: 2.5 g/dL — ABNORMAL LOW (ref 3.5–5.0)
Anion gap: 5 (ref 5–15)
BUN: 16 mg/dL (ref 8–23)
CO2: 22 mmol/L (ref 22–32)
Calcium: 9.9 mg/dL (ref 8.9–10.3)
Chloride: 114 mmol/L — ABNORMAL HIGH (ref 98–111)
Creatinine, Ser: 0.95 mg/dL (ref 0.44–1.00)
GFR, Estimated: >60 mL/min (ref >60)
Glucose, Bld: 108 mg/dL — ABNORMAL HIGH (ref 70–99)
Phosphorus: 2.9 mg/dL (ref 2.5–4.6)
Potassium: 4 mmol/L (ref 3.5–5.1)
Sodium: 141 mmol/L (ref 135–145)

## 2022-02-27 LAB — CBC
HCT: 25.4 % — ABNORMAL LOW (ref 36.0–46.0)
Hemoglobin: 8.4 g/dL — ABNORMAL LOW (ref 12.0–15.0)
MCH: 26.6 pg (ref 26.0–34.0)
MCHC: 33.1 g/dL (ref 30.0–36.0)
MCV: 80.4 fL (ref 80.0–100.0)
Platelets: 97 10*3/uL — ABNORMAL LOW (ref 150–400)
RBC: 3.16 MIL/uL — ABNORMAL LOW (ref 3.87–5.11)
RDW: 15.6 % — ABNORMAL HIGH (ref 11.5–15.5)
WBC: 7.2 10*3/uL (ref 4.0–10.5)
nRBC: 0 % (ref 0.0–0.2)

## 2022-02-27 MED ORDER — FOLIC ACID 1 MG PO TABS
1.0000 mg | ORAL_TABLET | Freq: Every day | ORAL | Status: DC
  Administered 2022-02-27 – 2022-03-04 (×6): 1 mg via ORAL
  Filled 2022-02-27 (×6): qty 1

## 2022-02-27 NOTE — Plan of Care (Signed)

## 2022-02-27 NOTE — Progress Notes (Signed)
?Progress Note ?Patient: Christie Meza [id number] DOB: 04-11-1946 DOA: 02/23/2022  ?DOS: the patient was seen and examined on 02/27/2022 ? ?Brief hospital course: ?ZYKIRAH ARNT is a 76 y.o. female with medical history significant of hypertension, morbid obesity, bipolar disorder. ?Presents to complaints of weakness and pain involving her left leg.  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 ?Neurosurgery consulted patient underwent anterior cervical decompression on 5/4. ?Awaiting transfer to CIR now. ? ?Assessment and Plan: ?* Cervical spondylitic cord compression ?Presents with complaints of left-sided weakness and bilateral hand numbness as well as recurrent fall. ?CT head unremarkable. 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 ?MRI brain negative for any acute stroke. 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 ?Neurosurgery consulted. ?Patient underwent anterior cervical decompression and C4-C5 arthrodesis and plate fixation.  On 5/4. ?Symptoms now improving.   ?PT OT recommends CIR ?We will initiate tapering of steroids. ? ?Thrombocytopenia ([place]) ?Chronically low, now acutely worsening. ?Etiology not clear. ?Acute worsening secondary to dilution. ?Will monitor. ? ?Hypercalcemia ?Corrected calcium for albumin is 11.3. ?Vitamin D normal.  PTH and PTH RP pending.  INS calcium pending.  Calcitriol pending. ?We will stop the IV fluid and monitor. ? ?AKI (acute kidney injury) ([place]) ?No recent baseline serum creatinine available in our system.  Currently serum creatinine 1.3.  In the past serum creatinine was less than 1. ?Work-up also reveals hypercalcemia as well as hypokalemia. ?Continue with IV fluids. ? ?Bilateral knee pain ?Worsening after fall. ?Lower extremity Doppler is negative. ?X-ray of the knee shows arthritis. ?Hip x-ray showed some concern for  possible femur fracture although CT pelvis ruled out presence of any fracture.  Anticipate improvement in pain with mobility. ? ?Urinary incontinence ?UTI. ?Ongoing urinary incontinence for last few weeks. ?This is associated with pain related immobility. ?Urine positive for nitrites therefore we will go ahead and treat as UTI with IV ceftriaxone for now.  Last day 5/7. ? ?B12 deficiency ?We will treat with subcutaneous B12 injections.  Switch to p.o. after 3 days. ? ?Hypertension ?Blood pressure elevated. ?Patient has missed her home medications. ?Continue home regimen for now. ? ?Obesity, Class III, BMI 40-49.9 (morbid obesity) ([place]) ?Body mass index is 41.93 kg/m?Marland Kitchen  ?Placing the patient at high risk for poor outcome. ?Possibility of sleep apnea/obesity hypoventilation syndrome cannot be ruled out. ?Monitor for now. ? ?Subjective: Denies any acute complaint.  No nausea or vomiting.  Still constipated. ? ?Physical Exam: ?Vitals:  ? 02/26/22 2337 02/27/22 0335 02/27/22 0748 02/27/22 1112  ?BP: 135/63 101/73 (!) 162/79   ?Pulse: 68 60 (!) 56   ?Resp: 14 16 16 18   ?Temp: 98 ?F (36.7 ?C) 97.8 ?F (36.6 ?C) 98.3 ?F (36.8 ?C) 98.5 ?F (36.9 ?C)  ?TempSrc: Oral Oral Oral Oral  ?SpO2: 98% 96% 100%   ?Weight:      ?Height:      ? ?General: Appear in mild distress; no visible Abnormal Neck Mass Or lumps, Conjunctiva normal ?Cardiovascular: S1 and S2 Present, no Murmur, ?Respiratory: good respiratory effort, Bilateral Air entry present and CTA, no Crackles, no wheezes ?Abdomen: Bowel Sound present, Non tender  ?Extremities: no Pedal edema ?Neurology: alert and oriented to time, place, and person ?Gait not checked due to patient safety concerns  ? ?Data Reviewed: ?I have Reviewed nursing notes, Vitals, and Lab results since pt's last encounter. Pertinent lab results CBC and BMP ?I have  ordered test including CBC and BMP   ? ?Family Communication: None at bedside ? ?Disposition: ?Status is: Inpatient ?Remains inpatient  appropriate because: Close observation for thrombocytopenia.  Awaiting CIR placement. ? ?Author: ?Berle Mull, MD ?02/27/2022 5:13 PM ? ?Please look on [email or web address] to find out who is on call. ?

## 2022-02-27 NOTE — Progress Notes (Signed)
Inpatient Rehab Admissions Coordinator:  ?Spoke with pt's daughter [name] on the telephone. She acknowledged understanding of CIR goals and expectations. She is supportive of pt pursuing CIR. She confirmed that pt will have 24/7 support from family. Will continue to follow. ? ? ? ?Gayland Curry, MS, CCC-SLP ?Admissions Coordinator ?[phone] ? ?

## 2022-02-27 NOTE — PMR Pre-admission (Shared)
PMR Admission Coordinator Pre-Admission Assessment ? ?Patient: MARQUE BANGO is an 76 y.o., female ?MRN: [id number] ?DOB: Mar 10, 1946 ?Height: 5\' 5"  (165.1 cm) ?Weight: 114.3 kg ? ?Insurance Information ?HMO: yes    PPO:      PCP:      IPA:      80/20:      OTHER:  ?PRIMARY: [employer] Medicare      Policy#: [id number]      Subscriber: patient ?CM Name:       Phone#: [phone]     Fax#: [phone] Received a call from [facility] stating pt. Approved for 7 days 5/10-5/16 ?Pre-Cert#: [id number]      Employer:  ?Benefits:  Phone #: [email or web address]     Name:  ?Eff. Date: 10/24/21     Deduct: Does not have deductible      Out of Pocket Max: $3,600 ($0 met)      Life Max: NA ?CIR: $295/day co-pay for days 1-5, 100% coverage days 6+      SNF: 100% coverage for days 1-20, $196 co-pay-day for days 21-39, 100% coverage for days 40-100 ?Outpatient: $20/visit co-pay     Co-Pay:  ?Home Health: 100% coverage      Co-Pay:  ?DME: 80% coverage     Co-Pay: 20% co-insurance ?Providers: in-network ?SECONDARY:       Policy#:      Phone#:  ? ?Financial Counselor:       Phone#:  ? ?The ?Data Collection Information Summary? for patients in Inpatient Rehabilitation Facilities with attached ?Privacy Act [place] Records? was provided and verbally reviewed with: {CHL IP Patient Family FW:[id number]} ? ?Emergency Contact Information ?Contact Information   ? ? Name Relation Home Work Mobile  ? Denice Bors Daughter [phone]    ? ?  ? ? 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 On 02/23/22, pt presented to [facility] d/t weakness and falls. Pt's daughter reported acute weakness involving pt's left arm and left leg with frequent falls and difficulty walking x2-3 weeks. Pt became incontinent during the  week leading up to hospital presentation. CBC without acute findings. CT head unremarkable. CT cervical spine showed cervical spondylopathy with encroachment. MRI showed large centrally herniated disc at C3-4 with cord signal change and compression of the cord itself. A lesser spondylitic lesion with flattening of the cord is noted at C4-5; no cord signal changes noted at that level. Pt underwent C3-5 anterior cervical decompression on 02/24/22. Therapy evaluations completed and CIR recommended d/t pt's functional decline in mobility and ability to complete ADLs independently. ?Complete NIHSS TOTAL: 0 ? ?Patient's medical record from [facility] has been reviewed by the rehabilitation admission coordinator and physician. ? ?Past Medical History  ?Past Medical History:  ?Diagnosis Date  ? Bipolar 1 disorder ([place])   ? Hypertension   ? Obesity   ? ? ?Has the patient had major surgery during 100 days prior to admission? Yes ? ?Family History   ?family history includes Hypertension in her mother; Kidney disease in her brother and mother. ? ?Current Medications ? ?Current Facility-Administered Medications:  ?   stroke: early stages of recovery book, , Does not apply, Once, Kristeen Miss, MD ?  0.9 %  sodium chloride infusion, 250 mL, Intravenous, Continuous, Elsner, Henry, MD ?  acetaminophen (TYLENOL) tablet 650 mg, 650 mg, Oral, Q4H  PRN, 650 mg at 02/25/22 2049 **OR** acetaminophen (TYLENOL) 160 MG/5ML solution 650 mg, 650 mg, Per Tube, Q4H PRN **OR** acetaminophen (TYLENOL) suppository 650 mg, 650 mg, Rectal, Q4H PRN, Kristeen Miss, MD ?  alum & mag hydroxide-simeth (MAALOX/MYLANTA) 200-200-20 MG/5ML suspension 30 mL, 30 mL, Oral, Q6H PRN, Kristeen Miss, MD ?  atenolol (TENORMIN) tablet 25 mg, 25 mg, Oral, Daily, Kristeen Miss, MD, 25 mg at 02/27/22 1057 ?  bisacodyl (DULCOLAX) suppository 10 mg, 10 mg, Rectal, Daily PRN, Kristeen Miss, MD ?  cefTRIAXone (ROCEPHIN) 1 g in sodium chloride 0.9 % 100 mL IVPB, 1 g,  Intravenous, Q24H, Lavina Hamman, MD, Last Rate: 200 mL/hr at 02/26/22 1831, 1 g at 02/26/22 1831 ?  cloNIDine (CATAPRES) tablet 0.1 mg, 0.1 mg, Oral, BID, Kristeen Miss, MD, 0.1 mg at 02/27/22 1057 ?  cyanocobalamin ((VITAMIN B-12)) injection 1,000 mcg, 1,000 mcg, Subcutaneous, Q1200, Lavina Hamman, MD, 1,000 mcg at 02/27/22 1114 ?  [COMPLETED] dexamethasone (DECADRON) tablet 4 mg, 4 mg, Oral, Q12H, 4 mg at 02/26/22 2114 **FOLLOWED BY** dexamethasone (DECADRON) tablet 4 mg, 4 mg, Oral, Daily, Lavina Hamman, MD, 4 mg at 02/27/22 1057 ?  docusate sodium (COLACE) capsule 100 mg, 100 mg, Oral, BID, Kristeen Miss, MD, 100 mg at 02/27/22 1057 ?  DULoxetine (CYMBALTA) DR capsule 30 mg, 30 mg, Oral, Daily, Elsner, Henry, MD, 30 mg at 49/20/10 0712 ?  folic acid (FOLVITE) tablet 1 mg, 1 mg, Oral, Daily, Lavina Hamman, MD, 1 mg at 02/27/22 1057 ?  gabapentin (NEURONTIN) capsule 100 mg, 100 mg, Oral, TID, Kristeen Miss, MD, 100 mg at 02/27/22 1057 ?  hydrALAZINE (APRESOLINE) tablet 50 mg, 50 mg, Oral, BID, Kristeen Miss, MD, 50 mg at 02/27/22 1057 ?  HYDROcodone-acetaminophen (NORCO/VICODIN) 5-325 MG per tablet 1 tablet, 1 tablet, Oral, Q4H PRN, Kristeen Miss, MD, 1 tablet at 02/24/22 2005 ?  menthol-cetylpyridinium (CEPACOL) lozenge 3 mg, 1 lozenge, Oral, PRN **OR** phenol (CHLORASEPTIC) mouth spray 1 spray, 1 spray, Mouth/Throat, PRN, Kristeen Miss, MD ?  methocarbamol (ROBAXIN) tablet 500 mg, 500 mg, Oral, Q6H PRN, 500 mg at 02/26/22 0954 **OR** methocarbamol (ROBAXIN) 500 mg in dextrose 5 % 50 mL IVPB, 500 mg, Intravenous, Q6H PRN, Kristeen Miss, MD ?  morphine (PF) 2 MG/ML injection 2 mg, 2 mg, Intravenous, Q2H PRN, Kristeen Miss, MD ?  ondansetron (ZOFRAN) tablet 4 mg, 4 mg, Oral, Q6H PRN **OR** ondansetron (ZOFRAN) injection 4 mg, 4 mg, Intravenous, Q6H PRN, Kristeen Miss, MD ?  oxyCODONE-acetaminophen (PERCOCET/ROXICET) 5-325 MG per tablet 1-2 tablet, 1-2 tablet, Oral, Q6H PRN, Kristeen Miss, MD, 2 tablet at  02/26/22 1830 ?  pantoprazole (PROTONIX) EC tablet 40 mg, 40 mg, Oral, Daily, Kristeen Miss, MD, 40 mg at 02/27/22 1057 ?  polyethylene glycol (MIRALAX / GLYCOLAX) packet 17 g, 17 g, Oral, Daily PRN, Kristeen Miss, MD ?  pravastatin (PRAVACHOL) tablet 40 mg, 40 mg, Oral, q1800, Kristeen Miss, MD, 40 mg at 02/26/22 1700 ?  QUEtiapine (SEROQUEL) tablet 50 mg, 50 mg, Oral, QHS, Kristeen Miss, MD, 50 mg at 02/26/22 2114 ?  senna (SENOKOT) tablet 8.6 mg, 1 tablet, Oral, BID, Kristeen Miss, MD, 8.6 mg at 02/27/22 1057 ?  sodium chloride flush (NS) 0.9 % injection 3 mL, 3 mL, Intravenous, Q12H, Kristeen Miss, MD, 3 mL at 02/27/22 1058 ?  sodium chloride flush (NS) 0.9 % injection 3 mL, 3 mL, Intravenous, PRN, Kristeen Miss, MD ?  sodium phosphate (FLEET) 7-19 GM/118ML enema 1 enema, 1 enema, Rectal,  Once PRN, Kristeen Miss, MD ?  traZODone (DESYREL) tablet 100 mg, 100 mg, Oral, QHS, Kristeen Miss, MD, 100 mg at 02/26/22 2114 ? ?Patients Current Diet:  ?Diet Order   ? ?       ?  DIET SOFT Room service appropriate? Yes; Fluid consistency: Thin  Diet effective now       ?  ? ?  ?  ? ?  ? ? ?Precautions / Restrictions ?Precautions ?Precautions: Fall, Cervical ?Precaution Booklet Issued: No ?Precaution Comments: Reviewed cervical precautions, recent multi falls at home ?Other Brace: no brace per MD ?Restrictions ?Weight Bearing Restrictions: No  ? ?Has the patient had 2 or more falls or a fall with injury in the past year? Yes ? ?Prior Activity Level ?Limited Community (1-2x/wk): 1-2x/month ? ?Prior Functional Level ?Self Care: Did the patient need help bathing, dressing, using the toilet or eating? Needed some help ? ?Indoor Mobility: Did the patient need assistance with walking from room to room (with or without device)? Independent ? ?Stairs: Did the patient need assistance with internal or external stairs (with or without device)? Independent ? ?Functional Cognition: Did the patient need help planning regular tasks such as  shopping or remembering to take medications? Needed some help ? ?Patient Information ?Are you of Hispanic, Latino/a,or Spanish origin?: A. No, not of Hispanic, Latino/a, or Spanish origin ?What is your r

## 2022-02-28 DIAGNOSIS — M4712 Other spondylosis with myelopathy, cervical region: Secondary | ICD-10-CM | POA: Diagnosis not present

## 2022-02-28 LAB — PTH, INTACT AND CALCIUM
Calcium, Total (PTH): 10.3 mg/dL (ref 8.7–10.3)
PTH: 118 pg/mL — ABNORMAL HIGH (ref 15–65)

## 2022-02-28 LAB — RENAL FUNCTION PANEL
Albumin: 2.6 g/dL — ABNORMAL LOW (ref 3.5–5.0)
Anion gap: 6 (ref 5–15)
BUN: 20 mg/dL (ref 8–23)
CO2: 21 mmol/L — ABNORMAL LOW (ref 22–32)
Calcium: 10.3 mg/dL (ref 8.9–10.3)
Chloride: 112 mmol/L — ABNORMAL HIGH (ref 98–111)
Creatinine, Ser: 0.95 mg/dL (ref 0.44–1.00)
GFR, Estimated: >60 mL/min (ref >60)
Glucose, Bld: 110 mg/dL — ABNORMAL HIGH (ref 70–99)
Phosphorus: 2.7 mg/dL (ref 2.5–4.6)
Potassium: 4.2 mmol/L (ref 3.5–5.1)
Sodium: 139 mmol/L (ref 135–145)

## 2022-02-28 LAB — CALCIUM, IONIZED: Calcium, Ionized, Serum: 6.3 mg/dL — ABNORMAL HIGH (ref 4.5–5.6)

## 2022-02-28 LAB — CALCITRIOL (1,25 DI-OH VIT D): Vit D, 1,25-Dihydroxy: 68.1 pg/mL (ref 24.8–81.5)

## 2022-02-28 NOTE — Progress Notes (Signed)
Physical Therapy Treatment ?Patient Details ?Name: Christie Meza ?MRN: [id number] ?DOB: Jun 12, 1946 ?Today's Date: 02/28/2022 ? ? ?History of Present Illness 76 yo female presenting to ED on 5/3 with paresthesias of BLEs ongoing for 2-3 weeks after fall. 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 S/p C3-5 decompression on 5/4. PMH including hypertension, morbid obesity, bipolar disorder. ? ?  ?PT Comments  ? ? Pt starting to show good improvement to bil LE's.  Emphasis on general safety with transfers stressing hand placement, progression of gait stability,quality and transitions sit to sidelying.  Ending with discussion of back safety and precautionsl ?  ?Recommendations for follow up therapy are one component of a multi-disciplinary discharge planning process, led by the attending physician.  Recommendations may be updated based on patient status, additional functional criteria and insurance authorization. ? ?Follow Up Recommendations ? Acute inpatient rehab (3hours/day) ?  ?  ?Assistance Recommended at Discharge Frequent or constant Supervision/Assistance  ?Patient can return home with the following A lot of help with bathing/dressing/bathroom;Assistance with cooking/housework;Direct supervision/assist for medications management;Assist for transportation;Direct supervision/assist for financial management;A little help with walking and/or transfers ?  ?Equipment Recommendations ? Other (comment) (TBA)  ?  ?Recommendations for Other Services Rehab consult ? ? ?  ?Precautions / Restrictions Precautions ?Precautions: Fall;Cervical ?Precaution Comments: Reviewed cervical precautions, recent multi falls at home ?Other Brace: no brace per MD  ?  ? ?Mobility ? Bed Mobility ?Overal bed mobility: Needs Assistance ?Bed Mobility: Sit to Sidelying ?  ?  ?  ?  ?Sit to sidelying: Mod assist ?General bed  mobility comments: cues for sequencing and safety, assist LE's into bed with mod assist ?  ? ?Transfers ?Overall transfer level: Needs assistance ?Equipment used: Rolling walker (2 wheels) ?Transfers: Sit to/from Stand ?Sit to Stand: Min assist ?  ?  ?  ?  ?  ?General transfer comment: cues for hand placement both ascent and descent for safety with min assist ?  ? ?Ambulation/Gait ?Ambulation/Gait assistance: Min assist (consistent light to heavy min assist) ?Gait Distance (Feet): 45 Feet ?Assistive device: Rolling walker (2 wheels) ?Gait Pattern/deviations: Step-through pattern, Decreased step length - right, Decreased step length - left, Decreased stride length ?  ?Gait velocity interpretation: <1.31 ft/sec, indicative of household ambulator ?  ?General Gait Details: slow, guarded with short generally steady steps initially with light/soft buckling L LE with fatigue. ? ? ?Stairs ?  ?  ?  ?  ?  ? ? ?Wheelchair Mobility ?  ? ?Modified Rankin (Stroke Patients Only) ?  ? ? ?  ?Balance Overall balance assessment: Needs assistance ?  ?Sitting balance-Leahy Scale: Fair ?  ?  ?Standing balance support: Bilateral upper extremity supported, During functional activity, Reliant on assistive device for balance ?Standing balance-Leahy Scale: Poor ?Standing balance comment: reliant on UE support and physical A ?  ?  ?  ?  ?  ?  ?  ?  ?  ?  ?  ?  ? ?  ?Cognition Arousal/Alertness: Awake/alert ?Behavior During Therapy: [facility] for tasks assessed/performed ?Overall Cognitive Status: No family/caregiver present to determine baseline cognitive functioning (NT formally, but functional for therapy session) ?  ?  ?  ?  ?  ?  ?  ?  ?  ?  ?  ?  ?  ?  ?  ?  ?General Comments: follows instruction with increased time ?  ?  ? ?  ?  Exercises   ? ?  ?General Comments General comments (skin integrity, edema, etc.): Reinforced/advised back prec, log roll, lifting restrictions and expected progression of activity. ?  ?  ? ?Pertinent Vitals/Pain Pain  Assessment ?Pain Assessment: Faces ?Faces Pain Scale: No hurt ?Pain Intervention(s): Monitored during session  ? ? ?Home Living   ?  ?  ?  ?  ?  ?  ?  ?  ?  ?   ?  ?Prior Function    ?  ?  ?   ? ?PT Goals (current goals can now be found in the care plan section) Acute Rehab PT Goals ?Patient Stated Goal: home ?PT Goal Formulation: With patient ?Time For Goal Achievement: 03/11/22 ?Potential to Achieve Goals: Good ?Progress towards PT goals: Progressing toward goals ? ?  ?Frequency ? ? ? Min 5X/week ? ? ? ?  ?PT Plan Current plan remains appropriate  ? ? ?Co-evaluation   ?  ?  ?  ?  ? ?  ?AM-PAC PT "6 Clicks" Mobility   ?Outcome Measure ? Help needed turning from your back to your side while in a flat bed without using bedrails?: A Little ?Help needed moving from lying on your back to sitting on the side of a flat bed without using bedrails?: A Lot ?Help needed moving to and from a bed to a chair (including a wheelchair)?: A Lot ?Help needed standing up from a chair using your arms (e.g., wheelchair or bedside chair)?: A Little ?Help needed to walk in hospital room?: A Little ?Help needed climbing 3-5 steps with a railing? : Total ?6 Click Score: 14 ? ?  ?End of Session   ?Activity Tolerance: Patient tolerated treatment well ?Patient left: in bed;with call bell/phone within reach;with bed alarm set ?Nurse Communication: Mobility status ?PT Visit Diagnosis: Other abnormalities of gait and mobility (R26.89) ?  ? ? ?Time: [id number] ?PT Time Calculation (min) (ACUTE ONLY): 24 min ? ?Charges:  $Gait Training: 8-22 mins ?$Self Care/Home Management: 8-22          ?          ? ?02/28/2022 ? ?Ginger Carne., PT ?Acute Rehabilitation Services ?[phone]  (pager) ?[phone]  (office) ? ? ?Tessie Fass Zyere Jiminez ?02/28/2022, 3:40 PM ? ?

## 2022-02-28 NOTE — Progress Notes (Signed)
?Progress Note ?Patient: Christie Meza [id number] DOB: Oct 08, 1946 DOA: 02/23/2022  ?DOS: the patient was seen and examined on 02/28/2022 ? ?Brief hospital course: ?SHNIYA ANCEL is a 76 y.o. female with medical history significant of hypertension, morbid obesity, bipolar disorder. ?Presents to complaints of weakness and pain involving her left leg.  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 ?Neurosurgery consulted patient underwent anterior cervical decompression on 5/4. ?Awaiting transfer to CIR now. ?Assessment and Plan: ?* Cervical spondylitic cord compression ?Presents with complaints of left-sided weakness and bilateral hand numbness as well as recurrent fall. ?CT head unremarkable. 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 ?MRI brain negative for any acute stroke. 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 ?Neurosurgery consulted. ?Patient underwent anterior cervical decompression and C4-C5 arthrodesis and plate fixation.  On 5/4. ?Symptoms now improving.   ?PT OT recommends CIR ?Taper of the steroids.. ? ?Thrombocytopenia ([place]) ?Chronically low, now acutely worsening. ?Etiology not clear. ?Acute worsening secondary to dilution. ?Will monitor. ? ?Hypercalcemia ?Corrected calcium for albumin is 11.3. ?Vitamin D normal.  PTH significantly elevated 118. ?And PTH RP pending.  Ionized calcium elevated.  Calcitriol normal. ?Will refer outpatient for hyperparathyroidism work-up. ? ?AKI (acute kidney injury) ([place]) ?No recent baseline serum creatinine available in our system.  Currently serum creatinine 1.3.  In the past serum creatinine was less than 1. ?Work-up also reveals hypercalcemia as well as hypokalemia. ?Treated with IV fluids. ? ?Bilateral knee pain ?Worsening after fall. ?Lower extremity Doppler is negative. ?X-ray of the knee shows arthritis. ?Hip x-ray  showed some concern for possible femur fracture although CT pelvis ruled out presence of any fracture.  Anticipate improvement in pain with mobility. ? ?Urinary incontinence ?UTI. ?Ongoing urinary incontinence for last few weeks. ?This is associated with pain related immobility. ?Urine positive for nitrites therefore we will go ahead and treat as UTI with IV ceftriaxone for now.  Last day 5/7. ? ?B12 deficiency ?We will treat with subcutaneous B12 injections.  Switch to p.o. after 3 days. ? ?Hypertension ?Blood pressure elevated. ?Patient has missed her home medications. ?Continue home regimen for now. ? ?Obesity, Class III, BMI 40-49.9 (morbid obesity) ([place]) ?Body mass index is 41.93 kg/m?Marland Kitchen  ?Placing the patient at high risk for poor outcome. ?Possibility of sleep apnea/obesity hypoventilation syndrome cannot be ruled out. ?Monitor for now. ? ?Subjective: No acute complaints.  No nausea no vomiting.  Constipation resolved. ? ?Physical Exam: ?Vitals:  ? 02/28/22 1242 02/28/22 1535 02/28/22 1600 02/28/22 1800  ?BP:  138/75  (!) 153/72  ?Pulse:  65  66  ?Resp:  15  18  ?Temp:  97.8 ?F (36.6 ?C)  98 ?F (36.7 ?C)  ?TempSrc:  Oral  Oral  ?SpO2: 98% 100% 100% 98%  ?Weight:      ?Height:      ? ?General: Appear in mild distress; no visible Abnormal Neck Mass Or lumps, Conjunctiva normal ?Cardiovascular: S1 and S2 Present, no Murmur, ?Respiratory: good respiratory effort, Bilateral Air entry present and CTA, no Crackles, no wheezes ?Abdomen: Bowel Sound present, Non tender  ?Extremities: no Pedal edema ?Neurology: alert and oriented to time, place, and person ?Gait not checked due to patient safety concerns  ? ?Data Reviewed: ?I have Reviewed nursing notes, Vitals, and Lab results since pt's last encounter. Pertinent lab results CBC and BMP ?I have ordered test including CBC and BMP   ? ?Family  Communication: None at bedside ? ?Disposition: ?Status is: Inpatient ?Remains inpatient appropriate because: Awaiting transfer to  CIR. ? ?Author: ?Berle Mull, MD ?02/28/2022 7:59 PM ? ?Please look on [email or web address] to find out who is on call. ?

## 2022-02-28 NOTE — Plan of Care (Signed)
?  Problem: Education: ?Goal: Knowledge of General Education information will improve ?Description: Including pain rating scale, medication(s)/side effects and non-pharmacologic comfort measures ?Outcome: Progressing ?  ?Problem: Health Behavior/Discharge Planning: ?Goal: Ability to manage health-related needs will improve ?Outcome: Progressing ?  ?Problem: Clinical Measurements: ?Goal: Ability to maintain clinical measurements within normal limits will improve ?Outcome: Progressing ?Goal: Will remain free from infection ?Outcome: Progressing ?Goal: Diagnostic test results will improve ?Outcome: Progressing ?Goal: Respiratory complications will improve ?Outcome: Progressing ?Goal: Cardiovascular complication will be avoided ?Outcome: Progressing ?  ?Problem: Activity: ?Goal: Risk for activity intolerance will decrease ?Outcome: Progressing ?  ?Problem: Nutrition: ?Goal: Adequate nutrition will be maintained ?Outcome: Progressing ?  ?Problem: Coping: ?Goal: Level of anxiety will decrease ?Outcome: Progressing ?  ?Problem: Elimination: ?Goal: Will not experience complications related to bowel motility ?Outcome: Progressing ?Goal: Will not experience complications related to urinary retention ?Outcome: Progressing ?  ?Problem: Pain Managment: ?Goal: General experience of comfort will improve ?Outcome: Progressing ?  ?Problem: Safety: ?Goal: Ability to remain free from injury will improve ?Outcome: Progressing ?  ?Problem: Skin Integrity: ?Goal: Risk for impaired skin integrity will decrease ?Outcome: Progressing ?  ?Problem: Education: ?Goal: Knowledge of disease or condition will improve ?Outcome: Progressing ?Goal: Knowledge of secondary prevention will improve (SELECT ALL) ?Outcome: Progressing ?Goal: Knowledge of patient specific risk factors will improve (INDIVIDUALIZE FOR PATIENT) ?Outcome: Progressing ?  ?Problem: Coping: ?Goal: Will verbalize positive feelings about self ?Outcome: Progressing ?  ?Problem: Health  Behavior/Discharge Planning: ?Goal: Ability to manage health-related needs will improve ?Outcome: Progressing ?  ?

## 2022-02-28 NOTE — Progress Notes (Signed)
Occupational Therapy Treatment ?Patient Details ?Name: Christie Meza ?MRN: [id number] ?DOB: [birth year] ?Today's Date: 02/28/2022 ? ? ?History of present illness 76 yo female presenting to ED on 5/3 with paresthesias of BLEs ongoing for 2-3 weeks after fall. 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 S/p C3-5 decompression on 5/4. PMH including hypertension, morbid obesity, bipolar disorder. ?  ?OT comments ? Pt progressing towards goals this session, completes UB dressing with mod A and able to stand pivot transfer to chair with min A and use of RW. Pt able to recall 0/3 cervical precautions, reviewed with pt, pt able to verbalize understanding and adhere to precautions throughout session. Pt presenting with impairments listed below, will follow acutely. Continue to recommend AIR at d/c.  ? ?Recommendations for follow up therapy are one component of a multi-disciplinary discharge planning process, led by the attending physician.  Recommendations may be updated based on patient status, additional functional criteria and insurance authorization. ?   ?Follow Up Recommendations ? Acute inpatient rehab (3hours/day)  ?  ?Assistance Recommended at Discharge Frequent or constant Supervision/Assistance  ?Patient can return home with the following ? A lot of help with walking and/or transfers;A lot of help with bathing/dressing/bathroom ?  ?Equipment Recommendations ? None recommended by OT;Other (comment);Tub/shower bench (defer to next venue of care)  ?  ?Recommendations for Other Services PT consult;Rehab consult ? ?  ?Precautions / Restrictions Precautions ?Precautions: Fall;Cervical ?Precaution Booklet Issued: No ?Precaution Comments: Reviewed cervical precautions, recent multi falls at home ?Required Braces or Orthoses: Other Brace ?Other Brace: no brace per MD ?Restrictions ?Weight Bearing  Restrictions: No  ? ? ?  ? ?Mobility Bed Mobility ?Overal bed mobility: Needs Assistance ?Bed Mobility: Sidelying to Sit ?  ?Sidelying to sit: Mod assist ?  ?  ?  ?General bed mobility comments: for trunk elevation, bringing hips to EOB ?  ? ?Transfers ?Overall transfer level: Needs assistance ?Equipment used: Rolling walker (2 wheels) ?Transfers: Sit to/from Stand, Bed to chair/wheelchair/BSC ?Sit to Stand: Min assist ?Stand pivot transfers: Min assist ?  ?  ?  ?  ?General transfer comment: cuing for hand placement ?  ?  ?Balance Overall balance assessment: Needs assistance ?Sitting-balance support: No upper extremity supported, Feet supported ?Sitting balance-Leahy Scale: Fair ?  ?  ?Standing balance support: Bilateral upper extremity supported, During functional activity, Reliant on assistive device for balance ?Standing balance-Leahy Scale: Poor ?Standing balance comment: reliant on UE support and physical A ?  ?  ?  ?  ?  ?  ?  ?  ?  ?  ?  ?   ? ?ADL either performed or assessed with clinical judgement  ? ?ADL Overall ADL's : Needs assistance/impaired ?  ?  ?  ?  ?  ?  ?  ?  ?Upper Body Dressing : Moderate assistance;Sitting ?Upper Body Dressing Details (indicate cue type and reason): to don gown ?  ?  ?Toilet Transfer: Minimal assistance;Moderate assistance;BSC/3in1;Stand-pivot ?Toilet Transfer Details (indicate cue type and reason): simulated to chair ?  ?  ?  ?  ?Functional mobility during ADLs: Minimal assistance;Rolling walker (2 wheels) ?  ?  ? ?Extremity/Trunk Assessment Upper Extremity Assessment ?Upper Extremity Assessment: Generalized weakness (bil hand numbness) ?  ?Lower Extremity Assessment ?Lower Extremity Assessment: Defer to PT evaluation ?  ?  ?  ? ?Vision   ?Vision Assessment?: No apparent  visual deficits ?  ?Perception Perception ?Perception: Not tested ?  ?Praxis Praxis ?Praxis: Not tested ?  ? ?Cognition Arousal/Alertness: Awake/alert ?Behavior During Therapy: [facility] for tasks  assessed/performed ?Overall Cognitive Status: No family/caregiver present to determine baseline cognitive functioning ?  ?  ?  ?  ?  ?  ?  ?  ?  ?  ?  ?  ?  ?  ?  ?  ?General Comments: follows instruction with increased time ?  ?  ?   ?Exercises   ? ?  ?Shoulder Instructions   ? ? ?  ?General Comments VSS on RA  ? ? ?Pertinent Vitals/ Pain       Pain Assessment ?Pain Assessment: Faces ?Pain Score: 2  ?Pain Location: neck and shoulder ?Pain Descriptors / Indicators: Aching, Pins and needles ?Pain Intervention(s): Limited activity within patient's tolerance, Monitored during session, Repositioned ? ?Home Living   ?  ?  ?  ?  ?  ?  ?  ?  ?  ?  ?  ?  ?  ?  ?  ?  ?  ?  ? ?  ?Prior Functioning/Environment    ?  ?  ?  ?   ? ?Frequency ? Min 2X/week  ? ? ? ? ?  ?Progress Toward Goals ? ?OT Goals(current goals can now be found in the care plan section) ? Progress towards OT goals: Progressing toward goals ? ?Acute Rehab OT Goals ?Patient Stated Goal: to get stronger ?OT Goal Formulation: With patient ?Time For Goal Achievement: 03/11/22 ?Potential to Achieve Goals: Good ?ADL Goals ?Pt Will Perform Grooming: with modified independence;sitting ?Pt Will Perform Upper Body Dressing: with min assist;sitting ?Pt Will Perform Lower Body Dressing: sit to/from stand;with adaptive equipment;with caregiver independent in assisting;with min assist ?Pt Will Transfer to Toilet: with min assist;ambulating;bedside commode ?Pt Will Perform Toileting - Clothing Manipulation and hygiene: with min assist;sit to/from stand;sitting/lateral leans;with adaptive equipment ?Additional ADL Goal #1: Pt will perform bed mobility using log roll with Min Guard A in preparation for ADLs  ?Plan Discharge plan remains appropriate;Frequency remains appropriate   ? ?Co-evaluation ? ? ?   ?  ?  ?  ?  ? ?  ?AM-PAC OT "6 Clicks" Daily Activity     ?Outcome Measure ? ? Help from another person eating meals?: None ?Help from another person taking care of personal  grooming?: A Little ?Help from another person toileting, which includes using toliet, bedpan, or urinal?: A Lot ?Help from another person bathing (including washing, rinsing, drying)?: A Lot ?Help from another person to put on and taking off regular upper body clothing?: A Lot ?Help from another person to put on and taking off regular lower body clothing?: A Lot ?6 Click Score: 15 ? ?  ?End of Session Equipment Utilized During Treatment: Gait belt;Rolling walker (2 wheels) ? ?OT Visit Diagnosis: Other abnormalities of gait and mobility (R26.89);Unsteadiness on feet (R26.81);Muscle weakness (generalized) (M62.81) ?  ?Activity Tolerance Patient tolerated treatment well ?  ?Patient Left in chair;with call bell/phone within reach;with chair alarm set ?  ?Nurse Communication Mobility status ?  ? ?   ? ?Time: 8841-6606 ?OT Time Calculation (min): 17 min ? ?Charges: OT General Charges ?$OT Visit: 1 Visit ?OT Treatments ?$Self Care/Home Management : 8-22 mins ? ?Alfonzo Beers, OTD, OTR/L ?Acute Rehab ?[phone] ? ? ?Mayer Masker ?02/28/2022, 9:17 AM ?

## 2022-02-28 NOTE — Care Management Important Message (Signed)
Important Message ? ?Patient Details  ?Name: Christie Meza ?MRN: [id number] ?Date of Birth: 1946-10-24 ? ? ?Medicare Important Message Given:  Yes ? ? ? ? ?[name] ?02/28/2022, 3:06 PM ?

## 2022-02-28 NOTE — Progress Notes (Signed)
Inpatient Rehab Admissions Coordinator:  ?Saw pt at bedside. Told pt spoke with her daughter Corrie Dandy yesterday. Confirmed pt's agreement to pursue CIR. Informed pt will begin insurance authorization. Will continue to follow. ? ? ?Wolfgang Phoenix, MS, CCC-SLP ?Admissions Coordinator ?[phone] ? ?

## 2022-03-01 DIAGNOSIS — I4729 Other ventricular tachycardia: Secondary | ICD-10-CM | POA: Diagnosis not present

## 2022-03-01 DIAGNOSIS — M4712 Other spondylosis with myelopathy, cervical region: Secondary | ICD-10-CM | POA: Diagnosis not present

## 2022-03-01 LAB — RENAL FUNCTION PANEL
Albumin: 2.5 g/dL — ABNORMAL LOW (ref 3.5–5.0)
Anion gap: 7 (ref 5–15)
BUN: 20 mg/dL (ref 8–23)
CO2: 20 mmol/L — ABNORMAL LOW (ref 22–32)
Calcium: 10.1 mg/dL (ref 8.9–10.3)
Chloride: 111 mmol/L (ref 98–111)
Creatinine, Ser: 0.82 mg/dL (ref 0.44–1.00)
GFR, Estimated: >60 mL/min (ref >60)
Glucose, Bld: 140 mg/dL — ABNORMAL HIGH (ref 70–99)
Phosphorus: 3 mg/dL (ref 2.5–4.6)
Potassium: 4.1 mmol/L (ref 3.5–5.1)
Sodium: 138 mmol/L (ref 135–145)

## 2022-03-01 MED ORDER — ENOXAPARIN SODIUM 40 MG/0.4ML IJ SOSY
40.0000 mg | PREFILLED_SYRINGE | INTRAMUSCULAR | Status: DC
  Administered 2022-03-01 – 2022-03-03 (×3): 40 mg via SUBCUTANEOUS
  Filled 2022-03-01 (×3): qty 0.4

## 2022-03-01 MED ORDER — VITAMIN B-12 1000 MCG PO TABS
1000.0000 ug | ORAL_TABLET | Freq: Every day | ORAL | Status: DC
Start: 2022-03-02 — End: 2022-03-04
  Administered 2022-03-02 – 2022-03-04 (×3): 1000 ug via ORAL
  Filled 2022-03-01 (×3): qty 1

## 2022-03-01 NOTE — Assessment & Plan Note (Signed)
Short run.  Patient asymptomatic.  Electrolytes stable.  Monitor. ?

## 2022-03-01 NOTE — Progress Notes (Signed)
Inpatient Rehab Admissions Coordinator:  ? ?I do not have a CIR bed for this Pt. Today. Continue to await insurance auth. Will continue to follow for potential admit pending insurance auth.  ? ?Clemens Catholic, MS, CCC-SLP ?Rehab Admissions Coordinator  ?[phone] (celll) ?[phone] (office) ? ?  ?

## 2022-03-01 NOTE — Progress Notes (Signed)
Physical Therapy Treatment ?Patient Details ?Name: Christie Meza ?MRN: [id number] ?DOB: January 17, 1946 ?Today's Date: 03/01/2022 ? ? ?History of Present Illness 76 yo female presenting to ED on 5/3 with paresthesias of BLEs ongoing for 2-3 weeks after fall. 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 S/p C3-5 decompression on 5/4. PMH including hypertension, morbid obesity, bipolar disorder. ? ?  ?PT Comments  ? ? Pt progressing well toward goals.  Emphasis today on sit to stands and progression of gait transition as well as reinforcement of education. ?   ?Recommendations for follow up therapy are one component of a multi-disciplinary discharge planning process, led by the attending physician.  Recommendations may be updated based on patient status, additional functional criteria and insurance authorization. ? ?Follow Up Recommendations ? Acute inpatient rehab (3hours/day) ?  ?  ?Assistance Recommended at Discharge Frequent or constant Supervision/Assistance  ?Patient can return home with the following A lot of help with bathing/dressing/bathroom;Assistance with cooking/housework;Direct supervision/assist for medications management;Assist for transportation;Direct supervision/assist for financial management;A little help with walking and/or transfers ?  ?Equipment Recommendations ? Other (comment) (TBA)  ?  ?Recommendations for Other Services Rehab consult ? ? ?  ?Precautions / Restrictions Precautions ?Precautions: Fall;Cervical ?Precaution Comments: Reviewed cervical precautions, recent multi falls at home ?Other Brace: no brace per MD  ?  ? ?Mobility ? Bed Mobility ?  ?  ?  ?  ?  ?  ?  ?General bed mobility comments: OOB on arrival ?  ? ?Transfers ?Overall transfer level: Needs assistance ?Equipment used: Rolling walker (2 wheels) ?Transfers: Sit to/from Stand ?Sit to Stand: Min assist ?  ?  ?   ?  ?  ?General transfer comment: cues for hand placement both ascent and descent for safety with min assist ?  ? ?Ambulation/Gait ?Ambulation/Gait assistance: Min assist (consistent light to heavy min assist) ?Gait Distance (Feet): 90 Feet ?Assistive device: Rolling walker (2 wheels) ?Gait Pattern/deviations: Step-through pattern, Decreased step length - right, Decreased step length - left, Decreased stride length ?  ?Gait velocity interpretation: <1.31 ft/sec, indicative of household ambulator ?  ?General Gait Details: slow, guarded with short generally steady steps.  Pt needed to sit on the toilet, but unable to side step into the small bathroom using side stepping and the RW, she needed min HHA to step to the side to enter the small BR. ? ? ?Stairs ?  ?  ?  ?  ?  ? ? ?Wheelchair Mobility ?  ? ?Modified Rankin (Stroke Patients Only) ?  ? ? ?  ?Balance Overall balance assessment: Needs assistance ?Sitting-balance support: No upper extremity supported, Feet supported ?Sitting balance-Leahy Scale: Fair ?  ?  ?Standing balance support: Bilateral upper extremity supported, During functional activity, Reliant on assistive device for balance ?Standing balance-Leahy Scale: Poor ?Standing balance comment: reliant on UE support and physical A ?  ?  ?  ?  ?  ?  ?  ?  ?  ?  ?  ?  ? ?  ?Cognition Arousal/Alertness: Awake/alert ?Behavior During Therapy: [facility] for tasks assessed/performed ?Overall Cognitive Status: No family/caregiver present to determine baseline cognitive functioning (NT formally, but functional for therapy session) ?  ?  ?  ?  ?  ?  ?  ?  ?  ?  ?  ?  ?  ?  ?  ?  ?General Comments: follows  instruction with increased time ?  ?  ? ?  ?Exercises   ? ?  ?General Comments   ?  ?  ? ?Pertinent Vitals/Pain Pain Assessment ?Faces Pain Scale: No hurt  ? ? ?Home Living   ?  ?  ?  ?  ?  ?  ?  ?  ?  ?   ?  ?Prior Function    ?  ?  ?   ? ?PT Goals (current goals can now be found in the care plan section) Acute Rehab PT  Goals ?Patient Stated Goal: home ?PT Goal Formulation: With patient ?Time For Goal Achievement: 03/11/22 ?Potential to Achieve Goals: Good ?Progress towards PT goals: Progressing toward goals ? ?  ?Frequency ? ? ? Min 5X/week ? ? ? ?  ?PT Plan Current plan remains appropriate  ? ? ?Co-evaluation   ?  ?  ?  ?  ? ?  ?AM-PAC PT "6 Clicks" Mobility   ?Outcome Measure ? Help needed turning from your back to your side while in a flat bed without using bedrails?: A Little ?Help needed moving from lying on your back to sitting on the side of a flat bed without using bedrails?: A Lot ?Help needed moving to and from a bed to a chair (including a wheelchair)?: A Lot ?Help needed standing up from a chair using your arms (e.g., wheelchair or bedside chair)?: A Little ?Help needed to walk in hospital room?: A Little ?Help needed climbing 3-5 steps with a railing? : A Lot ?6 Click Score: 15 ? ?  ?End of Session   ?Activity Tolerance: Patient tolerated treatment well ?Patient left: with call bell/phone within reach;with nursing/sitter in room;Other (comment) (in the BR on the [facility]) ?Nurse Communication: Mobility status ?PT Visit Diagnosis: Other abnormalities of gait and mobility (R26.89);Muscle weakness (generalized) (M62.81) ?  ? ? ?Time: [id number] ?PT Time Calculation (min) (ACUTE ONLY): 15 min ? ?Charges:  $Gait Training: 8-22 mins          ?          ? ?03/01/2022 ? ?Ginger Carne., PT ?Acute Rehabilitation Services ?[phone]  (pager) ?[phone]  (office) ? ? ?Christie Meza ?03/01/2022, 5:50 PM ? ?

## 2022-03-01 NOTE — Progress Notes (Signed)
?Progress Note ?Patient: Christie Meza DOB: 10-30-1945 DOA: 02/23/2022  ?DOS: the patient was seen and examined on 03/01/2022 ? ?Brief hospital course: ?COLINA ZEPPIERI is a 76 y.o. female with medical history significant of hypertension, morbid obesity, bipolar disorder. ?Presents to complaints of weakness and pain involving her left leg.  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 ?Neurosurgery consulted patient underwent anterior cervical decompression on 5/4. ?Awaiting transfer to CIR now. ?Assessment and Plan: ?* Cervical spondylitic cord compression ?Presents with complaints of left-sided weakness and bilateral hand numbness as well as recurrent fall. ?CT head unremarkable. 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 ?MRI brain negative for any acute stroke. 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 ?Neurosurgery consulted. ?Patient underwent anterior cervical decompression and C4-C5 arthrodesis and plate fixation.  On 5/4. ?Symptoms now improving.   ?PT OT recommends CIR ?Taper of the steroids.. ? ?Thrombocytopenia ([place]) ?Chronically low, now acutely worsening. ?Etiology not clear. ?Acute worsening secondary to dilution. ?Will monitor. ? ?Hypercalcemia ?Suspect primary hyperparathyroidism. ?Corrected calcium for albumin is 11.3. ?Vitamin D normal.  PTH significantly elevated 118. ?And PTH RP pending.  Ionized calcium elevated.  Calcitriol normal. ?Will refer outpatient for hyperparathyroidism work-up. ? ?AKI (acute kidney injury) ([place]) ?No recent baseline serum creatinine available in our system.  Currently serum creatinine 1.3.  In the past serum creatinine was less than 1. ?Work-up also reveals hypercalcemia as well as hypokalemia. ?Treated with IV fluids. ? ?Bilateral knee pain ?Worsening after fall. ?Lower extremity Doppler is negative. ?X-ray of  the knee shows arthritis. ?Hip x-ray showed some concern for possible femur fracture although CT pelvis ruled out presence of any fracture.  Anticipate improvement in pain with mobility. ? ?Urinary incontinence ?UTI. ?Ongoing urinary incontinence for last few weeks. ?This is associated with pain related immobility. ?Urine positive for nitrites therefore we will go ahead and treat as UTI with IV ceftriaxone for now.  Last day 5/7. ? ?NSVT (nonsustained ventricular tachycardia) ([place]) ?Short run.  Patient asymptomatic.  Electrolytes stable.  Monitor. ? ?B12 deficiency ?Treated with subcutaneous B12 injections.  Switch to p.o. ? ?Hypertension ?Blood pressure was elevated on admission from missing home medication. ?Has sinus bradycardia therefore beta-blocker is currently on hold. ?Continue clonidine. ? ?Obesity, Class III, BMI 40-49.9 (morbid obesity) ([place]) ?Body mass index is 41.93 kg/m?Marland Kitchen  ?Placing the patient at high risk for poor outcome. ?Possibility of sleep apnea/obesity hypoventilation syndrome cannot be ruled out. ?Monitor for now. ? ?Subjective: No nausea no vomiting no fever no chills.  No chest pain abdominal pain.  Strength improving.  Numbness almost resolved In the hand. ? ?Physical Exam: ?Vitals:  ? 03/01/22 0331 03/01/22 0733 03/01/22 1138 03/01/22 1547  ?BP: 136/60 (!) 150/68 134/68 (!) 143/68  ?Pulse: (!) 54 (!) 52 65 61  ?Resp: 15 14 20 20   ?Temp: 98 ?F (36.7 ?C) 97.8 ?F (36.6 ?C) 97.8 ?F (36.6 ?C) 97.6 ?F (36.4 ?C)  ?TempSrc: Oral Oral Oral Oral  ?SpO2: 100% 100% 100% 100%  ?Weight:      ?Height:      ? ?General: Appear in mild distress; no visible Abnormal Neck Mass Or lumps, Conjunctiva normal ?Cardiovascular: S1 and S2 Present, no Murmur, ?Respiratory: good respiratory effort, Bilateral Air entry present and CTA, no Crackles, no wheezes ?Abdomen: Bowel Sound present, Non tender  ?Extremities: no Pedal edema ?Neurology: alert and oriented to time, place, and person ?  Gait not checked due to patient  safety concerns  ? ?Data Reviewed: ?I have Reviewed nursing notes, Vitals, and Lab results since pt's last encounter. Pertinent lab results BMP ?I have ordered test including CBC and BMP   ? ?Family Communication: None at bedside ? ?Disposition: ?Status is: Inpatient ?Remains inpatient appropriate because: Awaiting inpatient rehab placement. ?Author: ?Berle Mull, MD ?03/01/2022 9:42 PM ? ?Please look on [email or web address] to find out who is on call. ?

## 2022-03-02 LAB — CBC
HCT: 28.2 % — ABNORMAL LOW (ref 36.0–46.0)
Hemoglobin: 9.7 g/dL — ABNORMAL LOW (ref 12.0–15.0)
MCH: 27.3 pg (ref 26.0–34.0)
MCHC: 34.4 g/dL (ref 30.0–36.0)
MCV: 79.4 fL — ABNORMAL LOW (ref 80.0–100.0)
Platelets: 108 10*3/uL — ABNORMAL LOW (ref 150–400)
RBC: 3.55 MIL/uL — ABNORMAL LOW (ref 3.87–5.11)
RDW: 16 % — ABNORMAL HIGH (ref 11.5–15.5)
WBC: 8.8 10*3/uL (ref 4.0–10.5)
nRBC: 0.2 % (ref 0.0–0.2)

## 2022-03-02 LAB — BASIC METABOLIC PANEL
Anion gap: 5 (ref 5–15)
BUN: 21 mg/dL (ref 8–23)
CO2: 22 mmol/L (ref 22–32)
Calcium: 10.1 mg/dL (ref 8.9–10.3)
Chloride: 112 mmol/L — ABNORMAL HIGH (ref 98–111)
Creatinine, Ser: 1.04 mg/dL — ABNORMAL HIGH (ref 0.44–1.00)
GFR, Estimated: 56 mL/min — ABNORMAL LOW (ref >60)
Glucose, Bld: 156 mg/dL — ABNORMAL HIGH (ref 70–99)
Potassium: 4.2 mmol/L (ref 3.5–5.1)
Sodium: 139 mmol/L (ref 135–145)

## 2022-03-02 NOTE — Progress Notes (Signed)
Inpatient Rehab Admissions Coordinator:  ?  ?I do not have a CIR bed for this pt. Today. Peer to peer with insurance is pending. I will follow for potential admit pending insurance auth and bed availability. I updated pt. And she states that if denied by insurance, she prefers home with home health therapies.  ? ?Megan Salon, MS, CCC-SLP ?Rehab Admissions Coordinator  ?[phone] (celll) ?[phone] (office) ? ?

## 2022-03-02 NOTE — Plan of Care (Signed)
Max assist with adls 

## 2022-03-02 NOTE — Progress Notes (Signed)
Occupational Therapy Treatment ?Patient Details ?Name: Christie Meza ?MRN: [id number] ?DOB: 1946-04-30 ?Today's Date: 03/02/2022 ? ? ?History of present illness 76 yo female presenting to ED on 5/3 with paresthesias of BLEs ongoing for 2-3 weeks after fall. 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 S/p C3-5 decompression on 5/4. PMH including hypertension, morbid obesity, bipolar disorder. ?  ?OT comments ? Pt progressing towards goals this session, completes in room ambulation and UB bathing with set up A, max A for LB bathing. Pt more conversational during session, able to recall 2/3 precautions, verbally reviewed/reinforced with pt. Pt presenting with impairments listed below, will follow acutely. Continue to recommend AIR at d/c.  ? ?Recommendations for follow up therapy are one component of a multi-disciplinary discharge planning process, led by the attending physician.  Recommendations may be updated based on patient status, additional functional criteria and insurance authorization. ?   ?Follow Up Recommendations ? Acute inpatient rehab (3hours/day)  ?  ?Assistance Recommended at Discharge Frequent or constant Supervision/Assistance  ?Patient can return home with the following ? A lot of help with walking and/or transfers;A lot of help with bathing/dressing/bathroom ?  ?Equipment Recommendations ? Tub/shower bench  ?  ?Recommendations for Other Services PT consult;Rehab consult ? ?  ?Precautions / Restrictions Precautions ?Precautions: Fall;Cervical ?Precaution Booklet Issued: No ?Precaution Comments: verbally reviewed precautions with pt, pt able to recall 2/3 precautions ?Required Braces or Orthoses: Other Brace ?Other Brace: no brace per MD ?Restrictions ?Weight Bearing Restrictions: No  ? ? ?  ? ?Mobility Bed Mobility ?  ?  ?  ?  ?  ?  ?  ?General bed mobility comments: in chair  upon arrival ?  ? ?Transfers ?Overall transfer level: Needs assistance ?Equipment used: Rolling walker (2 wheels) ?Transfers: Sit to/from Stand ?Sit to Stand: Min assist ?  ?  ?  ?  ?  ?  ?  ?  ?Balance Overall balance assessment: Needs assistance ?Sitting-balance support: No upper extremity supported, Feet supported ?Sitting balance-Leahy Scale: Fair ?  ?  ?Standing balance support: Bilateral upper extremity supported, During functional activity, Reliant on assistive device for balance ?Standing balance-Leahy Scale: Poor ?Standing balance comment: reliant on UE support and physical A ?  ?  ?  ?  ?  ?  ?  ?  ?  ?  ?  ?   ? ?ADL either performed or assessed with clinical judgement  ? ?ADL Overall ADL's : Needs assistance/impaired ?  ?  ?  ?  ?Upper Body Bathing: Set up;Sitting ?Upper Body Bathing Details (indicate cue type and reason): applies lotion to UB ?Lower Body Bathing: Maximal assistance;Sit to/from stand;Sitting/lateral leans ?Lower Body Bathing Details (indicate cue type and reason): applying lotion to LB ?  ?  ?  ?  ?Toilet Transfer: Minimal assistance;Ambulation;Rolling walker (2 wheels) ?Toilet Transfer Details (indicate cue type and reason): simulated in room ?  ?  ?  ?  ?Functional mobility during ADLs: Minimal assistance;Rolling walker (2 wheels) ?  ?  ? ?Extremity/Trunk Assessment Upper Extremity Assessment ?Upper Extremity Assessment: Generalized weakness (bilateral hand numbness) ?  ?Lower Extremity Assessment ?Lower Extremity Assessment: Defer to PT evaluation ?  ?  ?  ? ?Vision   ?Vision Assessment?: No apparent visual deficits ?  ?Perception Perception ?Perception: Not tested ?  ?Praxis Praxis ?Praxis: Not tested ?  ? ?Cognition Arousal/Alertness: Awake/alert ?Behavior  During Therapy: [facility] for tasks assessed/performed ?Overall Cognitive Status: No family/caregiver present to determine baseline cognitive functioning ?  ?  ?  ?  ?  ?  ?  ?  ?  ?  ?  ?  ?  ?  ?  ?  ?General Comments: follows  instruction with increased time; delayed responses/initiation of tasks ?  ?  ?   ?Exercises   ? ?  ?Shoulder Instructions   ? ? ?  ?General Comments VSS on RA  ? ? ?Pertinent Vitals/ Pain       Pain Assessment ?Pain Assessment: Faces ?Pain Location: neck and shoulder ?Pain Descriptors / Indicators: Aching, Pins and needles ?Pain Intervention(s): Limited activity within patient's tolerance, Monitored during session, Repositioned ? ?Home Living   ?  ?  ?  ?  ?  ?  ?  ?  ?  ?  ?  ?  ?  ?  ?  ?  ?  ?  ? ?  ?Prior Functioning/Environment    ?  ?  ?  ?   ? ?Frequency ? Min 2X/week  ? ? ? ? ?  ?Progress Toward Goals ? ?OT Goals(current goals can now be found in the care plan section) ? Progress towards OT goals: Progressing toward goals ? ?Acute Rehab OT Goals ?Patient Stated Goal: none stated ?OT Goal Formulation: With patient ?Time For Goal Achievement: 03/11/22 ?Potential to Achieve Goals: Good ?ADL Goals ?Pt Will Perform Grooming: with modified independence;sitting ?Pt Will Perform Upper Body Dressing: with min assist;sitting ?Pt Will Perform Lower Body Dressing: sit to/from stand;with adaptive equipment;with caregiver independent in assisting;with min assist ?Pt Will Transfer to Toilet: with min assist;ambulating;bedside commode ?Pt Will Perform Toileting - Clothing Manipulation and hygiene: with min assist;sit to/from stand;sitting/lateral leans;with adaptive equipment ?Additional ADL Goal #1: Pt will perform bed mobility using log roll with Min Guard A in preparation for ADLs  ?Plan Discharge plan remains appropriate;Frequency remains appropriate   ? ?Co-evaluation ? ? ?   ?  ?  ?  ?  ? ?  ?AM-PAC OT "6 Clicks" Daily Activity     ?Outcome Measure ? ? Help from another person eating meals?: None ?Help from another person taking care of personal grooming?: A Little ?Help from another person toileting, which includes using toliet, bedpan, or urinal?: A Lot ?Help from another person bathing (including washing, rinsing,  drying)?: A Lot ?Help from another person to put on and taking off regular upper body clothing?: A Lot ?Help from another person to put on and taking off regular lower body clothing?: A Lot ?6 Click Score: 15 ? ?  ?End of Session Equipment Utilized During Treatment: Gait belt;Rolling walker (2 wheels) ? ?OT Visit Diagnosis: Other abnormalities of gait and mobility (R26.89);Unsteadiness on feet (R26.81);Muscle weakness (generalized) (M62.81) ?  ?Activity Tolerance Patient tolerated treatment well ?  ?Patient Left in chair;with call bell/phone within reach;with chair alarm set ?  ?Nurse Communication Mobility status ?  ? ?   ? ?Time: 6010-9323 ?OT Time Calculation (min): 17 min ? ?Charges: OT General Charges ?$OT Visit: 1 Visit ?OT Treatments ?$Self Care/Home Management : 8-22 mins ? ?Alfonzo Beers, OTD, OTR/L ?Acute Rehab ?[phone] ? ? ?Mayer Masker ?03/02/2022, 3:31 PM ?

## 2022-03-02 NOTE — Progress Notes (Signed)
Inpatient Rehab Admissions Coordinator:  ? ?Insurance auth received. I do not have a bed for this today.  ? ?Megan Salon, MS, CCC-SLP ?Rehab Admissions Coordinator  ?[phone] (celll) ?[phone] (office)  ?

## 2022-03-02 NOTE — Progress Notes (Signed)
Physical Therapy Treatment ?Patient Details ?Name: Christie Meza ?MRN: [id number] ?DOB: 10/03/1946 ?Today's Date: 03/02/2022 ? ? ?History of Present Illness 76 yo female presenting to ED on 5/3 with paresthesias of BLEs ongoing for 2-3 weeks after fall. 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 S/p C3-5 decompression on 5/4. PMH including hypertension, morbid obesity, bipolar disorder. ? ?  ?PT Comments  ? ? Pt has progressed well toward goals.  Emphasis today on reinforcement on all education stressing progression of activity post discharge, rolling and transition to EOB, scooting, sit to stand technique and safety, progression of ambulation with a RW ?   ?Recommendations for follow up therapy are one component of a multi-disciplinary discharge planning process, led by the attending physician.  Recommendations may be updated based on patient status, additional functional criteria and insurance authorization. ? ?Follow Up Recommendations ? Acute inpatient rehab (3hours/day) ?  ?  ?Assistance Recommended at Discharge Frequent or constant Supervision/Assistance  ?Patient can return home with the following A little help with walking and/or transfers;A little help with bathing/dressing/bathroom;Assistance with cooking/housework;Direct supervision/assist for medications management;Direct supervision/assist for financial management;Assist for transportation;Help with stairs or ramp for entrance ?  ?Equipment Recommendations ? Other (comment) (TBA)  ?  ?Recommendations for Other Services   ? ? ?  ?Precautions / Restrictions Precautions ?Precautions: Fall;Cervical ?Precaution Booklet Issued: No ?Precaution Comments: verbally reviewed precautions with pt, pt able to recall 2/3 precautions ?Required Braces or Orthoses: Other Brace ?Other Brace: no brace per MD ?Restrictions ?Weight Bearing  Restrictions: No  ?  ? ?Mobility ? Bed Mobility ?Overal bed mobility: Needs Assistance ?Bed Mobility: Rolling, Sidelying to Sit ?Rolling: Min assist ?Sidelying to sit: Mod assist ?  ?  ?  ?General bed mobility comments: Needs reinforcement with sequencing of roll and up via elbows.  Needed minimal rolling assist and mod up via R elbow. ?  ? ?Transfers ?Overall transfer level: Needs assistance ?Equipment used: Rolling walker (2 wheels) ?Transfers: Sit to/from Stand ?Sit to Stand: Min assist ?  ?  ?  ?  ?  ?General transfer comment: cues and demo for technique to stand without reliance on the RW.  minimal stability assist during coming up. ?  ? ?Ambulation/Gait ?Ambulation/Gait assistance: Min assist ?Gait Distance (Feet): 110 Feet ?Assistive device: Rolling walker (2 wheels) ?Gait Pattern/deviations: Step-through pattern, Decreased step length - right, Decreased step length - left, Decreased stride length ?  ?Gait velocity interpretation: <1.31 ft/sec, indicative of household ambulator ?  ?General Gait Details: slow, tentative steps, more shuffled as distance progressed and with fatigue.  Consistent cues for posture and proximity to the RW for safety. ? ? ?Stairs ?  ?  ?  ?  ?  ? ? ?Wheelchair Mobility ?  ? ?Modified Rankin (Stroke Patients Only) ?  ? ? ?  ?Balance   ?Sitting-balance support: No upper extremity supported, Feet supported ?Sitting balance-Leahy Scale: Fair ?  ?  ?  ?Standing balance-Leahy Scale: Poor ?Standing balance comment: reliant on UE support and physical A ?  ?  ?  ?  ?  ?  ?  ?  ?  ?  ?  ?  ? ?  ?Cognition Arousal/Alertness: Awake/alert ?Behavior During Therapy: [facility] for tasks assessed/performed ?Overall Cognitive Status: No family/caregiver present to determine baseline cognitive functioning ?  ?  ?  ?  ?  ?  ?  ?  ?  ?  ?  ?  ?  ?  ?  ?  ?  General Comments: follows instruction with increased time; delayed responses/initiation of tasks. ?  ?  ? ?  ?Exercises   ? ?  ?General Comments General  comments (skin integrity, edema, etc.): VSS on RA ?  ?  ? ?Pertinent Vitals/Pain Pain Assessment ?Pain Assessment: Faces ?Faces Pain Scale: No hurt ?Pain Intervention(s): Monitored during session  ? ? ?Home Living   ?  ?  ?  ?  ?  ?  ?  ?  ?  ?   ?  ?Prior Function    ?  ?  ?   ? ?PT Goals (current goals can now be found in the care plan section) Acute Rehab PT Goals ?PT Goal Formulation: With patient ?Time For Goal Achievement: 03/11/22 ?Potential to Achieve Goals: Good ?Progress towards PT goals: Progressing toward goals ? ?  ?Frequency ? ? ? Min 5X/week ? ? ? ?  ?PT Plan Current plan remains appropriate  ? ? ?Co-evaluation   ?  ?  ?  ?  ? ?  ?AM-PAC PT "6 Clicks" Mobility   ?Outcome Measure ? Help needed turning from your back to your side while in a flat bed without using bedrails?: A Little ?Help needed moving from lying on your back to sitting on the side of a flat bed without using bedrails?: A Lot ?Help needed moving to and from a bed to a chair (including a wheelchair)?: A Little ?Help needed standing up from a chair using your arms (e.g., wheelchair or bedside chair)?: A Little ?Help needed to walk in hospital room?: A Little ?Help needed climbing 3-5 steps with a railing? : A Lot ?6 Click Score: 16 ? ?  ?End of Session   ?Activity Tolerance: Patient tolerated treatment well ?Patient left: with call bell/phone within reach;with nursing/sitter in room;Other (comment) ?Nurse Communication: Mobility status ?PT Visit Diagnosis: Other abnormalities of gait and mobility (R26.89);Muscle weakness (generalized) (M62.81) ?Pain - part of body:  (back) ?  ? ? ?Time: 1400-1440 ?PT Time Calculation (min) (ACUTE ONLY): 40 min ? ?Charges:  $Gait Training: 8-22 mins ?$Therapeutic Activity: 8-22 mins ?$Self Care/Home Management: 8-22          ?          ? ?03/02/2022 ? ?Jacinto Halim., PT ?Acute Rehabilitation Services ?[phone]  (pager) ?[phone]  (office) ? ? ?Christie Meza ?03/02/2022, 5:08 PM ? ?

## 2022-03-02 NOTE — Progress Notes (Signed)
? Christie Meza  [id number] DOB: [birth year] DOA: 02/23/2022 ?PCP: Nolene Ebbs, MD   ? ?Brief Narrative:  ?76 year old with a history of HTN, morbid obesity, and bipolar disorder who presented to the ER with weakness and pain of the left leg. ? 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  Neurosurgery was consulted and the patient underwent an anterior cervical decompression on 5/4. ? ?Consultants:  ?Neurosurgery ? ?Code Status: FULL CODE ? ?DVT prophylaxis: ?Lovenox ? ?Interim Hx: ?Resting comfortably in bedside chair.  Has no new complaints.  Is anxious to begin rehab.  Denies chest pain or shortness of breath.  Reports good appetite. ? ?Assessment & Plan: ? ?Cervical spondylitic cord compression ?Presents with complaints of left-sided weakness and bilateral hand numbness as well as recurrent fall. ?CT head unremarkable. 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 ?MRI brain negative for any acute stroke. 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 ?Neurosurgery consulted. ?Patient underwent anterior cervical decompression and C4-C5 arthrodesis and plate fixation 5/4. ?Symptoms now improving.   ?PT OT recommends CIR ?Taper of the steroids.. ? ?Thrombocytopenia ?Chronically low -appears to be stabilizing for now ? ?Hypercalcemia ?Suspect primary hyperparathyroidism. ?Corrected calcium for albumin is 11.3. ?Vitamin D normal.  PTH significantly elevated 118. ?PTH RP pending.  Ionized calcium elevated.  Calcitriol normal. ?Will refer outpatient for hyperparathyroidism work-up. ? ?AKI  ?No recent baseline serum creatinine available in our system. In the past serum creatinine was less than 1.  Has resolved with simple volume resuscitation. ? ?Bilateral knee pain ?Worsening after fall. ?Lower extremity Doppler is  negative. ?X-ray of the knee shows arthritis. ?Hip x-ray showed some concern for possible femur fracture although CT pelvis ruled out presence of any fracture.  Anticipate improvement in pain with mobility. ? ?Urinary incontinence ?UTI. ?Ongoing urinary incontinence for last few weeks. ?This is associated with pain related immobility. ?Urine positive for nitrites therefore we will go ahead and treat as UTI with IV ceftriaxone for now.  Last day 5/7. ? ?NSVT  ?Short run.  Patient asymptomatic.  Electrolytes stable.   ? ?B12 deficiency ?Treated with subcutaneous B12 injections ? ?Hypertension ?Blood pressure was elevated on admission from missing home medication. ?Has sinus bradycardia therefore beta-blocker is currently on hold. ?Blood pressure currently well controlled. ? ?Obesity, Class III, BMI 40-49.9 (morbid obesity) ([place]) ?Body mass index is 41.93 kg/m?Marland Kitchen  ?Placing the patient at high risk for poor outcome. ?Possibility of sleep apnea/obesity hypoventilation syndrome cannot be ruled out. ?Monitor for now. ? ?Family Communication: No family present at time of exam ?Disposition: Awaiting bed availability on CIR ? ? ?Objective: ?Blood pressure 131/60, pulse (!) 56, temperature 98 ?F (36.7 ?C), temperature source Oral, resp. rate 20, height 5\' 5"  (1.651 m), weight 114.3 kg, SpO2 100 %. ?No intake or output data in the 24 hours ending 03/02/22 1122 ?Filed Weights  ? 02/23/22 0802 02/24/22 0954 02/24/22 1130  ?Weight: 113.4 kg 106.2 kg 114.3 kg  ? ? ?Examination: ?General: No acute respiratory distress ?Lungs: Clear to auscultation bilaterally without wheezes or crackles ?Cardiovascular: Regular rate and rhythm without murmur gallop or rub normal S1 and S2 ?Abdomen: Nontender, nondistended, soft, bowel sounds positive, no rebound, no ascites, no appreciable mass ?Extremities: No significant cyanosis, clubbing, or edema bilateral lower extremities ? ?CBC: ?Recent Labs  ?Lab 02/26/22 ?0209 02/27/22 ?0144 03/02/22 ?0225   ?WBC 8.3 7.2  8.8  ?HGB 9.1* 8.4* 9.7*  ?HCT 26.5* 25.4* 28.2*  ?MCV 79.8* 80.4 79.4*  ?PLT 109* 97* 108*  ? ?Basic Metabolic Panel: ?Recent Labs  ?Lab 02/27/22 ?0144 02/28/22 ?0248 03/01/22 ?0118 03/02/22 ?0225  ?NA 141 139 138 139  ?K 4.0 4.2 4.1 4.2  ?CL 114* 112* 111 112*  ?CO2 22 21* 20* 22  ?GLUCOSE 108* 110* 140* 156*  ?BUN 16 20 20 21   ?CREATININE 0.95 0.95 0.82 1.04*  ?CALCIUM 9.9 10.3 10.1 10.1  ?PHOS 2.9 2.7 3.0  --   ? ?GFR: ?Estimated Creatinine Clearance: 59.9 mL/min (A) (by C-G formula based on SCr of 1.04 mg/dL (H)). ? ?Liver Function Tests: ?Recent Labs  ?Lab 02/26/22 ?0941 02/27/22 ?0144 02/28/22 ?0248 03/01/22 ?0118  ?AST 15  --   --   --   ?ALT 8  --   --   --   ?ALKPHOS 82  --   --   --   ?BILITOT 0.7  --   --   --   ?PROT 5.6*  --   --   --   ?ALBUMIN 2.7* 2.5* 2.6* 2.5*  ? ? ?HbA1C: ?Hgb A1c MFr Bld  ?Date/Time Value Ref Range Status  ?02/23/2022 08:15 AM 5.7 (H) 4.8 - 5.6 % Final  ?  Comment:  ?  (NOTE) ?        Prediabetes: 5.7 - 6.4 ?        Diabetes: >6.4 ?        Glycemic control for adults with diabetes: <7.0 ?  ?12/04/2014 06:30 AM 5.6 4.8 - 5.6 % Final  ?  Comment:  ?  (NOTE) ?        Pre-diabetes: 5.7 - 6.4 ?        Diabetes: >6.4 ?        Glycemic control for adults with diabetes: <7.0 ?  ? ?Scheduled Meds: ? cloNIDine  0.1 mg Oral BID  ? docusate sodium  100 mg Oral BID  ? DULoxetine  30 mg Oral Daily  ? enoxaparin (LOVENOX) injection  40 mg Subcutaneous [id number]  ? folic acid  1 mg Oral Daily  ? gabapentin  100 mg Oral TID  ? hydrALAZINE  50 mg Oral BID  ? pantoprazole  40 mg Oral Daily  ? pravastatin  40 mg Oral q1800  ? QUEtiapine  50 mg Oral QHS  ? senna  1 tablet Oral BID  ? sodium chloride flush  3 mL Intravenous Q12H  ? traZODone  100 mg Oral QHS  ? vitamin B-12  1,000 mcg Oral Daily  ? ?Continuous Infusions: ? sodium chloride    ? methocarbamol (ROBAXIN) IV    ? ? ? LOS: 6 days  ? ?Cherene Altes, MD ?[facility] ?Office  [phone] ?Pager - Text Page per  Shea Evans ? ?If 7PM-7AM, please contact night-coverage per Amion ?03/02/2022, 11:22 AM ? ? ? ? ?

## 2022-03-03 MED ORDER — CLONIDINE HCL 0.1 MG PO TABS
0.1000 mg | ORAL_TABLET | Freq: Every day | ORAL | Status: DC
  Filled 2022-03-03: qty 1

## 2022-03-03 MED ORDER — ISOSORB DINITRATE-HYDRALAZINE 20-37.5 MG PO TABS
1.0000 | ORAL_TABLET | Freq: Three times a day (TID) | ORAL | Status: DC
  Administered 2022-03-03 – 2022-03-04 (×3): 1 via ORAL
  Filled 2022-03-03 (×3): qty 1

## 2022-03-03 MED ORDER — OXYCODONE-ACETAMINOPHEN 5-325 MG PO TABS: 1.0000 | ORAL_TABLET | Freq: Four times a day (QID) | ORAL | Status: DC | PRN

## 2022-03-03 MED ORDER — FLUCONAZOLE 150 MG PO TABS
150.0000 mg | ORAL_TABLET | Freq: Once | ORAL | Status: AC
  Administered 2022-03-03: 150 mg via ORAL
  Filled 2022-03-03: qty 1

## 2022-03-03 NOTE — Progress Notes (Signed)
? Christie Meza  [id number] DOB: [birth year] DOA: 02/23/2022 ?PCP: Nolene Ebbs, MD   ? ?Brief Narrative:  ?76yo with a history of HTN, morbid obesity, and bipolar disorder who presented to the ER with weakness and pain of the left leg. ? 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  Neurosurgery was consulted and the patient underwent an anterior cervical decompression on 5/4. ? ?Consultants:  ?Neurosurgery ? ?Code Status: FULL CODE ? ?DVT prophylaxis: ?Lovenox ? ?Interim Hx: ?Afebrile.  Vitals are stable.  Is complaining of some vaginal irritation and slight discharge which she said is consistent with prior yeast infections.  Denies nausea and vomiting.  Reports good appetite. ? ?Assessment & Plan: ? ?Cervical spondylitic cord compression ?Presents with complaints of left-sided weakness and bilateral hand numbness as well as recurrent fall. ?CT head unremarkable. 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 ?MRI brain negative for any acute stroke. 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 ?Neurosurgery consulted. ?Patient underwent anterior cervical decompression and C4-C5 arthrodesis and plate fixation 5/4. ?Symptoms now improving.   ?PT OT recommend CIR but no bed available and pt has requested SNF rehab instead.  ? ?Thrombocytopenia ?Chronically low -appears to be stabilizing for now ? ?Hypercalcemia ?Suspect primary hyperparathyroidism. ?Corrected calcium for albumin is 11.3. ?Vitamin D normal.  PTH significantly elevated 118. ?PTH RP pending.  Ionized calcium elevated.  Calcitriol normal. ?Will refer outpatient for hyperparathyroidism work-up. ? ?AKI  ?No recent baseline serum creatinine available in our system. In the past serum creatinine was less than 1.  Has resolved with simple volume  resuscitation. ? ?Bilateral knee pain ?Worsening after fall. ?Lower extremity Doppler is negative. ?X-ray of the knee shows arthritis. ?Hip x-ray showed some concern for possible femur fracture although CT pelvis ruled out presence of any fracture.  Anticipate improvement in pain with mobility. ? ?Urinary incontinence - UTI ?Ongoing urinary incontinence for last few weeks associated with immobility. ?Urine positive for nitrites therefore was tx as UTI with ceftriaxone. ? ?NSVT  ?Short run.  Patient asymptomatic.  Electrolytes stable.   ? ?B12 deficiency ?B12 level was quite low at 197 - treated with subcutaneous B12 injections -continue oral replacement and recommend recheck of B12 level in 8-12 weeks ? ?Hypertension ?Usual beta-blocker discontinued due to sinus bradycardia -blood pressure control not yet ideal therefore medication being titrated ? ?Obesity, Class III, BMI 40-49.9 (morbid obesity) ?Body mass index is 41.93 kg/m?Marland Kitchen  ?Placing the patient at high risk for poor outcome. ?Possibility of sleep apnea/obesity hypoventilation syndrome cannot be ruled out. ?Monitor for now. ? ?Family Communication: No family present at time of exam ?Disposition: Awaiting bed availability for SNF rehab ? ? ?Objective: ?Blood pressure (!) 141/71, pulse (!) 58, temperature (!) 97.5 ?F (36.4 ?C), temperature source Oral, resp. rate 18, height 5\' 5"  (1.651 m), weight 114.3 kg, SpO2 100 %. ? ?Intake/Output Summary (Last 24 hours) at 03/03/2022 0936 ?Last data filed at 03/03/2022 [id number] ?Gross per 24 hour  ?Intake 240 ml  ?Output 1750 ml  ?Net -1510 ml  ? ?Filed Weights  ? 02/23/22 0802 02/24/22 0954 02/24/22 1130  ?Weight: 113.4 kg 106.2 kg 114.3 kg  ? ? ?Examination: ?General: No acute respiratory distress ?Lungs: CTA B without wheeze ?Cardiovascular: RRR without murmur or rub ?Abdomen: NT/ND, soft, BS positive ?Extremities: Trace bilateral lower extremity edema ? ?CBC: ?Recent Labs  ?Lab 02/26/22 ?  [id number] 02/27/22 ?0144 03/02/22 ?0225   ?WBC 8.3 7.2 8.8  ?HGB 9.1* 8.4* 9.7*  ?HCT 26.5* 25.4* 28.2*  ?MCV 79.8* 80.4 79.4*  ?PLT 109* 97* 108*  ? ? ?Basic Metabolic Panel: ?Recent Labs  ?Lab 02/27/22 ?0144 02/28/22 ?0248 03/01/22 ?0118 03/02/22 ?0225  ?NA 141 139 138 139  ?K 4.0 4.2 4.1 4.2  ?CL 114* 112* 111 112*  ?CO2 22 21* 20* 22  ?GLUCOSE 108* 110* 140* 156*  ?BUN 16 20 20 21   ?CREATININE 0.95 0.95 0.82 1.04*  ?CALCIUM 9.9 10.3 10.1 10.1  ?PHOS 2.9 2.7 3.0  --   ? ? ?GFR: ?Estimated Creatinine Clearance: 59.9 mL/min (A) (by C-G formula based on SCr of 1.04 mg/dL (H)). ? ?Scheduled Meds: ? cloNIDine  0.1 mg Oral BID  ? docusate sodium  100 mg Oral BID  ? DULoxetine  30 mg Oral Daily  ? enoxaparin (LOVENOX) injection  40 mg Subcutaneous [id number]  ? folic acid  1 mg Oral Daily  ? gabapentin  100 mg Oral TID  ? hydrALAZINE  50 mg Oral BID  ? pantoprazole  40 mg Oral Daily  ? pravastatin  40 mg Oral q1800  ? QUEtiapine  50 mg Oral QHS  ? senna  1 tablet Oral BID  ? traZODone  100 mg Oral QHS  ? vitamin B-12  1,000 mcg Oral Daily  ? ? ? LOS: 7 days  ? ?Cherene Altes, MD ?[facility] ?Office  [phone] ?Pager - Text Page per Shea Evans ? ?If 7PM-7AM, please contact night-coverage per Amion ?03/03/2022, 9:36 AM ? ? ? ? ?

## 2022-03-03 NOTE — Progress Notes (Signed)
Re: Christie Meza ?DOB:02-26-1946 ?Date:03/03/2022 ? ?To Whom It May Concern: ? ?Please be advised that the above-named patient will require a short-term nursing home stay--anticipated 30 days or less for rehabilitation and strengthening. The plan is for home.  ?

## 2022-03-03 NOTE — [place] FL2 (Signed)
?[place] MEDICAID FL2 LEVEL OF CARE SCREENING TOOL  ?  ? ?IDENTIFICATION  ?Patient Name: ?Christie Meza Birthdate: 13-Jun-1946 Sex: female Admission Date (Current Location): ?02/23/2022  ?[place] Number: ? [name] ?  Facility and Address:  ?[facility], [address], [place], [place] [address] ?     Provider Number: ?[id number]  ?Attending Physician Name and Address:  ?Lonia Blood, MD ? Relative Name and Phone Number:  ?  ?   ?Current Level of Care: ?Hospital Recommended Level of Care: ?Skilled Nursing Facility Prior Approval Number: ?  ? ?Date Approved/Denied: ?  PASRR Number: ?under review ? ?Discharge Plan: ?SNF ?  ? ?Current Diagnoses: ?Patient Active Problem List  ? Diagnosis Date Noted  ? NSVT (nonsustained ventricular tachycardia) (HCC) 03/01/2022  ? B12 deficiency 02/26/2022  ? Thrombocytopenia (HCC) 02/25/2022  ? 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 Bilateral knee pain 02/23/2022  ? Recurrent falls 02/23/2022  ? Bilateral hand numbness 02/23/2022  ? Urinary incontinence 02/23/2022  ? UTI (urinary tract infection) 02/23/2022  ? AKI (acute kidney injury) (HCC) 02/23/2022  ? Hypercalcemia 02/23/2022  ? Hypokalemia 02/23/2022  ? 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 Alcohol abuse   ? Chest pain 10/18/2013  ? Abnormal ECG 10/18/2013  ? Flank pain 10/18/2013  ? Obesity, Class III, BMI 40-49.9 (morbid obesity) (HCC)   ? Bipolar 1 disorder (HCC)   ? Hypertension   ? Accidental acetaminophen overdose 04/17/2012  ? ? ?Orientation RESPIRATION BLADDER Height & Weight   ?  ?Self, Time, Situation, Place ? Normal Continent Weight: 114.3 kg ?Height:  5\' 5"  (165.1 cm)  ?BEHAVIORAL SYMPTOMS/MOOD NEUROLOGICAL BOWEL NUTRITION STATUS  ?    Continent Diet (heart healthy with thin liquids)  ?AMBULATORY STATUS COMMUNICATION OF NEEDS Skin   ?Extensive Assist Verbally Surgical wounds ?  ?  ?  ?    ?     ?     ? ? ?Personal Care Assistance Level of Assistance   ?Bathing, Feeding, Dressing Bathing Assistance: Maximum assistance ?Feeding assistance: Independent ?Dressing Assistance: Maximum assistance ?   ? ?Functional Limitations Info  ?Sight, Hearing, Speech Sight Info: Adequate ?Hearing Info: Adequate ?Speech Info: Adequate  ? ? ?SPECIAL CARE FACTORS FREQUENCY  ?PT (By licensed PT), OT (By licensed OT)   ?  ?PT Frequency: 5x/wk ?OT Frequency: 5x/wk ?  ?  ?  ?   ? ? ?Contractures Contractures Info: Not present  ? ? ?Additional Factors Info  ?Code Status, Allergies, Psychotropic Code Status Info: Full ?Allergies Info: Aspirin ?Psychotropic Info: Neurontin 100 mg TID/ Seroquel 50 mg at bedtime/ Trazadone 100 mg at bedtime ?  ?  ?   ? ?Current Medications (03/03/2022):  This is the current hospital active medication list ?Current Facility-Administered Medications  ?Medication Dose Route Frequency Provider Last Rate Last Admin  ? acetaminophen (TYLENOL) tablet 650 mg  650 mg Oral Q4H PRN 05/03/2022, MD   650 mg at 02/25/22 2049  ? alum & mag hydroxide-simeth (MAALOX/MYLANTA) 200-200-20 MG/5ML suspension 30 mL  30 mL Oral Q6H PRN 05-02-2001, MD      ? bisacodyl (DULCOLAX) suppository 10 mg  10 mg Rectal Daily PRN Barnett Abu, MD      ? Barnett Abu ON 03/04/2022] cloNIDine (CATAPRES) tablet 0.1 mg  0.1 mg Oral Daily 05/04/2022, MD      ? docusate sodium (COLACE) capsule 100 mg  100 mg Oral BID Lonia Blood, MD   100  mg at 03/03/22 1610  ? DULoxetine (CYMBALTA) DR capsule 30 mg  30 mg Oral Daily Barnett Abu, MD   30 mg at 03/03/22 9604  ? enoxaparin (LOVENOX) injection 40 mg  40 mg Subcutaneous Q24H Rolly Salter, MD   40 mg at 03/02/22 1736  ? folic acid (FOLVITE) tablet 1 mg  1 mg Oral Daily Rolly Salter, MD   1 mg at 03/03/22 5409  ? gabapentin (NEURONTIN) capsule 100 mg  100 mg Oral TID Barnett Abu, MD   100 mg at 03/03/22 8119  ? HYDROcodone-acetaminophen (NORCO/VICODIN) 5-325 MG per tablet 1 tablet  1 tablet Oral Q4H PRN Barnett Abu, MD   1 tablet at  02/28/22 1819  ? isosorbide-hydrALAZINE (BIDIL) 20-37.5 MG per tablet 1 tablet  1 tablet Oral TID Lonia Blood, MD      ? menthol-cetylpyridinium (CEPACOL) lozenge 3 mg  1 lozenge Oral PRN Barnett Abu, MD      ? Or  ? phenol (CHLORASEPTIC) mouth spray 1 spray  1 spray Mouth/Throat PRN Barnett Abu, MD      ? methocarbamol (ROBAXIN) tablet 500 mg  500 mg Oral Q6H PRN Barnett Abu, MD   500 mg at 02/26/22 0954  ? Or  ? methocarbamol (ROBAXIN) 500 mg in dextrose 5 % 50 mL IVPB  500 mg Intravenous Q6H PRN Barnett Abu, MD      ? morphine (PF) 2 MG/ML injection 2 mg  2 mg Intravenous Q2H PRN Barnett Abu, MD      ? ondansetron (ZOFRAN) tablet 4 mg  4 mg Oral Q6H PRN Barnett Abu, MD      ? Or  ? ondansetron (ZOFRAN) injection 4 mg  4 mg Intravenous Q6H PRN Barnett Abu, MD      ? oxyCODONE-acetaminophen (PERCOCET/ROXICET) 5-325 MG per tablet 1-2 tablet  1-2 tablet Oral Q6H PRN Barnett Abu, MD   2 tablet at 03/02/22 1478  ? pantoprazole (PROTONIX) EC tablet 40 mg  40 mg Oral Daily Barnett Abu, MD   40 mg at 03/03/22 2956  ? polyethylene glycol (MIRALAX / GLYCOLAX) packet 17 g  17 g Oral Daily PRN Barnett Abu, MD      ? pravastatin (PRAVACHOL) tablet 40 mg  40 mg Oral q1800 Barnett Abu, MD   40 mg at 03/02/22 1735  ? QUEtiapine (SEROQUEL) tablet 50 mg  50 mg Oral Claris Gladden, MD   50 mg at 03/02/22 2117  ? senna (SENOKOT) tablet 8.6 mg  1 tablet Oral BID Barnett Abu, MD   8.6 mg at 03/03/22 2130  ? sodium phosphate (FLEET) 7-19 GM/118ML enema 1 enema  1 enema Rectal Once PRN Barnett Abu, MD      ? traZODone (DESYREL) tablet 100 mg  100 mg Oral QHS Barnett Abu, MD   100 mg at 03/02/22 2117  ? vitamin B-12 (CYANOCOBALAMIN) tablet 1,000 mcg  1,000 mcg Oral Daily Rolly Salter, MD   1,000 mcg at 03/03/22 8657  ? ? ? ?Discharge Medications: ?Please see discharge summary for a list of discharge medications. ? ?Relevant Imaging Results: ? ?Relevant Lab Results: ? ? ?Additional Information ?SS#:  [id number] ? ?Kermit Balo, RN ? ? ? ? ?

## 2022-03-03 NOTE — Progress Notes (Signed)
?  Inpatient Rehabilitation Admissions Coordinator  ? ?I do not have a CIR bed to admit her to today. I met with patient at bedside to  discuss the need to look at other rehab venues for unlikely to have a bed this week. TOC made aware. ? ?Danne Baxter, RN, MSN ?Rehab Admissions Coordinator ?[phone] ?03/03/2022 11:46 AM ? ?

## 2022-03-03 NOTE — TOC Progression Note (Signed)
Transition of Care (TOC) - Progression Note  ? ? ?Patient Details  ?Name: Christie Meza ?MRN: [id number] ?Date of Birth: 13-May-1946 ? ?Transition of Care (TOC) CM/SW Contact  ?Pollie Friar, RN ?Phone Number: ?03/03/2022, 12:18 PM ? ?Clinical Narrative:    ?CIR has insurance approval but will not have a bed for her this week most likely.. CM met with the patient to offer the other IR in the area: [facility] and [facility]. She feels these are too far and has elected to d/c to a SNF. She was in agreement to being faxed out in the [facility] area. Pts PASAR under review. CM will send the requested information to  Must.  ?TOC following. ? ? ?Expected Discharge Plan: [place] ?Barriers to Discharge: Continued Medical Work up ? ?Expected Discharge Plan and Services ?Expected Discharge Plan: [place] ?In-house Referral: Clinical Social Work ?Discharge Planning Services: CM Consult ?Post Acute Care Choice: [place] ?Living arrangements for the past 2 months: [place] ?                ?  ?  ?  ?  ?  ?  ?  ?  ?  ?  ? ? ?Social Determinants of Health (SDOH) Interventions ?  ? ?Readmission Risk Interventions ?   ? View : No data to display.  ?  ?  ?  ? ? ?

## 2022-03-03 NOTE — Progress Notes (Signed)
PT Cancellation Note ? ?Patient Details ?Name: Christie Meza ?MRN: [id number] ?DOB: Sep 29, 1946 ? ? ?Cancelled Treatment:    Reason Eval/Treat Not Completed: Other (comment).  Declining PT and reports she just doesn't feel like getting OOB.  Will retry as time and pt allow. ? ? ?Ramond Dial ?03/03/2022, 11:52 AM ? ?Mee Hives, PT PhD ?Acute Rehab Dept. Number: [facility] [phone] and [place] [phone] ? ?

## 2022-03-04 LAB — BASIC METABOLIC PANEL
Anion gap: 4 — ABNORMAL LOW (ref 5–15)
BUN: 18 mg/dL (ref 8–23)
CO2: 24 mmol/L (ref 22–32)
Calcium: 9.7 mg/dL (ref 8.9–10.3)
Chloride: 109 mmol/L (ref 98–111)
Creatinine, Ser: 0.97 mg/dL (ref 0.44–1.00)
GFR, Estimated: >60 mL/min (ref >60)
Glucose, Bld: 118 mg/dL — ABNORMAL HIGH (ref 70–99)
Potassium: 4.4 mmol/L (ref 3.5–5.1)
Sodium: 137 mmol/L (ref 135–145)

## 2022-03-04 LAB — CBC
HCT: 25.4 % — ABNORMAL LOW (ref 36.0–46.0)
Hemoglobin: 8.6 g/dL — ABNORMAL LOW (ref 12.0–15.0)
MCH: 27.7 pg (ref 26.0–34.0)
MCHC: 33.9 g/dL (ref 30.0–36.0)
MCV: 81.9 fL (ref 80.0–100.0)
Platelets: 112 10*3/uL — ABNORMAL LOW (ref 150–400)
RBC: 3.1 MIL/uL — ABNORMAL LOW (ref 3.87–5.11)
RDW: 16.9 % — ABNORMAL HIGH (ref 11.5–15.5)
WBC: 8 10*3/uL (ref 4.0–10.5)
nRBC: 0 % (ref 0.0–0.2)

## 2022-03-04 LAB — MAGNESIUM: Magnesium: 1.9 mg/dL (ref 1.7–2.4)

## 2022-03-04 LAB — PTH-RELATED PEPTIDE: PTH-related peptide: <2 pmol/L

## 2022-03-04 MED ORDER — HYDROCODONE-ACETAMINOPHEN 5-325 MG PO TABS: 1.0000 | ORAL_TABLET | ORAL | 0 refills | Status: DC | PRN

## 2022-03-04 MED ORDER — METHOCARBAMOL 500 MG PO TABS: 500.0000 mg | ORAL_TABLET | Freq: Four times a day (QID) | ORAL | 0 refills | Status: DC | PRN

## 2022-03-04 MED ORDER — DOCUSATE SODIUM 100 MG PO CAPS: 100.0000 mg | ORAL_CAPSULE | Freq: Two times a day (BID) | ORAL | 0 refills | Status: AC

## 2022-03-04 MED ORDER — BISACODYL 10 MG RE SUPP: 10.0000 mg | Freq: Every day | RECTAL | 0 refills | Status: DC | PRN

## 2022-03-04 MED ORDER — ACETAMINOPHEN 325 MG PO TABS: 650.0000 mg | ORAL_TABLET | ORAL | Status: AC | PRN

## 2022-03-04 MED ORDER — ISOSORB DINITRATE-HYDRALAZINE 20-37.5 MG PO TABS: 1.0000 | ORAL_TABLET | Freq: Three times a day (TID) | ORAL | Status: DC

## 2022-03-04 MED ORDER — CYANOCOBALAMIN 1000 MCG PO TABS: 1000.0000 ug | ORAL_TABLET | Freq: Every day | ORAL | Status: DC

## 2022-03-04 MED ORDER — SENNA 8.6 MG PO TABS: 1.0000 | ORAL_TABLET | Freq: Two times a day (BID) | ORAL | 0 refills | Status: DC

## 2022-03-04 NOTE — TOC Transition Note (Signed)
Transition of Care (TOC) - CM/SW Discharge Note ? ? ?Patient Details  ?Name: Christie Meza ?MRN: [id number] ?Date of Birth: 1946/06/20 ? ?Transition of Care (TOC) CM/SW Contact:  ?Kermit Balo, RN ?Phone Number: ?03/04/2022, 8:50 AM ? ? ?Clinical Narrative:    ?Approved 5/12 - 5/16, next review 5/16.  Reference ID: [id number].  Plan Auth: [id number].  ? ?Pt discharging to [facility] today. [facility] to provide transport. Daughter aware and bedside RN updated.  ? ?Number for report: [phone] ?Room: 126B ? ? ?Final next level of care: Skilled Nursing Facility ?Barriers to Discharge: No Barriers Identified ? ? ?Patient Goals and CMS Choice ?  ?CMS Medicare.gov Compare Post Acute Care list provided to:: Patient ?Choice offered to / list presented to : Patient, Adult Children ? ?Discharge Placement ?PASRR number recieved: 03/03/22 ?           ?Patient chooses bed at: [facility] ?Patient to be transferred to facility by: [facility] ?Name of family member notified: daughter--[name] ?Patient and family notified of of transfer: 03/04/22 ? ?Discharge Plan and Services ?In-house Referral: Clinical Social Work ?Discharge Planning Services: CM Consult ?Post Acute Care Choice: Skilled Nursing Facility          ?  ?  ?  ?  ?  ?  ?  ?  ?  ?  ? ?Social Determinants of Health (SDOH) Interventions ?  ? ? ?Readmission Risk Interventions ?   ? View : No data to display.  ?  ?  ?  ? ? ? ? ? ?

## 2022-03-04 NOTE — Progress Notes (Signed)
PT Cancellation Note ? ?Patient Details ?Name: Christie Meza ?MRN: [id number] ?DOB: 09-01-1946 ? ? ?Cancelled Treatment:    Reason Eval/Treat Not Completed: Patient declined, no reason specified. Pt declining PT this AM. Plan for d/c to SNF today. ? ? ?Ilda Foil ?03/04/2022, 11:27 AM ? ?Aida Raider, PT  ?Office # [phone] ?Pager [phone] ? ?

## 2022-03-04 NOTE — Discharge Summary (Signed)
?DISCHARGE SUMMARY ? ?Francis Gaines Lekas ? ?MR#: [id number] ? ?DOB:11-Oct-1946  ?Date of Admission: 02/23/2022 ?Date of Discharge: 03/04/2022 ? ?Attending Physician:Taja Pentland Hennie Duos, MD ? ?Patient's [id number], Christean Grief, MD ? ?Consults: Neurosurgery ? ?Disposition: D/C to SNF for rehab stay  ? ?Follow-up Appts: ? Follow-up Information   ? ? Nolene Ebbs, MD. Schedule an appointment as soon as possible for a visit.   ?Specialty: Internal Medicine ?Why: Arrange for an appointment 1 week after you are released from your rehab facility . ?Contact information: ?[address] ?[place] [address] ?[phone] ? ? ?  ?  ? ?  ?  ? ?  ? ? ?Tests Needing Follow-up: ?-recheck B12 level in 8-12 weeks to determine if she is absorbing it via oral dosing ?-f/u on PTH RP which is pending at time of d/c - consider further evaluation for possible hyperparathyroidism ?-Monitor blood pressure with probable need to titrate medication further ? ?Discharge Diagnoses: ?Cervical spondylitic cord compression ?Thrombocytopenia ?Hypercalcemia ?AKI  ?Bilateral knee pain ?Urinary incontinence - UTI ?NSVT  ?B12 deficiency ?Hypertension ?Obesity, Class III, BMI 40-49.9 (morbid obesity) ? ?Initial presentation: ?76yo with a history of HTN, morbid obesity, and bipolar disorder who presented to the ER with weakness and pain of the left leg. ? 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  Neurosurgery was consulted and the patient underwent an anterior cervical decompression on 5/4. ? ?Cervical spondylitic cord compression ?Presented with complaints of left-sided weakness and bilateral hand numbness as well as recurrent fall. ?CT head unremarkable. ?CT C-spine noted cervical spondylopathy with encroachment. ?MRI brain negative for any acute stroke. ?MRI C-spine noted evidence of high-grade stenosis at C3-C4 with cord compression ?MRI lumbar spine noted evidence of foraminal  stenosis. ?Neurosurgery consulted. ?Patient underwent anterior cervical decompression and C4-C5 arthrodesis and plate fixation 5/4. ?Symptoms now improving.   ?PT/OT recommend CIR but no bed available and pt requested SNF rehab instead.  ?  ?Thrombocytopenia ?Chronically low - platelet count slowly improving ?  ?Hypercalcemia ?Suspect primary hyperparathyroidism. ?Corrected calcium for albumin is 11.3. ?Vitamin D normal.  PTH significantly elevated at 118. ?PTH RP still pending.  Ionized calcium elevated.  Calcitriol normal. ?Will refer outpatient for hyperparathyroidism work-up. ?  ?AKI  ?No recent baseline serum creatinine available in our system. In the past serum creatinine was less than 1.  Has resolved with simple volume resuscitation. ?  ?Bilateral knee pain ?Worsened after fall. ?Lower extremity Doppler was negative. ?X-ray of the knee noted arthritis. ?Hip x-ray showed some concern for possible femur fracture, but f/u CT pelvis ruled out presence of fracture.  Anticipate improvement in pain with mobility. ?  ?Urinary incontinence - UTI ?urinary incontinence for last few weeks before admission associated with immobility - urine positive for nitrites therefore was tx as UTI with ceftriaxone ?  ?NSVT  ?Short run.  Patient asymptomatic.  Electrolytes stable. No further w/u indicated at present.  ?  ?B12 deficiency ?B12 level was quite low at 197 - treated with subcutaneous B12 injections - continue oral replacement at time of discharge and recommend recheck of B12 level in 8-12 weeks ?  ?Hypertension ?Usual beta-blocker discontinued due to sinus bradycardia -blood pressure control not yet ideal therefore medication being titrated at time of discharge -monitor blood pressure with possible need to further adjust medications as outpatient ?  ?Obesity, Class III, BMI 40-49.9 (morbid obesity) ?Body mass index is 41.93 kg/m?Marland Kitchen  ?Placing the patient at high risk for poor outcome. ?  Possibility of sleep apnea/obesity  hypoventilation syndrome cannot be ruled out. ?Monitor for now. ? ? ?Allergies as of 03/04/2022   ? ?   Reactions  ? Aspirin Hives  ? ?  ? ?  ?Medication List  ?  ? ?STOP taking these medications   ? ?aspirin EC 81 MG tablet ?  ?atenolol 100 MG tablet ?Commonly known as: TENORMIN ?  ?cloNIDine 0.1 MG tablet ?Commonly known as: CATAPRES ?  ?hydrALAZINE 100 MG tablet ?Commonly known as: APRESOLINE ?  ?SINUS & ALLERGY PO ?  ? ?  ? ?TAKE these medications   ? ?acetaminophen 325 MG tablet ?Commonly known as: TYLENOL ?Take 2 tablets (650 mg total) by mouth every 4 (four) hours as needed for mild pain (or temp > 37.5 C (99.5 F)). ?What changed:  ?medication strength ?how much to take ?when to take this ?reasons to take this ?  ?bisacodyl 10 MG suppository ?Commonly known as: DULCOLAX ?Place 1 suppository (10 mg total) rectally daily as needed for moderate constipation. ?  ?cyanocobalamin 1000 MCG tablet ?Take 1 tablet (1,000 mcg total) by mouth daily. ?Start taking on: Mar 05, 2022 ?  ?docusate sodium 100 MG capsule ?Commonly known as: COLACE ?Take 1 capsule (100 mg total) by mouth 2 (two) times daily. ?  ?DULoxetine 30 MG capsule ?Commonly known as: CYMBALTA ?Take 30 mg by mouth daily. ?  ?gabapentin 100 MG capsule ?Commonly known as: NEURONTIN ?Take 100 mg by mouth 3 (three) times daily. ?  ?HYDROcodone-acetaminophen 5-325 MG tablet ?Commonly known as: NORCO/VICODIN ?Take 1 tablet by mouth every 4 (four) hours as needed for moderate pain. ?  ?isosorbide-hydrALAZINE 20-37.5 MG tablet ?Commonly known as: BIDIL ?Take 1 tablet by mouth 3 (three) times daily. ?  ?methocarbamol 500 MG tablet ?Commonly known as: ROBAXIN ?Take 1 tablet (500 mg total) by mouth every 6 (six) hours as needed for muscle spasms. ?  ?omeprazole 20 MG capsule ?Commonly known as: PRILOSEC ?Take 20 mg by mouth daily. ?  ?pravastatin 40 MG tablet ?Commonly known as: PRAVACHOL ?Take 40 mg by mouth daily. ?  ?QUEtiapine 50 MG tablet ?Commonly known as:  SEROQUEL ?Take 50 mg by mouth at bedtime. ?  ?senna 8.6 MG Tabs tablet ?Commonly known as: SENOKOT ?Take 1 tablet (8.6 mg total) by mouth 2 (two) times daily. ?  ?traZODone 100 MG tablet ?Commonly known as: DESYREL ?Take 100 mg by mouth at bedtime. ?  ? ?  ? ? ?Day of Discharge ?BP (!) 154/61 (BP Location: Left Arm)   Pulse 76   Temp 97.8 ?F (36.6 ?C) (Oral)   Resp 16   Ht 5\' 5"  (1.651 m)   Wt 114.3 kg   SpO2 98%   BMI 41.93 kg/m?  ? ?Physical Exam: ?General: No acute respiratory distress ?Lungs: Clear to auscultation bilaterally without wheezes or crackles ?Cardiovascular: Regular rate and rhythm without murmur gallop or rub normal S1 and S2 ?Abdomen: Nontender, nondistended, soft, bowel sounds positive, no rebound, no ascites, no appreciable mass ?Extremities: No significant cyanosis, clubbing, or edema bilateral lower extremities ? ?Basic Metabolic Panel: ?Recent Labs  ?Lab 02/27/22 ?0144 02/28/22 ?0248 03/01/22 ?0118 03/02/22 ?0225 03/04/22 ?[id number]  ?NA 141 139 138 139 137  ?K 4.0 4.2 4.1 4.2 4.4  ?CL 114* 112* 111 112* 109  ?CO2 22 21* 20* 22 24  ?GLUCOSE 108* 110* 140* 156* 118*  ?BUN 16 20 20 21 18   ?CREATININE 0.95 0.95 0.82 1.04* 0.97  ?CALCIUM 9.9 10.3 10.1 10.1 9.7  ?  MG  --   --   --   --  1.9  ?PHOS 2.9 2.7 3.0  --   --   ? ?Liver Function Tests: ?Recent Labs  ?Lab 02/26/22 ?0941 02/27/22 ?0144 02/28/22 ?0248 03/01/22 ?0118  ?AST 15  --   --   --   ?ALT 8  --   --   --   ?ALKPHOS 82  --   --   --   ?BILITOT 0.7  --   --   --   ?PROT 5.6*  --   --   --   ?ALBUMIN 2.7* 2.5* 2.6* 2.5*  ? ?CBC: ?Recent Labs  ?Lab 02/26/22 ?0209 02/27/22 ?0144 03/02/22 ?0225 03/04/22 ?[id number]  ?WBC 8.3 7.2 8.8 8.0  ?HGB 9.1* 8.4* 9.7* 8.6*  ?HCT 26.5* 25.4* 28.2* 25.4*  ?MCV 79.8* 80.4 79.4* 81.9  ?PLT 109* 97* 108* 112*  ? ? ?Time spent in discharge (includes decision making & examination of pt): ?35 minutes ? ?03/04/2022, 9:46 AM  ? ?Cherene Altes, MD ?[facility] ?Office  [phone] ? ? ? ? ? ?

## 2022-04-28 ENCOUNTER — Other Ambulatory Visit: Payer: Self-pay | Admitting: Neurological Surgery

## 2022-04-28 DIAGNOSIS — G959 Disease of spinal cord, unspecified: Secondary | ICD-10-CM

## 2022-05-22 ENCOUNTER — Ambulatory Visit
Admission: RE | Admit: 2022-05-22 | Discharge: 2022-05-22 | Disposition: A | Discharge status: Home / Self Care | Payer: Medicare Other | Source: Ambulatory Visit | Attending: Neurological Surgery | Admitting: Neurological Surgery

## 2022-05-22 DIAGNOSIS — G959 Disease of spinal cord, unspecified: Secondary | ICD-10-CM

## 2023-05-02 ENCOUNTER — Other Ambulatory Visit: Payer: Self-pay | Admitting: Internal Medicine

## 2023-05-04 LAB — LIPID PANEL
Cholesterol: 198 mg/dL (ref <200)
HDL: 69 mg/dL (ref >=50)
LDL Cholesterol (Calc): 107 mg/dL (calc) — ABNORMAL HIGH
Non-HDL Cholesterol (Calc): 129 mg/dL (calc) (ref <130)
Total CHOL/HDL Ratio: 2.9 (calc) (ref <5.0)
Triglycerides: 119 mg/dL (ref <150)

## 2023-05-04 LAB — CBC
HCT: 31.5 % — ABNORMAL LOW (ref 35.0–45.0)
Hemoglobin: 10 g/dL — ABNORMAL LOW (ref 11.7–15.5)
MCH: 24.9 pg — ABNORMAL LOW (ref 27.0–33.0)
MCHC: 31.7 g/dL — ABNORMAL LOW (ref 32.0–36.0)
MCV: 78.6 fL — ABNORMAL LOW (ref 80.0–100.0)
MPV: 11.4 fL (ref 7.5–12.5)
Platelets: 217 10*3/uL (ref 140–400)
RBC: 4.01 10*6/uL (ref 3.80–5.10)
RDW: 16.9 % — ABNORMAL HIGH (ref 11.0–15.0)
WBC: 8.3 10*3/uL (ref 3.8–10.8)

## 2023-05-04 LAB — COMPLETE METABOLIC PANEL WITH GFR
AG Ratio: 1.3 (calc) (ref 1.0–2.5)
ALT: 8 U/L (ref 6–29)
AST: 14 U/L (ref 10–35)
Albumin: 3.9 g/dL (ref 3.6–5.1)
Alkaline phosphatase (APISO): 150 U/L (ref 37–153)
BUN/Creatinine Ratio: 15 (calc) (ref 6–22)
BUN: 19 mg/dL (ref 7–25)
CO2: 25 mmol/L (ref 20–32)
Calcium: 11.1 mg/dL — ABNORMAL HIGH (ref 8.6–10.4)
Chloride: 107 mmol/L (ref 98–110)
Creat: 1.23 mg/dL — ABNORMAL HIGH (ref 0.60–1.00)
Globulin: 3 g/dL (calc) (ref 1.9–3.7)
Glucose, Bld: 83 mg/dL (ref 65–99)
Potassium: 4.5 mmol/L (ref 3.5–5.3)
Sodium: 142 mmol/L (ref 135–146)
Total Bilirubin: 0.3 mg/dL (ref 0.2–1.2)
Total Protein: 6.9 g/dL (ref 6.1–8.1)
eGFR: 46 mL/min/{1.73_m2} — ABNORMAL LOW (ref >=60)

## 2023-05-04 LAB — URINE CULTURE
MICRO NUMBER:: 15176717
SPECIMEN QUALITY:: ADEQUATE

## 2023-05-04 LAB — TSH: TSH: 2.4 mIU/L (ref 0.40–4.50)

## 2024-07-27 ENCOUNTER — Emergency Department (HOSPITAL_COMMUNITY)

## 2024-07-27 ENCOUNTER — Inpatient Hospital Stay (HOSPITAL_COMMUNITY)
Admission: EM | Admit: 2024-07-27 | Discharge: 2024-08-01 | DRG: 065 | Disposition: A | Discharge status: Rehabilitation Facility | Attending: Family Medicine | Admitting: Family Medicine

## 2024-07-27 DIAGNOSIS — E785 Hyperlipidemia, unspecified: Secondary | ICD-10-CM | POA: Diagnosis present

## 2024-07-27 DIAGNOSIS — Z741 Need for assistance with personal care: Secondary | ICD-10-CM | POA: Diagnosis present

## 2024-07-27 DIAGNOSIS — E66812 Obesity, class 2: Secondary | ICD-10-CM | POA: Diagnosis present

## 2024-07-27 DIAGNOSIS — D509 Iron deficiency anemia, unspecified: Secondary | ICD-10-CM | POA: Diagnosis present

## 2024-07-27 DIAGNOSIS — I10 Essential (primary) hypertension: Secondary | ICD-10-CM | POA: Diagnosis present

## 2024-07-27 DIAGNOSIS — I639 Cerebral infarction, unspecified: Secondary | ICD-10-CM | POA: Diagnosis not present

## 2024-07-27 DIAGNOSIS — I6389 Other cerebral infarction: Secondary | ICD-10-CM | POA: Diagnosis not present

## 2024-07-27 DIAGNOSIS — Z886 Allergy status to analgesic agent status: Secondary | ICD-10-CM | POA: Diagnosis not present

## 2024-07-27 DIAGNOSIS — I69391 Dysphagia following cerebral infarction: Secondary | ICD-10-CM | POA: Diagnosis not present

## 2024-07-27 DIAGNOSIS — I63422 Cerebral infarction due to embolism of left anterior cerebral artery: Secondary | ICD-10-CM

## 2024-07-27 DIAGNOSIS — R471 Dysarthria and anarthria: Secondary | ICD-10-CM | POA: Diagnosis present

## 2024-07-27 DIAGNOSIS — R4701 Aphasia: Secondary | ICD-10-CM | POA: Diagnosis present

## 2024-07-27 DIAGNOSIS — F319 Bipolar disorder, unspecified: Secondary | ICD-10-CM | POA: Diagnosis present

## 2024-07-27 DIAGNOSIS — G936 Cerebral edema: Secondary | ICD-10-CM | POA: Diagnosis not present

## 2024-07-27 DIAGNOSIS — G459 Transient cerebral ischemic attack, unspecified: Secondary | ICD-10-CM | POA: Diagnosis not present

## 2024-07-27 DIAGNOSIS — G894 Chronic pain syndrome: Secondary | ICD-10-CM | POA: Diagnosis present

## 2024-07-27 DIAGNOSIS — R32 Unspecified urinary incontinence: Secondary | ICD-10-CM | POA: Diagnosis present

## 2024-07-27 DIAGNOSIS — R29704 NIHSS score 4: Secondary | ICD-10-CM | POA: Diagnosis not present

## 2024-07-27 DIAGNOSIS — K219 Gastro-esophageal reflux disease without esophagitis: Secondary | ICD-10-CM | POA: Diagnosis not present

## 2024-07-27 DIAGNOSIS — E876 Hypokalemia: Secondary | ICD-10-CM | POA: Diagnosis present

## 2024-07-27 DIAGNOSIS — G8314 Monoplegia of lower limb affecting left nondominant side: Secondary | ICD-10-CM | POA: Diagnosis present

## 2024-07-27 DIAGNOSIS — Z91199 Patient's noncompliance with other medical treatment and regimen due to unspecified reason: Secondary | ICD-10-CM | POA: Diagnosis not present

## 2024-07-27 DIAGNOSIS — F1010 Alcohol abuse, uncomplicated: Secondary | ICD-10-CM | POA: Diagnosis present

## 2024-07-27 DIAGNOSIS — Z79899 Other long term (current) drug therapy: Secondary | ICD-10-CM | POA: Diagnosis not present

## 2024-07-27 DIAGNOSIS — R296 Repeated falls: Secondary | ICD-10-CM | POA: Diagnosis present

## 2024-07-27 DIAGNOSIS — R29810 Facial weakness: Secondary | ICD-10-CM | POA: Diagnosis present

## 2024-07-27 DIAGNOSIS — G8191 Hemiplegia, unspecified affecting right dominant side: Secondary | ICD-10-CM | POA: Diagnosis present

## 2024-07-27 DIAGNOSIS — K5901 Slow transit constipation: Secondary | ICD-10-CM | POA: Diagnosis not present

## 2024-07-27 DIAGNOSIS — R29712 NIHSS score 12: Secondary | ICD-10-CM | POA: Diagnosis present

## 2024-07-27 DIAGNOSIS — E66813 Obesity, class 3: Secondary | ICD-10-CM | POA: Diagnosis present

## 2024-07-27 DIAGNOSIS — N179 Acute kidney failure, unspecified: Secondary | ICD-10-CM | POA: Diagnosis present

## 2024-07-27 DIAGNOSIS — Z8249 Family history of ischemic heart disease and other diseases of the circulatory system: Secondary | ICD-10-CM

## 2024-07-27 DIAGNOSIS — I63512 Cerebral infarction due to unspecified occlusion or stenosis of left middle cerebral artery: Secondary | ICD-10-CM | POA: Diagnosis not present

## 2024-07-27 DIAGNOSIS — R531 Weakness: Secondary | ICD-10-CM | POA: Diagnosis not present

## 2024-07-27 DIAGNOSIS — Z6838 Body mass index (BMI) 38.0-38.9, adult: Secondary | ICD-10-CM | POA: Diagnosis not present

## 2024-07-27 DIAGNOSIS — R569 Unspecified convulsions: Secondary | ICD-10-CM | POA: Diagnosis not present

## 2024-07-27 LAB — DIFFERENTIAL
Abs Immature Granulocytes: 0.02 K/uL (ref 0.00–0.07)
Basophils Absolute: 0 K/uL (ref 0.0–0.1)
Basophils Relative: 1 %
Eosinophils Absolute: 0.2 K/uL (ref 0.0–0.5)
Eosinophils Relative: 3 %
Immature Granulocytes: 0 %
Lymphocytes Relative: 27 %
Lymphs Abs: 1.4 K/uL (ref 0.7–4.0)
Monocytes Absolute: 0.5 K/uL (ref 0.1–1.0)
Monocytes Relative: 9 %
Neutro Abs: 3.2 K/uL (ref 1.7–7.7)
Neutrophils Relative %: 60 %

## 2024-07-27 LAB — COMPREHENSIVE METABOLIC PANEL WITH GFR
ALT: 11 U/L (ref 0–44)
AST: 22 U/L (ref 15–41)
Albumin: 3.6 g/dL (ref 3.5–5.0)
Alkaline Phosphatase: 149 U/L — ABNORMAL HIGH (ref 38–126)
Anion gap: 7 (ref 5–15)
BUN: 13 mg/dL (ref 8–23)
CO2: 22 mmol/L (ref 22–32)
Calcium: 10.7 mg/dL — ABNORMAL HIGH (ref 8.9–10.3)
Chloride: 111 mmol/L (ref 98–111)
Creatinine, Ser: 1.54 mg/dL — ABNORMAL HIGH (ref 0.44–1.00)
GFR, Estimated: 35 mL/min — ABNORMAL LOW (ref >60)
Glucose, Bld: 104 mg/dL — ABNORMAL HIGH (ref 70–99)
Potassium: 3.9 mmol/L (ref 3.5–5.1)
Sodium: 140 mmol/L (ref 135–145)
Total Bilirubin: 0.7 mg/dL (ref 0.0–1.2)
Total Protein: 6.7 g/dL (ref 6.5–8.1)

## 2024-07-27 LAB — CBC
HCT: 25.5 % — ABNORMAL LOW (ref 36.0–46.0)
Hemoglobin: 8.1 g/dL — ABNORMAL LOW (ref 12.0–15.0)
MCH: 23.8 pg — ABNORMAL LOW (ref 26.0–34.0)
MCHC: 31.8 g/dL (ref 30.0–36.0)
MCV: 75 fL — ABNORMAL LOW (ref 80.0–100.0)
Platelets: 194 K/uL (ref 150–400)
RBC: 3.4 MIL/uL — ABNORMAL LOW (ref 3.87–5.11)
RDW: 16 % — ABNORMAL HIGH (ref 11.5–15.5)
WBC: 5.3 K/uL (ref 4.0–10.5)
nRBC: 0 % (ref 0.0–0.2)

## 2024-07-27 LAB — PROTIME-INR
INR: 1 (ref 0.8–1.2)
Prothrombin Time: 14 s (ref 11.4–15.2)

## 2024-07-27 LAB — ETHANOL: Alcohol, Ethyl (B): <15 mg/dL (ref <15)

## 2024-07-27 LAB — I-STAT CHEM 8, ED
BUN: 13 mg/dL (ref 8–23)
Calcium, Ion: 1.38 mmol/L (ref 1.15–1.40)
Chloride: 110 mmol/L (ref 98–111)
Creatinine, Ser: 1.6 mg/dL — ABNORMAL HIGH (ref 0.44–1.00)
Glucose, Bld: 103 mg/dL — ABNORMAL HIGH (ref 70–99)
HCT: 25 % — ABNORMAL LOW (ref 36.0–46.0)
Hemoglobin: 8.5 g/dL — ABNORMAL LOW (ref 12.0–15.0)
Potassium: 4 mmol/L (ref 3.5–5.1)
Sodium: 143 mmol/L (ref 135–145)
TCO2: 23 mmol/L (ref 22–32)

## 2024-07-27 LAB — CBG MONITORING, ED: Glucose-Capillary: 106 mg/dL — ABNORMAL HIGH (ref 70–99)

## 2024-07-27 LAB — APTT: aPTT: 30 s (ref 24–36)

## 2024-07-27 MED ORDER — ATORVASTATIN CALCIUM 40 MG PO TABS
40.0000 mg | ORAL_TABLET | Freq: Every day | ORAL | Status: DC
  Administered 2024-07-28 – 2024-08-01 (×5): 40 mg via ORAL
  Filled 2024-07-27 (×5): qty 1

## 2024-07-27 MED ORDER — CLOPIDOGREL BISULFATE 300 MG PO TABS
300.0000 mg | ORAL_TABLET | Freq: Once | ORAL | Status: AC
  Administered 2024-07-27: 300 mg via ORAL
  Filled 2024-07-27: qty 1

## 2024-07-27 MED ORDER — SODIUM CHLORIDE 0.9% FLUSH
3.0000 mL | Freq: Once | INTRAVENOUS | Status: AC
  Administered 2024-07-27: 3 mL via INTRAVENOUS

## 2024-07-27 MED ORDER — STROKE: EARLY STAGES OF RECOVERY BOOK: Freq: Once | Status: AC

## 2024-07-27 MED ORDER — FENTANYL CITRATE PF 50 MCG/ML IJ SOSY
12.5000 ug | PREFILLED_SYRINGE | Freq: Once | INTRAMUSCULAR | Status: AC
  Administered 2024-07-27: 12.5 ug via INTRAVENOUS
  Filled 2024-07-27: qty 1

## 2024-07-27 MED ORDER — ENOXAPARIN SODIUM 40 MG/0.4ML IJ SOSY
40.0000 mg | PREFILLED_SYRINGE | Freq: Every day | INTRAMUSCULAR | Status: DC
  Administered 2024-07-28 – 2024-08-01 (×5): 40 mg via SUBCUTANEOUS
  Filled 2024-07-27 (×5): qty 0.4

## 2024-07-27 MED ORDER — ACETAMINOPHEN 325 MG PO TABS
650.0000 mg | ORAL_TABLET | ORAL | Status: DC | PRN
  Administered 2024-07-29 – 2024-07-30 (×2): 650 mg via ORAL
  Filled 2024-07-27 (×2): qty 2

## 2024-07-27 MED ORDER — SENNOSIDES-DOCUSATE SODIUM 8.6-50 MG PO TABS: 1.0000 | ORAL_TABLET | Freq: Every evening | ORAL | Status: DC | PRN

## 2024-07-27 MED ORDER — CLOPIDOGREL BISULFATE 75 MG PO TABS
75.0000 mg | ORAL_TABLET | Freq: Every day | ORAL | Status: DC
  Administered 2024-07-29 – 2024-08-01 (×4): 75 mg via ORAL
  Filled 2024-07-27 (×5): qty 1

## 2024-07-27 MED ORDER — SODIUM CHLORIDE 0.9 % IV SOLN: INTRAVENOUS | Status: AC

## 2024-07-27 MED ORDER — ACETAMINOPHEN 650 MG RE SUPP: 650.0000 mg | RECTAL | Status: DC | PRN

## 2024-07-27 MED ORDER — SODIUM CHLORIDE 0.9 % IV SOLN
INTRAVENOUS | Status: DC
Start: 2024-07-27 — End: 2024-07-28

## 2024-07-27 MED ORDER — SODIUM CHLORIDE 0.9 % IV BOLUS: 1000.0000 mL | Freq: Once | INTRAVENOUS | Status: DC

## 2024-07-27 MED ORDER — IOHEXOL 350 MG/ML SOLN
150.0000 mL | Freq: Once | INTRAVENOUS | Status: AC | PRN
  Administered 2024-07-27: 150 mL via INTRAVENOUS

## 2024-07-27 MED ORDER — CLOPIDOGREL BISULFATE 75 MG PO TABS
75.0000 mg | ORAL_TABLET | Freq: Every day | ORAL | Status: DC
  Administered 2024-07-28: 75 mg via ORAL
  Filled 2024-07-27: qty 1

## 2024-07-27 MED ORDER — LORAZEPAM 0.5 MG PO TABS: 0.5000 mg | ORAL_TABLET | ORAL | Status: DC | PRN

## 2024-07-27 MED ORDER — ACETAMINOPHEN 160 MG/5ML PO SOLN
650.0000 mg | ORAL | Status: DC | PRN
  Filled 2024-07-27: qty 20.3

## 2024-07-27 NOTE — ED Provider Notes (Signed)
 [place] EMERGENCY DEPARTMENT AT [facility] Provider Note   CSN: [id number] Arrival date & time: 07/27/24  1456  An emergency department physician performed an initial assessment on this suspected stroke patient at 6.  Patient presents with: Code Stroke   Christie Meza is a 78 y.o. female.   HPI Patient brought by EMS as a code stroke.  Patient had been taken directly to CT with neurology prior to my assessment.  I was able to start assessment in CT scanner with Dr. Lindzen at bedside.  By history patient had a sudden onset per family of inability to talk.  Reportedly there was a limited additional history.  EMS noted that the patient had difficulty with coherently speaking and felt that there was a facial droop present but did not identify any limb weakness and patient reportedly was able to walk to the EMS stretcher.  In the scanner, patient is awake but slow with responses and performing neurologic exam slowly with appearance of distraction or confusion and inconsistent effort for motor testing.    Prior to Admission medications   Medication Sig Start Date End Date Taking? Authorizing Provider  acetaminophen  (TYLENOL ) 325 MG tablet Take 2 tablets (650 mg total) by mouth every 4 (four) hours as needed for mild pain (or temp > 37.5 C (99.5 F)). 03/04/22   Danton Reyes DASEN, MD  bisacodyl  (DULCOLAX) 10 MG suppository Place 1 suppository (10 mg total) rectally daily as needed for moderate constipation. 03/04/22   Danton Reyes DASEN, MD  docusate sodium  (COLACE) 100 MG capsule Take 1 capsule (100 mg total) by mouth 2 (two) times daily. 03/04/22   Danton Reyes DASEN, MD  DULoxetine  (CYMBALTA ) 30 MG capsule Take 30 mg by mouth daily. 12/28/21   [provider]  gabapentin  (NEURONTIN ) 100 MG capsule Take 100 mg by mouth 3 (three) times daily. 12/17/21   [provider]  HYDROcodone -acetaminophen  (NORCO/VICODIN) 5-325 MG tablet Take 1 tablet by mouth every 4 (four)  hours as needed for moderate pain. 03/04/22   Danton Reyes DASEN, MD  isosorbide -hydrALAZINE  (BIDIL ) 20-37.5 MG tablet Take 1 tablet by mouth 3 (three) times daily. 03/04/22   Danton Reyes DASEN, MD  methocarbamol  (ROBAXIN ) 500 MG tablet Take 1 tablet (500 mg total) by mouth every 6 (six) hours as needed for muscle spasms. 03/04/22   Danton Reyes DASEN, MD  omeprazole (PRILOSEC) 20 MG capsule Take 20 mg by mouth daily. 11/25/21   [provider]  pravastatin  (PRAVACHOL ) 40 MG tablet Take 40 mg by mouth daily. 12/28/21   [provider]  QUEtiapine  (SEROQUEL ) 50 MG tablet Take 50 mg by mouth at bedtime. 02/10/22   [provider]  senna (SENOKOT) 8.6 MG TABS tablet Take 1 tablet (8.6 mg total) by mouth 2 (two) times daily. 03/04/22   Danton Reyes DASEN, MD  traZODone  (DESYREL ) 100 MG tablet Take 100 mg by mouth at bedtime. 01/11/22   [provider]  vitamin B-12 1000 MCG tablet Take 1 tablet (1,000 mcg total) by mouth daily. 03/05/22   Danton Reyes DASEN, MD    Allergies: Aspirin     Review of Systems  Updated Vital Signs BP (!) 173/77   Pulse 85   Resp 13   Wt 106 kg   SpO2 97%   BMI 38.89 kg/m   Physical Exam Constitutional:      Comments: Patient is awake and protecting her airway.  She can make some responses that are situationally appropriate.  No respiratory  distress.  HENT:     Head: Normocephalic and atraumatic.     Mouth/Throat:     Pharynx: Oropharynx is clear.  Eyes:     Extraocular Movements: Extraocular movements intact.  Cardiovascular:     Rate and Rhythm: Normal rate and regular rhythm.  Pulmonary:     Effort: Pulmonary effort is normal.     Breath sounds: Normal breath sounds.  Abdominal:     General: There is no distension.     Palpations: Abdomen is soft.     Tenderness: There is no abdominal tenderness.  Musculoskeletal:     Comments: No appearance of specific extremity deformities.  Patient is variably moving each extremity.   Skin:    General: Skin is warm and dry.  Neurological:     Comments: My first encounter with the patient is in the CT scanner.  She seems either somnolent, cognitively slowed at this time or confused.  She is following commands but with inconsistency.  Cannot appreciate localizing paralysis.  Patient's speech is slowed and may be confused intermittently.  There is slurring.     (all labs ordered are listed, but only abnormal results are displayed) Labs Reviewed  CBC - Abnormal; Notable for the following components:      Result Value   RBC 3.40 (*)    Hemoglobin 8.1 (*)    HCT 25.5 (*)    MCV 75.0 (*)    MCH 23.8 (*)    RDW 16.0 (*)    All other components within normal limits  COMPREHENSIVE METABOLIC PANEL WITH GFR - Abnormal; Notable for the following components:   Glucose, Bld 104 (*)    Creatinine, Ser 1.54 (*)    Calcium 10.7 (*)    Alkaline Phosphatase 149 (*)    GFR, Estimated 35 (*)    All other components within normal limits  I-STAT CHEM 8, ED - Abnormal; Notable for the following components:   Creatinine, Ser 1.60 (*)    Glucose, Bld 103 (*)    Hemoglobin 8.5 (*)    HCT 25.0 (*)    All other components within normal limits  CBG MONITORING, ED - Abnormal; Notable for the following components:   Glucose-Capillary 106 (*)    All other components within normal limits  PROTIME-INR  APTT  DIFFERENTIAL  ETHANOL    EKG: EKG Interpretation Date/Time:  Saturday July 27 2024 15:47:14 EDT Ventricular Rate:  79 PR Interval:  249 QRS Duration:  105 QT Interval:  375 QTC Calculation: 430 R Axis:   4  Text Interpretation: Sinus rhythm Atrial premature complex Prolonged PR interval otherwise normal, no sig chnage from previous Confirmed by Armenta Canning [phone]) on 07/27/2024 3:57:47 PM  Radiology: CT ANGIO HEAD NECK W WO CM (CODE STROKE) Addendum Date: 07/27/2024 ADDENDUM REPORT: 07/27/2024 16:34 ADDENDUM: Due to motion on the initial acquisition, a repeat scan  was performed. Unchanged appearance of the M1 and more distal left middle cerebral artery vessels. 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 Beyond this, there is apparent occlusion of multiple left middle cerebral artery branches (beginning at the proximal M2 segment level). These results were called by telephone at the time of interpretation on 07/27/2024 at 4:25 pm to provider ERIC [facility] , who verbally acknowledged these results. The previously described stenosis of the paraclinoid left ICA appears moderate-to-severe on the most recent CTA acquisition. The previously described stenosis within the right posterior cerebral artery P2 segment appears  severe on the most recent CTA acquisition. Electronically Signed   By: Rockey Childs D.O.   On: 07/27/2024 16:34   Result Date: 07/27/2024 CLINICAL DATA:  Provided history: Neuro deficit, acute, stroke suspected. EXAM: CT ANGIOGRAPHY HEAD AND NECK WITH AND WITHOUT CONTRAST TECHNIQUE: Multidetector CT imaging of the head and neck was performed using the standard protocol during bolus administration of intravenous contrast. Multiplanar CT image reconstructions and MIPs were obtained to evaluate the vascular anatomy. Carotid stenosis measurements (when applicable) are obtained utilizing NASCET criteria, using the distal internal carotid diameter as the denominator. RADIATION DOSE REDUCTION: This exam was performed according to the departmental dose-optimization program which includes automated exposure control, adjustment of the mA and/or kV according to patient size and/or use of iterative reconstruction technique. CONTRAST:  Administered contrast not known at this time. COMPARISON:  Noncontrast head CT performed earlier today 07/27/2024. FINDINGS: CTA NECK FINDINGS Aortic arch: Standard aortic branching. Minimal atherosclerotic plaque within the aortic arch. Streak/beam hardening artifact arising from a  dense contrast bolus partially obscures the left subclavian artery. Within this limitation, there is no appreciable hemodynamically significant innominate or proximal subclavian artery stenosis. Right carotid system: CCA and ICA patent within the neck without stenosis or significant atherosclerotic disease. Partially retropharyngeal course of the cervical ICA. Left carotid system: CCA and ICA patent within the neck without stenosis. Minimal atherosclerotic plaque about the carotid bifurcation. Vertebral arteries: Venous reflux of contrast partially obscures the left vertebral artery proximal V1 segment. Within this limitation, the vertebral arteries are codominant and patent within the neck without stenosis or significant aspect disease. Skeleton: Prior C3-C5 ACDF. Cervical and thoracic spondylosis. No acute fracture or aggressive osseous lesion. Other neck: Multiple thyroid  nodules (the largest an exophytic nodule projecting inferiorly from the lower pole of the right thyroid  lobe measuring 3 cm). This nodule extends into the superior mediastinum. Upper chest: No consolidation within the imaged lung apices. Review of the MIP images confirms the above findings CTA HEAD FINDINGS Moderately motion degraded examination. Within this limitation, findings are as follows. Anterior circulation: The intracranial internal carotid arteries are patent. Atherosclerotic plaque within both vessels. Most notably, there is up to moderate stenosis of the paraclinoid ICAs, bilaterally. Occlusion versus severe near-occlusive stenosis within the left middle cerebral artery mid-to-distal M1 segment (for instance as seen on series 10, image 19). Beyond this, multiple left MCA branches are poorly delineated (beginning at the proximal M2 segment level) and are likely occluded. The right middle cerebral artery M1 segment is patent. No right M2 proximal branch occlusion or high-grade proximal stenosis is identified. The anterior cerebral  arteries are patent. No intracranial aneurysm is identified. Posterior circulation: The intracranial vertebral arteries are patent. The basilar artery is patent. The posterior cerebral arteries are patent. Moderate stenosis within the right posterior cerebral artery P2 segment. Posterior communicating arteries are diminutive or absent, bilaterally. Venous sinuses: Within the limitations of contrast timing, no convincing thrombus. Anatomic variants: As described. Review of the MIP images confirms the above findings CTA head impressiopn #2 called by telephone at the time of interpretation on 07/27/2024 at 3:35 pm to provider ERIC [facility] , who verbally acknowledged these results. IMPRESSION: CTA neck: 1. The common carotid and internal carotid arteries are patent within the neck without stenosis. Minimal atherosclerotic plaque on the left. 2. Venous reflux of contrast partially obscures the left vertebral artery proximal V1 segment. Within this limitation, the vertebral arteries are patent within the neck without stenosis or significant atherosclerotic disease. 3. Aortic  Atherosclerosis (ICD10-I70.0). 4. 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 A nonemergent thyroid  ultrasound is recommended for further evaluation. Reference: J Am Coll Radiol. 2015 Feb;12(2): 143-50. CTA head: 1. Moderately motion degraded examination. Within this limitation, findings are as follows. 2. Occlusion versus severe-near occlusive stenosis of the left middle cerebral artery mid-to-distal M1 segment. Beyond this, multiple left MCA branches are also poorly delineated (beginning at the proximal M2 segment level) and are likely occluded. 3. Background intracranial atherosclerotic disease as described within the body of the report. Up to moderate stenosis of the paraclinoid internal carotid arteries, bilaterally. Moderate stenosis within the right posterior cerebral artery P2 segment. Electronically Signed: By: Rockey Childs D.O. On:  07/27/2024 15:51   CT HEAD CODE STROKE WO CONTRAST Result Date: 07/27/2024 CLINICAL DATA:  Code stroke. Provided history: Neuro deficit, acute, stroke suspected. EXAM: CT HEAD WITHOUT CONTRAST TECHNIQUE: Contiguous axial images were obtained from the base of the skull through the vertex without intravenous contrast. RADIATION DOSE REDUCTION: This exam was performed according to the departmental dose-optimization program which includes automated exposure control, adjustment of the mA and/or kV according to patient size and/or use of iterative reconstruction technique. COMPARISON:  Brain MRI 02/23/2022.  Head CT 02/23/2022. FINDINGS: Brain: Generalized cerebral atrophy. A cortical/subcortical infarct within the left parietal lobe has progressed in extent as compared to the brain MRI of 02/23/2022. Small age-indeterminate cortical infarct within the mid to posterior left frontal lobe, new from the prior MRI (series 5, image 37) (series 2, image 23). Background mild patchy and ill-defined hypoattenuation within the cerebral white matter, nonspecific but compatible with chronic small vessel image disease. There is no acute intracranial hemorrhage. No extra-axial fluid collection. No evidence of an intracranial mass. No midline shift. Vascular: No hyperdense vessel.  Atherosclerotic calcifications. Skull: No calvarial fracture or aggressive osseous lesion. Sinuses/Orbits: No mass or acute finding within the imaged orbits. Trace consult thickening within the bilateral ethmoid sinuses. ASPECTS [facility] Stroke Program Early CT Score) - Ganglionic level infarction (caudate, lentiform nuclei, internal capsule, insula, M1-M3 cortex): 6 - Supraganglionic infarction (M4-M6 cortex): 1 Total score (0-10 with 10 being normal): 7 Age-indeterminate infarcts within the left frontal and left parietal lobes. These results were communicated to Dr. Lindzen at 3:19 pmon 10/4/2025by text page via the [facility] messaging system. IMPRESSION: 1.  Small age-indeterminate cortical infarct within the mid-to-posterior left frontal lobe, new from the prior brain MRI of 02/23/2022. Additionally, a left parietal lobe cortical/subcortical infarct has increased in extent raising the possibility of an acute on chronic infarct at this site. Consider a brain MRI for further evaluation. ASPECTS is 7. 2. Background parenchymal atrophy and chronic small vessel ischemic disease. Electronically Signed   By: Rockey Childs D.O.   On: 07/27/2024 15:20     Procedures  CRITICAL CARE Performed by: Ludivina Shines   Total critical care time: 30 minutes  Critical care time was exclusive of separately billable procedures and treating other patients.  Critical care was necessary to treat or prevent imminent or life-threatening deterioration.  Critical care was time spent personally by me on the following activities: development of treatment plan with patient and/or surrogate as well as nursing, discussions with consultants, evaluation of patient's response to treatment, examination of patient, obtaining history from patient or surrogate, ordering and performing treatments and interventions, ordering and review of laboratory studies, ordering and review of radiographic studies, pulse oximetry and re-evaluation of patient's condition.  Medications Ordered in the ED  clopidogrel (PLAVIX) tablet 300  mg (has no administration in time range)  clopidogrel (PLAVIX) tablet 75 mg (has no administration in time range)  0.9 %  sodium chloride  infusion (has no administration in time range)  sodium chloride  flush (NS) 0.9 % injection 3 mL (3 mLs Intravenous Given 07/27/24 1550)  fentaNYL  (SUBLIMAZE ) injection 12.5 mcg (12.5 mcg Intravenous Given 07/27/24 1550)  iohexol  (OMNIPAQUE ) 350 MG/ML injection 150 mL (150 mLs Intravenous Contrast Given 07/27/24 1618)                                    Medical Decision Making Amount and/or Complexity of Data Reviewed Labs:  ordered. Radiology: ordered.  Risk Decision regarding hospitalization.  Patient presents as outlined as code stroke.  Report with for change in speech with slurring and possible facial drooping.  Patient immediately taken to CT scanner with Dr. Lindzen.  There was equivocal finding of a severe left MCA stenosis versus occlusion felt to be more likely stenosis and does not indication for thrombolytics and too high risk for stenting.  At the time my evaluation no obvious other source for mental status change.  Patient does not have appearance of sepsis, no apparent appearance of source of infection at this time by history or diagnostic evaluation.  No significant metabolic derangement to account for acute mental status change.  Patient does have anemia at 8.1 however she does chronically have anemia ranging from sometimes 8-10.  Unlikely to be source of mental status change.  Neurology recommendation is for admission with permissive hypertension.  Consult: Reviewed with Dr. 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

## 2024-07-27 NOTE — ED Triage Notes (Signed)
 Pt bibems from home. LKW 7pm when daughter left her house last night. No thinners. R sided droop, slurred speech. Per EMS pt called daughter at 37 says that she was having problems talking.

## 2024-07-27 NOTE — H&P (Signed)
 History and Physical    Patient: Christie Meza:[id number] DOB: [birth year] DOA: 07/27/2024 DOS: the patient was seen and examined on 07/27/2024 PCP: Shelda Atlas, MD  Patient coming from: Home  Chief Complaint:  Chief Complaint  Patient presents with   Code Stroke   HPI: Christie Meza is a 78 y.o. female with medical history significant of essential hypertension, hyperlipidemia, bipolar disorder, chronic pain syndrome, history of alcohol abuse, who presented to the ER via EMS today with right-sided facial droop and some slurred speech.  Patient apparently was last known normal about 7:00 last night according to daughter.  About 1:30 PM this afternoon she called the daughter telling her that she is on the road to speak well.  Not sure of the timing.  When EMS arrive she did have a right facial droop and bilateral leg weakness.  There was global aphasia and dysarthria.  Patient was deemed not a candidate for tPA administration due to unknown duration and last known normal.  She has been seen by neurology and patient had initial head CT that suggested a small CVA In the mid to posterior left frontal lobe.  Patient is being admitted there for for further evaluation and treatment.  Review of Systems: As mentioned in the history of present illness. All other systems reviewed and are negative. Past Medical History:  Diagnosis Date   Bipolar 1 disorder (HCC)    Hypertension    Obesity    Past Surgical History:  Procedure Laterality Date   ABDOMINAL HYSTERECTOMY  ~2002   ANTERIOR CERVICAL DECOMP/DISCECTOMY FUSION N/A 02/24/2022   Procedure: ANTERIOR CERVICAL THREE- FOUR AND CERVICAL FOUR THROUGH FIVE DECOMPRESSION/DISCECTOMY FUSION 2 LEVELS;  Surgeon: Colon Shove, MD;  Location: [facility] OR;  Service: Neurosurgery;  Laterality: N/A;   Social History:  reports that she has never smoked. She has never used smokeless tobacco. She reports current alcohol use. She reports current drug  use.  Allergies  Allergen Reactions   Aspirin  Hives    Family History  Problem Relation Age of Onset   Kidney disease Mother    Hypertension Mother    Kidney disease Brother     Prior to Admission medications   Medication Sig Start Date End Date Taking? Authorizing Provider  acetaminophen  (TYLENOL ) 325 MG tablet Take 2 tablets (650 mg total) by mouth every 4 (four) hours as needed for mild pain (or temp > 37.5 C (99.5 F)). 03/04/22   Danton Reyes DASEN, MD  bisacodyl  (DULCOLAX) 10 MG suppository Place 1 suppository (10 mg total) rectally daily as needed for moderate constipation. 03/04/22   Danton Reyes DASEN, MD  docusate sodium  (COLACE) 100 MG capsule Take 1 capsule (100 mg total) by mouth 2 (two) times daily. 03/04/22   Danton Reyes DASEN, MD  DULoxetine  (CYMBALTA ) 30 MG capsule Take 30 mg by mouth daily. 12/28/21   [provider]  gabapentin  (NEURONTIN ) 100 MG capsule Take 100 mg by mouth 3 (three) times daily. 12/17/21   [provider]  HYDROcodone -acetaminophen  (NORCO/VICODIN) 5-325 MG tablet Take 1 tablet by mouth every 4 (four) hours as needed for moderate pain. 03/04/22   Danton Reyes DASEN, MD  isosorbide -hydrALAZINE  (BIDIL ) 20-37.5 MG tablet Take 1 tablet by mouth 3 (three) times daily. 03/04/22   Danton Reyes DASEN, MD  methocarbamol  (ROBAXIN ) 500 MG tablet Take 1 tablet (500 mg total) by mouth every 6 (six) hours as needed for muscle spasms. 03/04/22   Danton Reyes DASEN, MD  omeprazole (PRILOSEC) 20 MG capsule  Take 20 mg by mouth daily. 11/25/21   [provider]  pravastatin  (PRAVACHOL ) 40 MG tablet Take 40 mg by mouth daily. 12/28/21   [provider]  QUEtiapine  (SEROQUEL ) 50 MG tablet Take 50 mg by mouth at bedtime. 02/10/22   [provider]  senna (SENOKOT) 8.6 MG TABS tablet Take 1 tablet (8.6 mg total) by mouth 2 (two) times daily. 03/04/22   Danton Reyes DASEN, MD  traZODone  (DESYREL ) 100 MG tablet Take 100 mg by mouth at bedtime.  01/11/22   [provider]  vitamin B-12 1000 MCG tablet Take 1 tablet (1,000 mcg total) by mouth daily. 03/05/22   Danton Reyes DASEN, MD    Physical Exam: Vitals:   07/27/24 1548 07/27/24 1615 07/27/24 1630 07/27/24 1645  BP:  (!) 147/116 (!) 173/77 (!) 173/79  Pulse:   85 85  Resp:  13 13 14   SpO2: 96%  97% 96%  Weight:       Constitutional: Acutely ill looking, aphasic, no distress NAD, calm, comfortable Eyes: PERRL, lids and conjunctivae normal ENMT: Mucous membranes are moist. Posterior pharynx clear of any exudate or lesions.Normal dentition.  Neck: normal, supple, no masses, no thyromegaly Respiratory: clear to auscultation bilaterally, no wheezing, no crackles. Normal respiratory effort. No accessory muscle use.  Cardiovascular: Regular rate and rhythm, no murmurs / rubs / gallops. No extremity edema. 2+ pedal pulses. No carotid bruits.  Abdomen: no tenderness, no masses palpated. No hepatosplenomegaly. Bowel sounds positive.  Musculoskeletal: Good range of motion, no joint swelling or tenderness, Skin: no rashes, lesions, ulcers. No induration Neurologic: Right facial droop, bilateral lower extremity weakness more to the left, slurred speech Psychiatric: Global aphasia  Data Reviewed:  Creatinine is 1.6, alk phos 149, hemoglobin 8.5 with normal platelets and white count.  Head CT without contrast showed small age-indeterminate cortical infarct within the mid to posterior left frontal lobe which is new.  There is background parenchymal atrophy.  CT angio head and neck showed, carotid and intercarpal arteries that are patent.  No large vessel occlusion  Assessment and Plan:  #1 acute CVA: Patient will be admitted to complete CVA workup.  Echocardiogram.  Neurology consult.  PT and OT consultation.  Patient is allergic to aspirin  so we will use Plavix.  Speech therapy evaluation.  Also has statin.  Will defer to neurology for any directions regarding care.  #2  hyperlipidemia: Continue with statin  #3 essential hypertension: Permissive hypertension for now then resume full medications  #4 history of alcoholism: No recent intake.  Continue to monitor  #5 bipolar disorder: Confirmed on resume home regimen  #6 history of urine incontinence: Continue with home regimen.  Will likely need Foley catheter in the setting of new stroke.    Advance Care Planning:   Code Status: Full Code   Consults: Dr. Merrianne, neurologist  Family Communication: Daughter  Severity of Illness: The appropriate patient status for this patient is INPATIENT. Inpatient status is judged to be reasonable and necessary in order to provide the required intensity of service to ensure the patient's safety. The patient's presenting symptoms, physical exam findings, and initial radiographic and laboratory data in the context of their chronic comorbidities is felt to place them at high risk for further clinical deterioration. Furthermore, it is not anticipated that the patient will be medically stable for discharge from the hospital within 2 midnights of admission.   * I certify that at the point of admission it is my clinical judgment that  the patient will require inpatient hospital care spanning beyond 2 midnights from the point of admission due to high intensity of service, high risk for further deterioration and high frequency of surveillance required.*  AuthorBETHA SIM KNOLL, MD 07/27/2024 7:02 PM  For on call review [email or web address].

## 2024-07-27 NOTE — ED Provider Notes (Addendum)
 15:14 Patient was taken directly to CT as code stroke just prior to my shift arrival.  Patient not back to ED room at this time.   Armenta Canning, MD 07/27/24 1515 15: 18 patient is in the CT scanner.  I evaluated.  Dr. Lindzen is currently doing neurologic exam and scoring.  Patient is awake.  She is alert.  She is protecting airway.  She will be returning to the room for further examination.   Armenta Canning, MD 07/27/24 [phone]

## 2024-07-27 NOTE — Consult Note (Signed)
 NEUROLOGY CONSULT NOTE   Date of service: July 27, 2024 Patient Name: AKAIYA TOUCHETTE MRN:  [id number] DOB:  [birth year] Chief Complaint: Difficulty with speech Requesting Provider: Armenta Canning, MD  History of Present Illness  CAIDYNCE MUZYKA is a 78 y.o. female with hx of bipolar 1 diorder, HTN and obesity, who presents to the ED from home via EMS as a Code Stroke, after family noted that she suddenly could not talk. When EMS arrived the patient continued to have difficulty communicating coherently, with facial droop also noted, but no limb weakness and was able to walk to the stretcher. BP was 180/96 per EMS. On arrival to the ED the patient is with decreased level of alertness, but will become agitated and bends her right knee while writhing on the stretcher with a pained affect. Per EMS, she also had complained of low back pain.   LKW: 7:00 PM Friday Modified rankin score: 0 IV Thrombolysis:  No (reason) EVT: No: No definite occlusion. 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 Correctly recalls the year, city and state.   1c LOC Commands 0[x]  1[]  2[]      Increased latencies of motor responses in the context of poor attention.   2 Best Gaze 0[x]  1[]  2[]       3 Visual 0[x]  1[]  2[]  3[]      4 Facial Palsy 0[]  1[]  2[x]  3[]     Right facial droop  5a Motor Arm - left 0[x]  1[]  2[]  3[]  4[]  UN[]    5b Motor Arm - Right 0[x]  1[]  2[]  3[]  4[]  UN[]    6a Motor Leg - Left 0[]  1[]  2[]  3[x]  4[]  UN[]   Withdraws briskly to noxious stimulation   6b Motor Leg - Right 0[]  1[]  2[]  3[x]  4[]  UN[]   Withdraws briskly to noxious stimulation   7 Limb Ataxia 0[x]  1[]  2[]  UN[]      8 Sensory 0[x]  1[]  2[]  UN[]     Reacts to pain equally x 4  9 Best Language 0[]  1[x]  2[]  3[]     Can name her chin, ear and nose, but cannot name any of her  fingers except for her thumb.   10 Dysarthria 0[]  1[x]  2[]  UN[]      11 Extinct. and Inattention 0[x]  1[]  2[]       TOTAL:   12      ROS  Unable to obtain due to confusion.   Past History   Past Medical History:  Diagnosis Date   Bipolar 1 disorder (HCC)    Hypertension    Obesity     Past Surgical History:  Procedure Laterality Date   ABDOMINAL HYSTERECTOMY  ~2002   ANTERIOR CERVICAL DECOMP/DISCECTOMY FUSION N/A 02/24/2022   Procedure: ANTERIOR CERVICAL THREE- FOUR AND CERVICAL FOUR THROUGH FIVE DECOMPRESSION/DISCECTOMY FUSION 2 LEVELS;  Surgeon: Colon Shove, MD;  Location: [facility] OR;  Service: Neurosurgery;  Laterality: N/A;    Family History: Family History  Problem Relation Age of Onset   Kidney disease Mother    Hypertension Mother    Kidney disease Brother     Social History  reports that she has never smoked. She has never used smokeless tobacco. She reports current alcohol use. She reports current drug use.  Allergies  Allergen Reactions   Aspirin  Hives  Medications   Current Facility-Administered Medications:    sodium chloride  flush (NS) 0.9 % injection 3 mL, 3 mL, Intravenous, Once, Armenta Canning, MD  Current Outpatient Medications:    acetaminophen  (TYLENOL ) 325 MG tablet, Take 2 tablets (650 mg total) by mouth every 4 (four) hours as needed for mild pain (or temp > 37.5 C (99.5 F))., Disp: , Rfl:    bisacodyl  (DULCOLAX) 10 MG suppository, Place 1 suppository (10 mg total) rectally daily as needed for moderate constipation., Disp: 12 suppository, Rfl: 0   docusate sodium  (COLACE) 100 MG capsule, Take 1 capsule (100 mg total) by mouth 2 (two) times daily., Disp: 10 capsule, Rfl: 0   DULoxetine  (CYMBALTA ) 30 MG capsule, Take 30 mg by mouth daily., Disp: , Rfl:    gabapentin  (NEURONTIN ) 100 MG capsule, Take 100 mg by mouth 3 (three) times daily., Disp: , Rfl:    HYDROcodone -acetaminophen  (NORCO/VICODIN) 5-325 MG tablet, Take 1 tablet by mouth every 4 (four)  hours as needed for moderate pain., Disp: 15 tablet, Rfl: 0   isosorbide -hydrALAZINE  (BIDIL ) 20-37.5 MG tablet, Take 1 tablet by mouth 3 (three) times daily., Disp: , Rfl:    methocarbamol  (ROBAXIN ) 500 MG tablet, Take 1 tablet (500 mg total) by mouth every 6 (six) hours as needed for muscle spasms., Disp: 15 tablet, Rfl: 0   omeprazole (PRILOSEC) 20 MG capsule, Take 20 mg by mouth daily., Disp: , Rfl:    pravastatin  (PRAVACHOL ) 40 MG tablet, Take 40 mg by mouth daily., Disp: , Rfl:    QUEtiapine  (SEROQUEL ) 50 MG tablet, Take 50 mg by mouth at bedtime., Disp: , Rfl:    senna (SENOKOT) 8.6 MG TABS tablet, Take 1 tablet (8.6 mg total) by mouth 2 (two) times daily., Disp: 120 tablet, Rfl: 0   traZODone  (DESYREL ) 100 MG tablet, Take 100 mg by mouth at bedtime., Disp: , Rfl:    vitamin B-12 1000 MCG tablet, Take 1 tablet (1,000 mcg total) by mouth daily., Disp: , Rfl:   Vitals   Vitals:   08-16-2024 1500  Weight: 106 kg    Body mass index is 38.89 kg/m.   Physical Exam   Constitutional: Appears well-developed and well-nourished.  Psych: Anxious affect  Eyes: No scleral injection.  HENT: No OP obstruction.  Head: Normocephalic. No neck stiffness.  Respiratory: Effort normal, non-labored breathing.    Neurologic Examination   See NIHSS  Labs/Imaging/Neurodiagnostic studies   CBC:  Recent Labs  Lab Aug 16, 2024 1503 08/16/24 1505  WBC 5.3  --   NEUTROABS 3.2  --   HGB 8.1* 8.5*  HCT 25.5* 25.0*  MCV 75.0*  --   PLT 194  --    Basic Metabolic Panel:  Lab Results  Component Value Date   NA 143 08/16/24   K 4.0 2024-08-16   CO2 25 05/02/2023   GLUCOSE 103 (H) 08-16-24   BUN 13 Aug 16, 2024   CREATININE 1.60 (H) 08/16/24   CALCIUM 11.1 (H) 05/02/2023   GFRNONAA >60 03/04/2022   GFRAA 79 (L) 12/03/2014   Lipid Panel:  Lab Results  Component Value Date   LDLCALC 107 (H) 05/02/2023   HgbA1c:  Lab Results  Component Value Date   HGBA1C 5.7 (H) 02/23/2022   Urine  Drug Screen:     Component Value Date/Time   LABOPIA NONE DETECTED 02/23/2022 1659   COCAINSCRNUR NONE DETECTED 02/23/2022 1659   LABBENZ NONE DETECTED 02/23/2022 1659   AMPHETMU NONE DETECTED 02/23/2022 1659   THCU NONE DETECTED 02/23/2022 1659  LABBARB NONE DETECTED 02/23/2022 1659    Alcohol Level     Component Value Date/Time   ETH <10 02/23/2022 1500   INR  Lab Results  Component Value Date   INR 1.0 02/23/2022   APTT  Lab Results  Component Value Date   APTT 30 02/23/2022     ASSESSMENT  78 y.o. female with hx of bipolar 1 diorder, HTN and obesity, who presents to the ED from home via EMS as a Code Stroke, after family noted that she suddenly could not talk. When EMS arrived the patient continued to have difficulty communicating coherently, with facial droop also noted, but no limb weakness and was able to walk to the stretcher. BP was 180/96 per EMS. On arrival to the ED the patient is with decreased level of alertness, but will become agitated and bends her right knee while writhing on the stretcher with a pained affect. Per EMS, she also had complained of low back pain. - Exam reveals an acutely confused and agitated patient with intermittent pained affect especially when moving, poor attention, who can name simple objects and follow simple commands, but overall is poorly compliant with exam due to back pain. NIHSS 12.  - imaging: - CT head: Age-indeterminate infarcts in the left frontal and parietal lobes - CTA of neck: The common carotid and internal carotid arteries are patent within the neck without stenosis. Minimal atherosclerotic plaque on the left. Venous reflux of contrast partially obscures the left vertebral artery proximal V1 segment. Within this limitation, the vertebral arteries are patent within the neck without stenosis or significant atherosclerotic disease. Aortic atherosclerosis. 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 A nonemergent thyroid   ultrasound is recommended for further evaluation. - CTA head: Moderately motion degraded examination. Within this limitation, findings are as follows. Occlusion versus severe-near occlusive stenosis of the left middle cerebral artery mid-to-distal M1 segment. Beyond this, multiple left MCA branches are also poorly delineated (beginning at the proximal M2 segment level) and are likely occluded. Background intracranial atherosclerotic disease as described within the body of the report. Up to moderate stenosis of the paraclinoid internal carotid arteries, bilaterally. Moderate stenosis within the right posterior cerebral artery P2 segment.   - On personal review of CTA head, in the context of movement artifact that compromises the study, the findings appear most consistent with severe stenosis, with occlusion being less likely. There is smeared signal from the contrast bolus overlapping the left M1 segment, most consistent with artifact in conjunction with stenosis. - Repeat CTA head with fentanyl  sedation to decrease movement artifact: Unchanged appearance of the M1 and more distal left middle cerebral artery vessels. 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 Beyond this, there is apparent occlusion of multiple left middle cerebral artery branches (beginning at the proximal M2 segment level). - Reviewed images with Dr. Ray of [facility]. When correlating with prior MRI brain, the right M1 findings appear most consistent with chronic severe stenosis. Risks of stenting are felt to significantly outweigh potential benefits.  - Impression: TIA versus left MCA CVA in the context of severe left M1 stenosis.   RECOMMENDATIONS  - Plavix 300 mg PO x 1 now. - Continue scheduled Plavix at 75 mg po every day starting tomorrow - Hold off on ASA due to allergy (hives) - MRI brain - HgbA1c, fasting lipid panel - MRI/MRA of the brain without contrast - PT  consult, OT consult, Speech consult - Permissive  HTN - IV NS at 75 cc/hr. Frequent assessment to evaluate for possible volume overload.  - TTE  - Statin - Risk factor modification - Telemetry monitoring - Frequent neuro checks - NPO until passes stroke swallow screen   ______________________________________________________________________    Bonney SHARK, Sukhraj Esquivias, MD [facility]

## 2024-07-27 NOTE — Code Documentation (Signed)
 Stroke Response Nurse Documentation Code Documentation  Christie Meza is a 78 y.o. female arriving to [facility]  via [facility] on 08-27-24 with past medical hx of bipolar, HTN, ETOH, . On No antithrombotic. Code stroke was activated by EMS.   Patient from home where she was LKW at 1900 yesterday by her daughter and now complaining of difficulty speaking.  About 1330 this afternoon she called her daughter to say that she was having difficulty speaking.  Stroke team at the bedside on patient arrival. Labs drawn and patient cleared for CT by Dr. Armenta. Patient to CT with team. NIHSS 8, see documentation for details and code stroke times. Patient with disoriented, right facial droop, bilateral leg weakness, Global aphasia , and dysarthria  on exam. The following imaging was completed:  CT Head and CTA. Patient is not a candidate for IV Thrombolytic due to LKW outside tnk window. Patient is not a candidate for IR due to chronic stenosis.   Care Plan: VS & NIHSS q 2 hours x 12 hours then q 4 hours.   Motion artifact on cta, plan to give patient some pain medication and rescan.  Bedside handoff with ED RN [facility].    Elvin Portland  Stroke Response RN

## 2024-07-28 ENCOUNTER — Inpatient Hospital Stay (HOSPITAL_COMMUNITY)

## 2024-07-28 DIAGNOSIS — R29704 NIHSS score 4: Secondary | ICD-10-CM

## 2024-07-28 DIAGNOSIS — I69391 Dysphagia following cerebral infarction: Secondary | ICD-10-CM

## 2024-07-28 DIAGNOSIS — R569 Unspecified convulsions: Secondary | ICD-10-CM

## 2024-07-28 DIAGNOSIS — G936 Cerebral edema: Secondary | ICD-10-CM

## 2024-07-28 DIAGNOSIS — I63512 Cerebral infarction due to unspecified occlusion or stenosis of left middle cerebral artery: Secondary | ICD-10-CM

## 2024-07-28 LAB — CBC
HCT: 24.4 % — ABNORMAL LOW (ref 36.0–46.0)
Hemoglobin: 7.9 g/dL — ABNORMAL LOW (ref 12.0–15.0)
MCH: 24.1 pg — ABNORMAL LOW (ref 26.0–34.0)
MCHC: 32.4 g/dL (ref 30.0–36.0)
MCV: 74.4 fL — ABNORMAL LOW (ref 80.0–100.0)
Platelets: 187 K/uL (ref 150–400)
RBC: 3.28 MIL/uL — ABNORMAL LOW (ref 3.87–5.11)
RDW: 16.2 % — ABNORMAL HIGH (ref 11.5–15.5)
WBC: 6.1 K/uL (ref 4.0–10.5)
nRBC: 0 % (ref 0.0–0.2)

## 2024-07-28 LAB — HEMOGLOBIN A1C
Hgb A1c MFr Bld: 5.3 % (ref 4.8–5.6)
Mean Plasma Glucose: 105.41 mg/dL

## 2024-07-28 LAB — LIPID PANEL
Cholesterol: 134 mg/dL (ref 0–200)
HDL: 49 mg/dL (ref >40)
LDL Cholesterol: 73 mg/dL (ref 0–99)
Total CHOL/HDL Ratio: 2.7 ratio
Triglycerides: 62 mg/dL (ref <150)
VLDL: 12 mg/dL (ref 0–40)

## 2024-07-28 LAB — CREATININE, SERUM
Creatinine, Ser: 1.16 mg/dL — ABNORMAL HIGH (ref 0.44–1.00)
GFR, Estimated: 49 mL/min — ABNORMAL LOW (ref >60)

## 2024-07-28 MED ORDER — HYDRALAZINE HCL 50 MG PO TABS
100.0000 mg | ORAL_TABLET | Freq: Two times a day (BID) | ORAL | Status: DC
Start: 2024-07-28 — End: 2024-07-29
  Administered 2024-07-28: 100 mg via ORAL
  Filled 2024-07-28: qty 2

## 2024-07-28 MED ORDER — HYDRALAZINE HCL 20 MG/ML IJ SOLN: 10.0000 mg | Freq: Four times a day (QID) | INTRAMUSCULAR | Status: DC | PRN

## 2024-07-28 MED ORDER — LORAZEPAM 2 MG/ML IJ SOLN
0.5000 mg | Freq: Once | INTRAMUSCULAR | Status: AC | PRN
  Administered 2024-07-28: 0.5 mg via INTRAVENOUS
  Filled 2024-07-28: qty 1

## 2024-07-28 MED ORDER — HYDRALAZINE HCL 20 MG/ML IJ SOLN
10.0000 mg | Freq: Four times a day (QID) | INTRAMUSCULAR | Status: DC | PRN
  Administered 2024-07-28 – 2024-07-31 (×3): 10 mg via INTRAVENOUS
  Filled 2024-07-28 (×3): qty 1

## 2024-07-28 MED ORDER — QUETIAPINE FUMARATE 50 MG PO TABS
50.0000 mg | ORAL_TABLET | Freq: Every day | ORAL | Status: DC
  Administered 2024-07-28 – 2024-07-31 (×4): 50 mg via ORAL
  Filled 2024-07-28 (×4): qty 1

## 2024-07-28 MED ORDER — ISOSORB DINITRATE-HYDRALAZINE 20-37.5 MG PO TABS
1.0000 | ORAL_TABLET | Freq: Three times a day (TID) | ORAL | Status: DC
  Administered 2024-07-28 – 2024-08-01 (×12): 1 via ORAL
  Filled 2024-07-28 (×12): qty 1

## 2024-07-28 MED ORDER — DULOXETINE HCL 30 MG PO CPEP
30.0000 mg | ORAL_CAPSULE | Freq: Every day | ORAL | Status: DC
  Administered 2024-07-28 – 2024-08-01 (×5): 30 mg via ORAL
  Filled 2024-07-28 (×5): qty 1

## 2024-07-28 NOTE — Progress Notes (Signed)
 Inpatient Rehab Admissions Coordinator Note:   Per therapy patient was screened for CIR candidacy by Forrest Jaroszewski SHAUNNA Yvone Cohens, CCC-SLP. At this time, pt appears to be a potential candidate for CIR. I will place an order for rehab consult for full assessment, per our protocol.  Please contact me any with questions.SABRA Tinnie Yvone Cohens, MS, CCC-SLP Admissions Coordinator [phone] 07/28/24 5:08 PM

## 2024-07-28 NOTE — Procedures (Signed)
 Patient Name: Christie Meza  MRN: [id number]  Epilepsy Attending: Arlin MALVA Krebs  Referring Physician/Provider: Rosemarie Eather RAMAN, MD  Date: 07/28/2024 Duration: 22.30 mins  Patient history: 78 y.o. female with hx of bipolar 1 diorder, HTN and obesity, who presents to the ED from home via EMS as a Code Stroke, after family noted that she suddenly could not talk.  EEG to evaluate for seizure  Level of alertness: Awake, asleep  AEDs during EEG study: None  Technical aspects: This EEG study was done with scalp electrodes positioned according to the 10-20 International system of electrode placement. Electrical activity was reviewed with band pass filter of 1-70Hz , sensitivity of 7 uV/mm, display speed of 67mm/sec with a 60Hz  notched filter applied as appropriate. EEG data were recorded continuously and digitally stored.  Video monitoring was available and reviewed as appropriate.  Description: The posterior dominant rhythm consists of 8-9 Hz activity of moderate voltage (25-35 uV) seen predominantly in posterior head regions, symmetric and reactive to eye opening and eye closing. Sleep was characterized by vertex waves, sleep spindles (12 to 14 Hz), maximal frontocentral region.  There is continuous sharply contoured 3 to 6 Hz theta-delta slowing in left fronto-temporo-parietal region. Hyperventilation and photic stimulation were not performed.     ABNORMALITY - Continuous slow, left fronto-temporo-parietal region  IMPRESSION: This study is suggestive of cortical dysfunction arising from left fronto-temporo-parietal region likely secondary to underlying structural abnormality. No seizures or definite epileptiform discharges were seen throughout the recording.  If suspicion for interictal activity remains a concern, a prolonged study including sleep can be considered.   Kamilia Carollo O Kavonte Bearse

## 2024-07-28 NOTE — Progress Notes (Signed)
 EEG complete - results pending

## 2024-07-28 NOTE — Evaluation (Addendum)
 Physical Therapy Evaluation Patient Details Name: Christie Meza MRN: [id number] DOB: [birth year] Today's Date: 07/28/2024  History of Present Illness  78 y.o. female presents to [facility] 07/27/24 with R sided facial droop and slurred speech. 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 PMHx: HTN, bipolar disorder, obesity, chronic pain syndrome, alcohol abuse, urine incontinence   Clinical Impression  Unclear PLOF or home-set up as pt was a questionable historian with no family available to confirm. Pt reported ambulating with a SP cane at baseline, however, prior chart review from 2023 stated pt has assist for transfers. Pt had difficulty communicating and would either respond yes or no or not respond at all. Appeared to have R>L weakness, however, unable to formally MMT due to cognition. ModAx2 to MaxAx2 for bed mobility and ModAx2 to stand with [facility]. Pt was able to take lateral steps towards [facility] with increased time and effort. Deferred transfer to recliner due to impaired cognition. At this time, recommending >3hrs post acute rehab to maximize rehab potential. Acute PT to follow.       If plan is discharge home, recommend the following: A lot of help with walking and/or transfers;A lot of help with bathing/dressing/bathroom;Assistance with cooking/housework;Direct supervision/assist for medications management;Direct supervision/assist for financial management;Assist for transportation;Help with stairs or ramp for entrance   Can travel by private vehicle    No    Equipment Recommendations Other (comment) (TBA)  Recommendations for Other Services  Rehab consult    Functional Status Assessment Patient has had a recent decline in their functional status and demonstrates the ability to make significant improvements in function in a reasonable and predictable  amount of time.     Precautions / Restrictions Precautions Precautions: Fall Recall of Precautions/Restrictions: Impaired Restrictions Weight Bearing Restrictions Per Provider Order: No      Mobility  Bed Mobility Overal bed mobility: Needs Assistance Bed Mobility: Supine to Sit, Sit to Supine    Supine to sit: Mod assist, +2 for physical assistance, +2 for safety/equipment Sit to supine: Max assist, +2 for physical assistance, +2 for safety/equipment   General bed mobility comments: cues for sequencing, able to move LLE towards EOB with increased assist to bring R LE off    Transfers Overall transfer level: Needs assistance Equipment used: 2 person hand held assist Transfers: Sit to/from Stand Sit to Stand: Mod assist, +2 safety/equipment, +2 physical assistance    General transfer comment: ModAx2 with 2HH to stand and take lateral side-steps. Increased time and effort to take steps    Ambulation/Gait  General Gait Details: deferred  Modified Rankin (Stroke Patients Only) Modified Rankin (Stroke Patients Only) Pre-Morbid Rankin Score: Moderately severe disability Modified Rankin: Severe disability     Balance Overall balance assessment: Needs assistance Sitting-balance support: No upper extremity supported, Feet supported Sitting balance-Leahy Scale: Poor Sitting balance - Comments: reliant on MinA to CGA for posterior lean Postural control: Posterior lean Standing balance support: Bilateral upper extremity supported, During functional activity, Reliant on assistive device for balance Standing balance-Leahy Scale: Poor Standing balance comment: reliant on UE and external support       Pertinent Vitals/Pain Pain Assessment Pain Assessment: Faces Faces Pain Scale: Hurts a little bit Pain Location: unable to clarify location, pain with movement Pain Descriptors / Indicators: Aching, Discomfort Pain Intervention(s): Limited activity within patient's tolerance,  Monitored during session, Repositioned    Home Living Family/patient expects to  be discharged to:: Private residence Living Arrangements: Spouse/significant other Available Help at Discharge: Family Type of Home: House Home Access: Level entry    Home Layout: One level Home Equipment: Production assistant, radio - single point Additional Comments: Unclear if accurate home set-up    Prior Function Prior Level of Function : Independent/Modified Independent;Patient poor historian/Family not available      Mobility Comments: Pt reports being ModI with SP cane, unclear if accurate PLOF. Chart review from 2023 stated pt had assist for transfers ADLs Comments: Pt reports being Ind with ADLs and iADLs. Unclear if accurate PLOF. Chart review in 2023 stated pt had assist for ADLs. Does not drive     Extremity/Trunk Assessment   Upper Extremity Assessment Upper Extremity Assessment: Defer to OT evaluation    Lower Extremity Assessment Lower Extremity Assessment: Difficult to assess due to impaired cognition (Unable to follow commands for MMT, appeared R>L LE weakness)       Communication   Communication Communication: Impaired Factors Affecting Communication: Difficulty expressing self;Reduced clarity of speech    Cognition Arousal: Alert Behavior During Therapy: Flat affect   PT - Cognitive impairments: No family/caregiver present to determine baseline, Orientation, Awareness, Memory, Attention, Initiation, Sequencing, Problem solving, Safety/Judgement   Orientation impairments: Place, Time, Situation    PT - Cognition Comments: Pt would mostly respond with yes or no to questions, would occasionally not respond to questions or commands Following commands: Impaired Following commands impaired: Follows one step commands inconsistently, Follows one step commands with increased time     Cueing Cueing Techniques: Verbal cues, Tactile cues     General Comments General comments (skin  integrity, edema, etc.): VSS on RA     PT Assessment Patient needs continued PT services  PT Problem List Decreased strength;Decreased activity tolerance;Decreased balance;Decreased mobility;Decreased coordination;Decreased cognition;Decreased knowledge of use of DME;Decreased safety awareness       PT Treatment Interventions DME instruction;Gait training;Functional mobility training;Therapeutic activities;Therapeutic exercise;Balance training;Neuromuscular re-education;Patient/family education;Wheelchair mobility training    PT Goals (Current goals can be found in the Care Plan section)  Acute Rehab PT Goals Patient Stated Goal: to go home PT Goal Formulation: With patient Time For Goal Achievement: 08/11/24 Potential to Achieve Goals: Good    Frequency Min 2X/week     Co-evaluation PT/OT/SLP Co-Evaluation/Treatment: Yes Reason for Co-Treatment: For patient/therapist safety;To address functional/ADL transfers;Necessary to address cognition/behavior during functional activity PT goals addressed during session: Balance;Mobility/safety with mobility         AM-PAC PT 6 Clicks Mobility  Outcome Measure Help needed turning from your back to your side while in a flat bed without using bedrails?: Total Help needed moving from lying on your back to sitting on the side of a flat bed without using bedrails?: Total Help needed moving to and from a bed to a chair (including a wheelchair)?: Total Help needed standing up from a chair using your arms (e.g., wheelchair or bedside chair)?: Total Help needed to walk in hospital room?: Total Help needed climbing 3-5 steps with a railing? : Total 6 Click Score: 6    End of Session Equipment Utilized During Treatment: Gait belt Activity Tolerance: Patient tolerated treatment well Patient left: in bed;with call bell/phone within reach;with bed alarm set Nurse Communication: Mobility status PT Visit Diagnosis: Unsteadiness on feet  (R26.81);Other abnormalities of gait and mobility (R26.89);Muscle weakness (generalized) (M62.81)    Time: 9198-9179 PT Time Calculation (min) (ACUTE ONLY): 19 min   Charges:   PT Evaluation $PT Eval Moderate Complexity: 1  Mod   PT General Charges $$ ACUTE PT VISIT: 1 Visit       Kate ORN, PT, DPT Secure Chat Preferred  Rehab Office [phone]  Kate BRAVO Wendolyn 07/28/2024, 10:29 AM

## 2024-07-28 NOTE — Progress Notes (Signed)
 PROGRESS NOTE    Christie Meza  FMW:[id number] DOB: November 06, 1945 DOA: 07/27/2024 PCP: Shelda Atlas, MD     Brief Narrative:  Christie Meza is a 78 y.o. female with medical history significant of essential hypertension, hyperlipidemia, bipolar disorder, chronic pain syndrome, history of alcohol abuse, who presented to the ER via EMS with right-sided facial droop and some slurred speech.  Patient apparently was last known normal about 7:00 last night according to daughter.  About 1:30 PM this afternoon she called the daughter telling her that she is on the road to speak well.  Not sure of the timing.  When EMS arrive she did have a right facial droop and bilateral leg weakness.  There was global aphasia and dysarthria.  Patient was deemed not a candidate for tPA administration due to unknown duration and last known normal.  She has been seen by neurology and patient had initial head CT that suggested a small CVA In the mid to posterior left frontal lobe.   New events last 24 hours / Subjective: Patient arousable to voice and moves all extremities spontaneously.  Exhibits some expressive aphasia  Assessment & Plan:   Principal Problem:   CVA (cerebral vascular accident) (HCC) Active Problems:   Recurrent falls   Urinary incontinence   Obesity, Class III, BMI 40-49.9 (morbid obesity) (HCC)   Bipolar 1 disorder (HCC)   Hypertension   Alcohol abuse   Acute left MCA CVA - MRI brain showed acute left MCA infarction  - MRA head showed left MCA occlusion  - EEG  suggestive of cortical dysfunction arising from left fronto-temporo-parietal region likely secondary to underlying structural abnormality. No seizures or definite epileptiform discharges were seen throughout the recording.  - Echocardiogram pending - Plavix - PT OT SLP - Stroke team following  Hyperlipidemia - Lipitor   Hypertension - Allowing permissive hypertension  Bipolar disorder  Obesity Estimated body mass index  is 38.89 kg/m as calculated from the following:   Height as of 02/24/22: 5' 5 (1.651 m).   Weight as of this encounter: 106 kg.     DVT prophylaxis:  enoxaparin  (LOVENOX ) injection 40 mg Start: 07/28/24 1000  Code Status: Full code Family Communication: None at bedside Disposition Plan: CIR Status is: Inpatient Remains inpatient appropriate because: PT has recommended CIR consideration    Antimicrobials:  Anti-infectives (From admission, onward)    None        Objective: Vitals:   07/28/24 0031 07/28/24 0330 07/28/24 1006 07/28/24 1210  BP: (!) 210/66 (!) 186/88 (!) 156/88 (!) 167/78  Pulse:  90 84 83  Resp: 17 19 (!) 22 13  Temp:  100 F (37.8 C) 98.4 F (36.9 C) 98.8 F (37.1 C)  TempSrc:  Oral Oral Oral  SpO2:  100% 100% 99%  Weight:        Intake/Output Summary (Last 24 hours) at 07/28/2024 1327 Last data filed at 07/28/2024 1210 Gross per 24 hour  Intake 898.12 ml  Output 1000 ml  Net -101.88 ml   Filed Weights   07/27/24 1500  Weight: 106 kg    Examination:  General exam: Appears calm and comfortable  Respiratory system: Clear to auscultation. Respiratory effort normal. Cardiovascular system: S1 & S2 heard, RRR. Gastrointestinal system: Abdomen is nondistended, soft  Central nervous system: Alert and oriented. +dysarthric speech, moves all extremities spontaneously Extremities: Symmetric in appearance   Data Reviewed: I have personally reviewed following labs and imaging studies  CBC: Recent Labs  Lab 07/27/24  1503 07/27/24 1505 07/27/24 2321  WBC 5.3  --  6.1  NEUTROABS 3.2  --   --   HGB 8.1* 8.5* 7.9*  HCT 25.5* 25.0* 24.4*  MCV 75.0*  --  74.4*  PLT 194  --  187   Basic Metabolic Panel: Recent Labs  Lab 07/27/24 1503 07/27/24 1505 07/27/24 2321  NA 140 143  --   K 3.9 4.0  --   CL 111 110  --   CO2 22  --   --   GLUCOSE 104* 103*  --   BUN 13 13  --   CREATININE 1.54* 1.60* 1.16*  CALCIUM 10.7*  --   --    GFR: CrCl  cannot be calculated (Unknown ideal weight.). Liver Function Tests: Recent Labs  Lab 07/27/24 1503  AST 22  ALT 11  ALKPHOS 149*  BILITOT 0.7  PROT 6.7  ALBUMIN 3.6   No results for input(s): LIPASE, AMYLASE in the last 168 hours. No results for input(s): AMMONIA in the last 168 hours. Coagulation Profile: Recent Labs  Lab 07/27/24 1503  INR 1.0   Cardiac Enzymes: No results for input(s): CKTOTAL, CKMB, CKMBINDEX, TROPONINI in the last 168 hours. BNP (last 3 results) No results for input(s): PROBNP in the last 8760 hours. HbA1C: Recent Labs    07/27/24 2321  HGBA1C 5.3   CBG: Recent Labs  Lab 07/27/24 1459  GLUCAP 106*   Lipid Profile: Recent Labs    07/27/24 2321  CHOL 134  HDL 49  LDLCALC 73  TRIG 62  CHOLHDL 2.7   Thyroid  Function Tests: No results for input(s): TSH, T4TOTAL, FREET4, T3FREE, THYROIDAB in the last 72 hours. Anemia Panel: No results for input(s): VITAMINB12, FOLATE, FERRITIN, TIBC, IRON, RETICCTPCT in the last 72 hours. Sepsis Labs: No results for input(s): PROCALCITON, LATICACIDVEN in the last 168 hours.  No results found for this or any previous visit (from the past 240 hours).    Radiology Studies: MR Angio Head WO CONTRAST Addendum Date: 07/28/2024 ADDENDUM REPORT: 07/28/2024 10:46 ADDENDUM: Impression #2 called by telephone at the time of interpretation on 07/28/2024 at 10:43 am to provider Dr. Rojelio, who verbally acknowledged these results. Electronically Signed   By: Rockey Childs D.O.   On: 07/28/2024 10:46   Result Date: 07/28/2024 CLINICAL DATA:  Provided history: Neuro deficit, acute, stroke suspected. EXAM: MRA HEAD WITHOUT CONTRAST TECHNIQUE: Angiographic images of the Circle of Willis were acquired using MRA technique without intravenous contrast. COMPARISON:  Same-day brain MRI 07/28/2024. Non-contrast head CT and CT angiogram head/neck 07/27/2024. FINDINGS: Anterior circulation: The  intracranial internal carotid arteries are patent. Mild atherosclerotic irregularity of both vessels. The left middle cerebral artery M1 segment is occluded at its mid-to-distal aspect. There is some flow related signal within M2 and more distal left MCA branches, likely due to collateral flow. The right middle cerebral artery M1 segment is patent. No right M2 proximal branch occlusion or high-grade proximal stenosis is identified. The anterior cerebral arteries are patent. 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 Posterior circulation: The proximal intracranial vertebral arteries, and proximal right posterior inferior cerebellar artery, are excluded from the field of view. The visible intracranial vertebral arteries are patent. The basilar artery is patent. The posterior cerebral arteries are patent. Redemonstrated moderate stenosis within the right posterior cerebral artery P2 segment. A right posterior communicating artery is present. The left posterior communicating artery is diminutive or absent. Anatomic variants: As  described. Attempts are being made to reach the ordering provider at this time. IMPRESSION: 1. The proximal intracranial vertebral arteries, and proximal right posterior inferior cerebellar artery (PICA), are excluded from the field of view. Within this limitation, findings are as follows. 2. The left middle cerebral artery M1 segment is occluded at its mid-to-distal aspect. 3. Redemonstrated moderate stenosis within the right posterior cerebral artery P2 segment. 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 Electronically Signed: By: Rockey Childs D.O. On: 07/28/2024 10:15   EEG adult Result Date: 07/28/2024 Shelton Arlin KIDD, MD     07/28/2024 10:43 AM Patient Name: Christie Meza MRN: [id number] Epilepsy Attending: Arlin KIDD Shelton Referring Physician/Provider: Rosemarie Eather RAMAN, MD Date: 07/28/2024  Duration: 22.30 mins Patient history: 78 y.o. female with hx of bipolar 1 diorder, HTN and obesity, who presents to the ED from home via EMS as a Code Stroke, after family noted that she suddenly could not talk.  EEG to evaluate for seizure Level of alertness: Awake, asleep AEDs during EEG study: None Technical aspects: This EEG study was done with scalp electrodes positioned according to the 10-20 International system of electrode placement. Electrical activity was reviewed with band pass filter of 1-70Hz , sensitivity of 7 uV/mm, display speed of 60mm/sec with a 60Hz  notched filter applied as appropriate. EEG data were recorded continuously and digitally stored.  Video monitoring was available and reviewed as appropriate. Description: The posterior dominant rhythm consists of 8-9 Hz activity of moderate voltage (25-35 uV) seen predominantly in posterior head regions, symmetric and reactive to eye opening and eye closing. Sleep was characterized by vertex waves, sleep spindles (12 to 14 Hz), maximal frontocentral region.  There is continuous sharply contoured 3 to 6 Hz theta-delta slowing in left fronto-temporo-parietal region. Hyperventilation and photic stimulation were not performed.   ABNORMALITY - Continuous slow, left fronto-temporo-parietal region IMPRESSION: This study is suggestive of cortical dysfunction arising from left fronto-temporo-parietal region likely secondary to underlying structural abnormality. No seizures or definite epileptiform discharges were seen throughout the recording. If suspicion for interictal activity remains a concern, a prolonged study including sleep can be considered. Arlin KIDD Shelton   MR Brain Wo Contrast (neuro protocol) Result Date: 07/28/2024 EXAM: MRI BRAIN WITHOUT CONTRAST 07/28/2024 04:55:37 AM TECHNIQUE: Multiplanar multisequence MRI of the head/brain was performed without the administration of intravenous contrast. BLADE sequences were utilized to compensate for  patient motion. The study is mildly degraded by patient motion. COMPARISON: CT of the head dated 07/27/2024 and MRI of the head dated 02/23/2022. CLINICAL HISTORY: Neuro deficit, acute, stroke suspected. Patient confused, not able to hold still. BLADE sequences utilized to compensate for motion. FINDINGS: BRAIN AND VENTRICLES: There is restricted diffusion within the cortex of the left frontal, parietal and temporal lobes compatible with acute non-hemorrhagic MCA distribution infarction. There is subcortical edema with mild cerebral swelling but no significant mass effect or midline shift. There is mild-to-moderate subcortical cerebral white matter disease. No intracranial hemorrhage. No mass. No midline shift. No hydrocephalus. The sella is unremarkable. Normal flow voids. ORBITS: No acute abnormality. SINUSES AND MASTOIDS: No acute abnormality. BONES AND SOFT TISSUES: Normal marrow signal. No acute soft tissue abnormality. IMPRESSION: 1. Acute non-hemorrhagic left MCA territory infarction involving the frontal, parietal, and temporal lobes with associated subcortical edema and mild cerebral swelling. No significant mass effect or midline shift. 2. Mild-to-moderate subcortical white matter disease. 3. Mild motion-related image degradation. Electronically signed by: Evalene Coho MD 07/28/2024 05:06 AM EDT  RP Workstation: [id number]   CT ANGIO HEAD NECK W WO CM (CODE STROKE) Addendum Date: 07/27/2024 ADDENDUM REPORT: 07/27/2024 16:34 ADDENDUM: Due to motion on the initial acquisition, a repeat scan was performed. Unchanged appearance of the M1 and more distal left middle cerebral artery vessels. 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 Beyond this, there is apparent occlusion of multiple left middle cerebral artery branches (beginning at the proximal M2 segment level). These results were called by telephone at the time of interpretation on  07/27/2024 at 4:25 pm to provider ERIC [facility] , who verbally acknowledged these results. The previously described stenosis of the paraclinoid left ICA appears moderate-to-severe on the most recent CTA acquisition. The previously described stenosis within the right posterior cerebral artery P2 segment appears severe on the most recent CTA acquisition. Electronically Signed   By: Rockey Childs D.O.   On: 07/27/2024 16:34   Result Date: 07/27/2024 CLINICAL DATA:  Provided history: Neuro deficit, acute, stroke suspected. EXAM: CT ANGIOGRAPHY HEAD AND NECK WITH AND WITHOUT CONTRAST TECHNIQUE: Multidetector CT imaging of the head and neck was performed using the standard protocol during bolus administration of intravenous contrast. Multiplanar CT image reconstructions and MIPs were obtained to evaluate the vascular anatomy. Carotid stenosis measurements (when applicable) are obtained utilizing NASCET criteria, using the distal internal carotid diameter as the denominator. RADIATION DOSE REDUCTION: This exam was performed according to the departmental dose-optimization program which includes automated exposure control, adjustment of the mA and/or kV according to patient size and/or use of iterative reconstruction technique. CONTRAST:  Administered contrast not known at this time. COMPARISON:  Noncontrast head CT performed earlier today 07/27/2024. FINDINGS: CTA NECK FINDINGS Aortic arch: Standard aortic branching. Minimal atherosclerotic plaque within the aortic arch. Streak/beam hardening artifact arising from a dense contrast bolus partially obscures the left subclavian artery. Within this limitation, there is no appreciable hemodynamically significant innominate or proximal subclavian artery stenosis. Right carotid system: CCA and ICA patent within the neck without stenosis or significant atherosclerotic disease. Partially retropharyngeal course of the cervical ICA. Left carotid system: CCA and ICA patent within the  neck without stenosis. Minimal atherosclerotic plaque about the carotid bifurcation. Vertebral arteries: Venous reflux of contrast partially obscures the left vertebral artery proximal V1 segment. Within this limitation, the vertebral arteries are codominant and patent within the neck without stenosis or significant aspect disease. Skeleton: Prior C3-C5 ACDF. Cervical and thoracic spondylosis. No acute fracture or aggressive osseous lesion. Other neck: Multiple thyroid  nodules (the largest an exophytic nodule projecting inferiorly from the lower pole of the right thyroid  lobe measuring 3 cm). This nodule extends into the superior mediastinum. Upper chest: No consolidation within the imaged lung apices. Review of the MIP images confirms the above findings CTA HEAD FINDINGS Moderately motion degraded examination. Within this limitation, findings are as follows. Anterior circulation: The intracranial internal carotid arteries are patent. Atherosclerotic plaque within both vessels. Most notably, there is up to moderate stenosis of the paraclinoid ICAs, bilaterally. Occlusion versus severe near-occlusive stenosis within the left middle cerebral artery mid-to-distal M1 segment (for instance as seen on series 10, image 19). Beyond this, multiple left MCA branches are poorly delineated (beginning at the proximal M2 segment level) and are likely occluded. The right middle cerebral artery M1 segment is patent. No right M2 proximal branch occlusion or high-grade proximal stenosis is identified. The anterior cerebral arteries are patent. No intracranial aneurysm is identified. Posterior circulation: The intracranial vertebral arteries  are patent. The basilar artery is patent. The posterior cerebral arteries are patent. Moderate stenosis within the right posterior cerebral artery P2 segment. Posterior communicating arteries are diminutive or absent, bilaterally. Venous sinuses: Within the limitations of contrast timing, no  convincing thrombus. Anatomic variants: As described. Review of the MIP images confirms the above findings CTA head impressiopn #2 called by telephone at the time of interpretation on 07/27/2024 at 3:35 pm to provider ERIC [place] , who verbally acknowledged these results. IMPRESSION: CTA neck: 1. The common carotid and internal carotid arteries are patent within the neck without stenosis. Minimal atherosclerotic plaque on the left. 2. Venous reflux of contrast partially obscures the left vertebral artery proximal V1 segment. Within this limitation, the vertebral arteries are patent within the neck without stenosis or significant atherosclerotic disease. 3. Aortic Atherosclerosis (ICD10-I70.0). 4. 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 A nonemergent thyroid  ultrasound is recommended for further evaluation. Reference: J Am Coll Radiol. 2015 Feb;12(2): 143-50. CTA head: 1. Moderately motion degraded examination. Within this limitation, findings are as follows. 2. Occlusion versus severe-near occlusive stenosis of the left middle cerebral artery mid-to-distal M1 segment. Beyond this, multiple left MCA branches are also poorly delineated (beginning at the proximal M2 segment level) and are likely occluded. 3. Background intracranial atherosclerotic disease as described within the body of the report. Up to moderate stenosis of the paraclinoid internal carotid arteries, bilaterally. Moderate stenosis within the right posterior cerebral artery P2 segment. Electronically Signed: By: Rockey Childs D.O. On: 07/27/2024 15:51   CT HEAD CODE STROKE WO CONTRAST Result Date: 07/27/2024 CLINICAL DATA:  Code stroke. Provided history: Neuro deficit, acute, stroke suspected. EXAM: CT HEAD WITHOUT CONTRAST TECHNIQUE: Contiguous axial images were obtained from the base of the skull through the vertex without intravenous contrast. RADIATION DOSE REDUCTION: This exam was performed according to the departmental dose-optimization  program which includes automated exposure control, adjustment of the mA and/or kV according to patient size and/or use of iterative reconstruction technique. COMPARISON:  Brain MRI 02/23/2022.  Head CT 02/23/2022. FINDINGS: Brain: Generalized cerebral atrophy. A cortical/subcortical infarct within the left parietal lobe has progressed in extent as compared to the brain MRI of 02/23/2022. Small age-indeterminate cortical infarct within the mid to posterior left frontal lobe, new from the prior MRI (series 5, image 37) (series 2, image 23). Background mild patchy and ill-defined hypoattenuation within the cerebral white matter, nonspecific but compatible with chronic small vessel image disease. There is no acute intracranial hemorrhage. No extra-axial fluid collection. No evidence of an intracranial mass. No midline shift. Vascular: No hyperdense vessel.  Atherosclerotic calcifications. Skull: No calvarial fracture or aggressive osseous lesion. Sinuses/Orbits: No mass or acute finding within the imaged orbits. Trace consult thickening within the bilateral ethmoid sinuses. ASPECTS [facility] Stroke Program Early CT Score) - Ganglionic level infarction (caudate, lentiform nuclei, internal capsule, insula, M1-M3 cortex): 6 - Supraganglionic infarction (M4-M6 cortex): 1 Total score (0-10 with 10 being normal): 7 Age-indeterminate infarcts within the left frontal and left parietal lobes. These results were communicated to Dr. Lindzen at 3:19 pmon 10/4/2025by text page via the [facility] messaging system. IMPRESSION: 1. Small age-indeterminate cortical infarct within the mid-to-posterior left frontal lobe, new from the prior brain MRI of 02/23/2022. Additionally, a left parietal lobe cortical/subcortical infarct has increased in extent raising the possibility of an acute on chronic infarct at this site. Consider a brain MRI for further evaluation. ASPECTS is 7. 2. Background parenchymal atrophy and chronic small vessel ischemic  disease.  Electronically Signed   By: Rockey Childs D.O.   On: 07/27/2024 15:20      Scheduled Meds:   stroke: early stages of recovery book   Does not apply Once   atorvastatin  40 mg Oral Daily   clopidogrel  75 mg Oral Daily   enoxaparin  (LOVENOX ) injection  40 mg Subcutaneous Daily   Continuous Infusions:  sodium chloride  75 mL/hr at 07/28/24 0000   sodium chloride  40 mL/hr at 07/28/24 0029     LOS: 1 day   Time spent: 40 minutes   Delon Hoe, DO [facility] 07/28/2024, 1:27 PM   Available via Epic secure chat 7am-7pm After these hours, please refer to coverage provider listed on [email or web address]

## 2024-07-28 NOTE — Progress Notes (Signed)
 In room with Pt.  Pt alert and oriented x 4.  Tele showed a 6 beat run tachycardia (rate 200- 230's bpm), not sustained.  Pt asymptomatic; denies chest pain, SOB, or dizziness.  BP: 107/46.  Other VSS. Rhythm strips saved. Press photographer and physician notified.  Labs reviewed: K+ 4.0; Ca 1.38; no current labs for mag or phosphorus results available. Provider order mag level STAT.

## 2024-07-28 NOTE — Progress Notes (Signed)
 SLP Cancellation Note  Patient Details Name: KYRIN GARN MRN: [id number] DOB: 1946/04/02   Cancelled treatment:       Reason Eval/Treat Not Completed: Patient at procedure or test/unavailable; will continue efforts for completion of clinical swallow evaluation. If pt has urgent PO meds may attempt crushed in puree.    Mitzie HUNT MA, CCC-SLP Acute Rehabilitation Services   07/28/2024, 9:16 AM

## 2024-07-28 NOTE — Evaluation (Signed)
 Occupational Therapy Evaluation Patient Details Name: Christie Meza MRN: [id number] DOB: 1946/02/22 Today's Date: 07/28/2024   History of Present Illness   78 y.o. female presents to [facility] 07/27/24 with R sided facial droop and slurred speech. 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 PMHx: HTN, bipolar disorder, obesity, chronic pain syndrome, alcohol abuse, urine incontinence     Clinical Impressions Pt ind at baseline with ADL and used cane for mobility, per pt she lives with spouse, however prior notes state pt lives with grandson. Pt unclear historian. Notable expressive difficulties, impaired cognition with poor command follow. Pt needing min-max A for ADLs, mod-max+2 for bed mobility and mod +2 for standing/taking 2-3 side steps to L toward HOB. Pt with R > L UE weakness, does not grasp therapist's hand with R, however difficulty performing full assessment 2/2 impaired cognition. Pt presenting with impairments listed below, will follow acutely. Patient will benefit from intensive inpatient follow-up therapy, >3 hours/day.       If plan is discharge home, recommend the following:   Two people to help with walking and/or transfers;A lot of help with bathing/dressing/bathroom;Assistance with cooking/housework;Direct supervision/assist for financial management;Direct supervision/assist for medications management;Assist for transportation;Help with stairs or ramp for entrance     Functional Status Assessment   Patient has had a recent decline in their functional status and demonstrates the ability to make significant improvements in function in a reasonable and predictable amount of time.     Equipment Recommendations   Other (comment) (defer)     Recommendations for Other Services   PT consult;Rehab consult      Precautions/Restrictions   Precautions Precautions: Fall Recall of Precautions/Restrictions: Impaired     Mobility Bed Mobility Overal bed mobility: Needs Assistance Bed Mobility: Supine to Sit, Sit to Supine     Supine to sit: Mod assist, +2 for physical assistance, +2 for safety/equipment Sit to supine: Max assist, +2 for physical assistance, +2 for safety/equipment        Transfers Overall transfer level: Needs assistance Equipment used: 2 person hand held assist Transfers: Sit to/from Stand Sit to Stand: Mod assist, +2 safety/equipment, +2 physical assistance                  Balance Overall balance assessment: Needs assistance Sitting-balance support: No upper extremity supported, Feet supported Sitting balance-Leahy Scale: Poor Sitting balance - Comments: reliant on MinA to CGA for posterior lean Postural control: Posterior lean Standing balance support: Bilateral upper extremity supported, During functional activity, Reliant on assistive device for balance Standing balance-Leahy Scale: Poor Standing balance comment: reliant on UE and external support                           ADL either performed or assessed with clinical judgement   ADL Overall ADL's : Needs assistance/impaired Eating/Feeding: Sitting;Minimal assistance   Grooming: Minimal assistance;Sitting   Upper Body Bathing: Moderate assistance;Sitting   Lower Body Bathing: Maximal assistance;Sitting/lateral leans   Upper Body Dressing : Moderate assistance;Sitting   Lower Body Dressing: Maximal assistance;Sitting/lateral leans   Toilet Transfer: Moderate assistance;+2 for physical assistance   Toileting- Clothing Manipulation and Hygiene: Maximal assistance       Functional mobility during ADLs: Moderate assistance;+2 for physical assistance       Vision   Additional Comments: will further assess     Perception Perception:  Not tested       Praxis Praxis: Not  tested       Pertinent Vitals/Pain Pain Assessment Pain Assessment: Faces Pain Score: 2  Faces Pain Scale: Hurts a little bit Pain Location: generalized with movement, pt hold chest and L shoulder Pain Descriptors / Indicators: Discomfort Pain Intervention(s): Limited activity within patient's tolerance, Monitored during session, Repositioned     Extremity/Trunk Assessment Upper Extremity Assessment Upper Extremity Assessment: RUE deficits/detail;Difficult to assess due to impaired cognition RUE Deficits / Details: 3/5 to ROM shoulder against gravity, does not follow command to squeeze hand RUE Coordination: decreased fine motor;decreased gross motor   Lower Extremity Assessment Lower Extremity Assessment: Difficult to assess due to impaired cognition   Cervical / Trunk Assessment Cervical / Trunk Assessment: Kyphotic   Communication Communication Communication: Impaired Factors Affecting Communication: Difficulty expressing self;Reduced clarity of speech   Cognition Arousal: Alert Behavior During Therapy: Flat affect Cognition: Cognition impaired   Orientation impairments: Situation, Time, Place Awareness: Online awareness impaired Memory impairment (select all impairments): Short-term memory Attention impairment (select first level of impairment): Sustained attention Executive functioning impairment (select all impairments): Initiation OT - Cognition Comments: states name, does not state she is at the hospital or why she is here, incr time for command follow                 Following commands: Impaired Following commands impaired: Follows one step commands inconsistently, Follows one step commands with increased time     Cueing  General Comments   Cueing Techniques: Verbal cues;Tactile cues  VSS on RA   Exercises     Shoulder Instructions      Home Living Family/patient expects to be discharged to:: Private residence Living Arrangements:  Spouse/significant other Available Help at Discharge: Family Type of Home: House Home Access: Level entry     Home Layout: One level     Bathroom Shower/Tub: Tub/shower unit         Home Equipment: Production assistant, radio - single point   Additional Comments: Unclear if accurate home set-up  Lives With: Other (Comment) (pt states spouse but unsure if reliable hx and novel aphasia (past notes state grandson assisted in 2023))    Prior Functioning/Environment Prior Level of Function : Independent/Modified Independent;Patient poor historian/Family not available             Mobility Comments: Pt reports being ModI with SP cane, unclear if accurate PLOF. Chart review from 2023 stated pt had assist for transfers ADLs Comments: Pt reports being Ind with ADLs and iADLs. Unclear if accurate PLOF. Chart review in 2023 stated pt had assist for ADLs. Does not drive    OT Problem List: Decreased strength;Decreased activity tolerance;Decreased range of motion;Impaired balance (sitting and/or standing);Decreased cognition;Decreased coordination;Decreased safety awareness;Decreased knowledge of precautions;Impaired UE functional use   OT Treatment/Interventions: Self-care/ADL training;Therapeutic exercise;Energy conservation;DME and/or AE instruction;Neuromuscular education;Therapeutic activities;Cognitive remediation/compensation;Visual/perceptual remediation/compensation;Patient/family education;Balance training      OT Goals(Current goals can be found in the care plan section)   Acute Rehab OT Goals Patient Stated Goal: did not state OT Goal Formulation: Patient unable to participate in goal setting Time For Goal Achievement: 08/11/24 Potential to Achieve Goals: Good ADL Goals Pt Will Perform Grooming: sitting;with contact guard assist Pt Will Perform Upper Body Dressing: with min assist;sitting Pt Will Perform Lower Body Dressing: with min assist;sitting/lateral leans;sit to/from  stand Pt Will Transfer to Toilet: with min assist;stand pivot transfer;bedside commode Pt Will Perform Tub/Shower Transfer: Tub transfer;Shower transfer;shower seat;Stand  pivot transfer;Squat pivot transfer;with min assist   OT Frequency:  Min 2X/week    Co-evaluation PT/OT/SLP Co-Evaluation/Treatment: Yes Reason for Co-Treatment: For patient/therapist safety;To address functional/ADL transfers;Necessary to address cognition/behavior during functional activity PT goals addressed during session: Balance;Mobility/safety with mobility OT goals addressed during session: Strengthening/ROM;ADL's and self-care      AM-PAC OT 6 Clicks Daily Activity     Outcome Measure Help from another person eating meals?: A Little Help from another person taking care of personal grooming?: A Little Help from another person toileting, which includes using toliet, bedpan, or urinal?: A Lot Help from another person bathing (including washing, rinsing, drying)?: A Lot Help from another person to put on and taking off regular upper body clothing?: A Lot Help from another person to put on and taking off regular lower body clothing?: A Lot 6 Click Score: 14   End of Session Nurse Communication: Mobility status  Activity Tolerance: Patient tolerated treatment well Patient left: in bed;with call bell/phone within reach;with bed alarm set  OT Visit Diagnosis: Other abnormalities of gait and mobility (R26.89);Unsteadiness on feet (R26.81);Muscle weakness (generalized) (M62.81)                Time: 9198-9179 OT Time Calculation (min): 19 min Charges:  OT General Charges $OT Visit: 1 Visit OT Evaluation $OT Eval Moderate Complexity: 1 Mod  Verlin Duke K, OTD, OTR/L SecureChat Preferred Acute Rehab [phone]   Katilin Raynes K Koonce 07/28/2024, 10:56 AM

## 2024-07-28 NOTE — Evaluation (Signed)
 Clinical/Bedside Swallow Evaluation Patient Details  Name: Christie Meza MRN: [id number] Date of Birth: [birth year]  Today's Date: 07/28/2024 Time: SLP Start Time (ACUTE ONLY): 1155 SLP Stop Time (ACUTE ONLY): 1225 SLP Time Calculation (min) (ACUTE ONLY): 30 min  Past Medical History:  Past Medical History:  Diagnosis Date   Bipolar 1 disorder (HCC)    Hypertension    Obesity    Past Surgical History:  Past Surgical History:  Procedure Laterality Date   ABDOMINAL HYSTERECTOMY  ~2002   ANTERIOR CERVICAL DECOMP/DISCECTOMY FUSION N/A 02/24/2022   Procedure: ANTERIOR CERVICAL THREE- FOUR AND CERVICAL FOUR THROUGH FIVE DECOMPRESSION/DISCECTOMY FUSION 2 LEVELS;  Surgeon: Colon Shove, MD;  Location: [facility] OR;  Service: Neurosurgery;  Laterality: N/A;   HPI:  Christie Meza with medical history significant of essential hypertension, hyperlipidemia, bipolar disorder, chronic pain syndrome, history of alcohol abuse, who presented to the ER via EMS today with right-sided facial droop and some slurred speech. 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 No significant mass effect or midline shift.    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 Mild right sided oral motor deficits noted including reduced right facial and lingual strength and sensation. Pt exhbiited some anterior loss of bolus during mastication of solid POs and right sided pocketing of solid POs. She exhibited some decreased sequencing as she was unable to consistely syphoon liquids via straw. Per palpation pt appeared to have delay in swallow initiation. No overt s/sx of aspiration were exhibited with POs but pt is at increased risk given deficits noted. Recommend dysphagia 1 (puree)  and thin liquids with meds whole in puree. SLP to closely follow for diet tolerance and advancement as well as speech language deficits noted on previous exam.  SLP Visit Diagnosis: Dysphagia, oral phase (R13.11);Dysphagia, unspecified (R13.10)    Aspiration Risk  Mild aspiration risk    Diet Recommendation   Thin;Dysphagia 1 (puree)  Medication Administration: Whole meds with puree    Other  Recommendations Oral Care Recommendations: Oral care BID     Assistance Recommended at Discharge    Functional Status Assessment Patient has had a recent decline in their functional status and demonstrates the ability to make significant improvements in function in a reasonable and predictable amount of time.  Frequency and Duration min 2x/week  1 week       Prognosis Prognosis for improved oropharyngeal function: Good Barriers to Reach Goals: Time post onset      Swallow Study   General Date of Onset: 07/27/24 HPI: Christie Meza with medical history significant of essential hypertension, hyperlipidemia, bipolar disorder, chronic pain syndrome, history of alcohol abuse, who presented to the ER via EMS today with right-sided facial droop and some slurred speech. 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 No significant mass effect or midline shift. Type of Study: Bedside Swallow Evaluation Previous Swallow Assessment: none on file Diet Prior to this Study: NPO Temperature Spikes Noted: No Respiratory Status: Room air History of Recent Intubation: No Behavior/Cognition: Alert (restless) Oral Cavity Assessment: Within Functional Limits Oral Care Completed by SLP: No Oral Cavity - Dentition: Missing dentition;Poor condition Vision: Functional for self-feeding Self-Feeding Abilities: Needs assist Patient Positioning: Upright  in bed Baseline Vocal Quality:  Low vocal intensity Volitional Cough: Strong Volitional Swallow: Able to elicit    Oral/Motor/Sensory Function Overall Oral Motor/Sensory Function: Mild impairment Facial ROM: Reduced right Facial Symmetry: Abnormal symmetry right Facial Strength: Reduced right Facial Sensation: Reduced right Lingual ROM: Reduced right Lingual Strength: Reduced   Ice Chips Ice chips: Impaired Presentation: Spoon Oral Phase Impairments: Reduced lingual movement/coordination Oral Phase Functional Implications: Prolonged oral transit Pharyngeal Phase Impairments: Suspected delayed Swallow;Multiple swallows   Thin Liquid Thin Liquid: Impaired Presentation: Cup;Straw Oral Phase Impairments: Reduced lingual movement/coordination Oral Phase Functional Implications: Prolonged oral transit Pharyngeal  Phase Impairments: Suspected delayed Swallow;Multiple swallows    Nectar Thick Nectar Thick Liquid: Not tested   Honey Thick Honey Thick Liquid: Not tested   Puree Puree: Impaired Presentation: Spoon Oral Phase Impairments: Reduced lingual movement/coordination Oral Phase Functional Implications: Oral residue Pharyngeal Phase Impairments: Suspected delayed Swallow;Multiple swallows   Solid     Solid: Impaired Presentation: Spoon Oral Phase Impairments: Reduced lingual movement/coordination;Impaired mastication;Reduced labial seal;Other (comment) Oral Phase Functional Implications: Right anterior spillage;Prolonged oral transit;Oral residue;Right lateral sulci pocketing Pharyngeal Phase Impairments: Suspected delayed Swallow;Multiple swallows      [name] MA, CCC-SLP Acute Rehabilitation Services   07/28/2024,1:13 PM

## 2024-07-28 NOTE — Evaluation (Signed)
 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   Bilateral knee pain 02/23/2022   Recurrent falls 02/23/2022   Bilateral hand numbness 02/23/2022   Urinary incontinence 02/23/2022   UTI (urinary tract infection) 02/23/2022   AKI (acute kidney injury) 02/23/2022   Hypercalcemia 02/23/2022   Hypokalemia 02/23/2022   Alcohol-induced mood disorder (HCC) 12/03/2014   Alcohol abuse    Chest pain 10/18/2013   Abnormal ECG 10/18/2013   Flank pain 10/18/2013   Obesity, Class III, BMI 40-49.9 (morbid obesity) (HCC)    Bipolar 1 disorder (HCC)    Hypertension    Accidental acetaminophen  overdose 04/17/2012    Class: Acute   Past Medical History:  Past Medical History:  Diagnosis Date   Bipolar 1 disorder (HCC)    Hypertension    Obesity    Past Surgical History:  Past Surgical History:  Procedure Laterality Date   ABDOMINAL HYSTERECTOMY  ~2002   ANTERIOR CERVICAL DECOMP/DISCECTOMY FUSION N/A 02/24/2022   Procedure: ANTERIOR CERVICAL THREE- FOUR AND CERVICAL FOUR THROUGH FIVE DECOMPRESSION/DISCECTOMY FUSION 2 LEVELS;  Surgeon: Colon Shove, MD;  Location: [facility] OR;  Service: Neurosurgery;  Laterality: N/A;   HPI:  78 y.o. female with medical history significant of essential hypertension, hyperlipidemia, bipolar disorder, chronic pain syndrome, history of alcohol abuse, who presented to the ER via EMS today with right-sided facial droop and some slurred speech. MRI of head Acute non-hemorrhagic left MCA territory infarction involving the frontal,  parietal, and temporal lobes with associated subcortical edema and mild cerebral swelling. No significant mass effect or midline shift.   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 Pt was evaluated by SLP during 2023 hospital stay documenting some milder cognitive deficits at the time. No family or caregiver present this date to assist with PLOF. Pt is exhibiting suspected increase in cognitive deficits including areas of executive function (per PT/OT correspondence (sequencing with mobility tasks), attention, recall, and problem solving. Pt is able to speak at the word and phrase level at times but exhibits delays with fluency of speech and anomic aphasia deficits as expected. She was inconsistently able to follow single step commands 2/5 trials. Oral motor exam did reveal right sided facial and labial weakness as well as decreased sensation with some mild drooling. Speech intelligibility was mildly slurred positive for dysarthria as well as reduced vocal intensity at times impairing communication accuracy.  Prior notes state involvement from grandson for ADLS. Pt reports living with spouse but will continue to gather information on most recent PLOF to assist with DC planning and goals of care.  Per chart review, adding clinical swallow evalation per neurology recommendation for post CVA dysphagia.    SLP Assessment  SLP Recommendation/Assessment: Patient needs continued Speech Language Pathology Services SLP Visit Diagnosis: Aphasia (R47.01)     Assistance Recommended at Discharge  Frequent or constant Supervision/Assistance  Functional Status Assessment Patient has had a recent decline in their functional status and demonstrates the ability to make significant improvements in function in a reasonable  and predictable amount of time.  Frequency and Duration min  2x/week  2 weeks      SLP Evaluation Cognition  Overall Cognitive Status: Impaired/Different from baseline Arousal/Alertness: Awake/alert Orientation Level: Oriented to person;Disoriented to time;Disoriented to situation;Disoriented to place Month: October Day of Week: Incorrect Attention: Focused;Sustained Focused Attention: Impaired Focused Attention Impairment: Verbal basic;Verbal complex;Functional basic;Functional complex Sustained Attention: Impaired Sustained Attention Impairment: Verbal basic;Verbal complex;Functional basic;Functional complex Memory: Impaired Memory Impairment: Decreased recall of new information Awareness: Impaired Awareness Impairment: Intellectual impairment;Emergent impairment Problem Solving: Impaired Problem Solving Impairment: Functional basic;Verbal basic;Verbal complex;Functional complex Executive Function: Sequencing;Organizing;Self Monitoring Sequencing: Impaired Organizing: Impaired Self Monitoring: Impaired Safety/Judgment: Impaired       Comprehension  Auditory Comprehension Overall Auditory Comprehension: Impaired Yes/No Questions: Impaired Basic Biographical Questions: 51-75% accurate Other Yes/No Questions Comments`: Moderate Commands: Impaired One Step Basic Commands: 50-74% accurate Two Step Basic Commands: 0-24% accurate EffectiveTechniques: Repetition;Increased volume;Pausing;Extra processing time;Slowed speech    Expression Expression Primary Mode of Expression: Verbal Verbal Expression Overall Verbal Expression: Impaired Initiation: Impaired Level of Generative/Spontaneous Verbalization: Word;Phrase Repetition: Impaired Level of Impairment: Sentence level Pragmatics: Impairment Impairments: Eye contact;Monotone Interfering Components: Attention Written Expression Dominant Hand:  (pt stated left but unsure if reliable; was utilzing right)   Oral / Motor  Oral Motor/Sensory Function Overall Oral Motor/Sensory  Function: Mild impairment Facial ROM: Reduced left Facial Symmetry: Abnormal symmetry right Facial Strength: Reduced right Facial Sensation: Reduced right Motor Speech Overall Motor Speech: Impaired Respiration: Impaired Level of Impairment: Sentence Phonation: Low vocal intensity Resonance: Hypernasality Articulation: Impaired Level of Impairment: Phrase Intelligibility: Intelligibility reduced Motor Planning: Within functional limits           Kiaja Shorty H. MA, CCC-SLP Acute Rehabilitation Services   07/28/2024, 9:06 AM

## 2024-07-28 NOTE — Progress Notes (Addendum)
 Number STROKE TEAM PROGRESS NOTE   INTERIM HISTORY/SUBJECTIVE No family at the bedside.  Patient is laying in the bed in no apparent distress still with mild expressive aphasia and dysarthria, she follows commands no blink to threat on the right  appears to have a right hemianopia, right facial droop and bilateral lower extremities with a drift MRI brain with acute left MCA infarct.  MR angiogram of the brain shows left M1 occlusion in the mid to distal aspect moderate stenosis of right PCA P2 segment EEG with continuous slowing in the left frontotemporal parietal region, no seizures identified  CBC    Component Value Date/Time   WBC 6.1 07/27/2024 2321   RBC 3.28 (L) 07/27/2024 2321   HGB 7.9 (L) 07/27/2024 2321   HCT 24.4 (L) 07/27/2024 2321   PLT 187 07/27/2024 2321   MCV 74.4 (L) 07/27/2024 2321   MCH 24.1 (L) 07/27/2024 2321   MCHC 32.4 07/27/2024 2321   RDW 16.2 (H) 07/27/2024 2321   LYMPHSABS 1.4 07/27/2024 1503   MONOABS 0.5 07/27/2024 1503   EOSABS 0.2 07/27/2024 1503   BASOSABS 0.0 07/27/2024 1503    BMET    Component Value Date/Time   NA 143 07/27/2024 1505   K 4.0 07/27/2024 1505   CL 110 07/27/2024 1505   CO2 22 07/27/2024 1503   GLUCOSE 103 (H) 07/27/2024 1505   BUN 13 07/27/2024 1505   CREATININE 1.16 (H) 07/27/2024 2321   CREATININE 1.23 (H) 05/02/2023 1515   CALCIUM 10.7 (H) 07/27/2024 1503   CALCIUM 10.3 02/26/2022 0941   EGFR 46 (L) 05/02/2023 1515   GFRNONAA 49 (L) 07/27/2024 2321    IMAGING past 24 hours MR Brain Wo Contrast (neuro protocol) Result Date: 07/28/2024 EXAM: MRI BRAIN WITHOUT CONTRAST 07/28/2024 04:55:37 AM TECHNIQUE: Multiplanar multisequence MRI of the head/brain was performed without the administration of intravenous contrast. BLADE sequences were utilized to compensate for patient motion. The study is mildly degraded by patient motion. COMPARISON: CT of the head dated 07/27/2024 and MRI of the head dated 02/23/2022. CLINICAL  HISTORY: Neuro deficit, acute, stroke suspected. Patient confused, not able to hold still. BLADE sequences utilized to compensate for motion. FINDINGS: BRAIN AND VENTRICLES: There is restricted diffusion within the cortex of the left frontal, parietal and temporal lobes compatible with acute non-hemorrhagic MCA distribution infarction. There is subcortical edema with mild cerebral swelling but no significant mass effect or midline shift. There is mild-to-moderate subcortical cerebral white matter disease. No intracranial hemorrhage. No mass. No midline shift. No hydrocephalus. The sella is unremarkable. Normal flow voids. ORBITS: No acute abnormality. SINUSES AND MASTOIDS: No acute abnormality. BONES AND SOFT TISSUES: Normal marrow signal. No acute soft tissue abnormality. IMPRESSION: 1. Acute non-hemorrhagic left MCA territory infarction involving the frontal, parietal, and temporal lobes with associated subcortical edema and mild cerebral swelling. No significant mass effect or midline shift. 2. Mild-to-moderate subcortical white matter disease. 3. Mild motion-related image degradation. Electronically signed by: Evalene Coho MD 07/28/2024 05:06 AM EDT RP Workstation: [id number]   CT ANGIO HEAD NECK W WO CM (CODE STROKE) Addendum Date: 07/27/2024 ADDENDUM REPORT: 07/27/2024 16:34 ADDENDUM: Due to motion on the initial acquisition, a repeat scan was performed. Unchanged appearance of the M1 and more distal left middle cerebral artery vessels. 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 Beyond this, there is apparent occlusion of multiple left middle cerebral artery branches (beginning at the proximal M2 segment level). These results  were called by telephone at the time of interpretation on 07/27/2024 at 4:25 pm to provider [facility] , who verbally acknowledged these results. The previously described stenosis of the paraclinoid left ICA appears  moderate-to-severe on the most recent CTA acquisition. The previously described stenosis within the right posterior cerebral artery P2 segment appears severe on the most recent CTA acquisition. Electronically Signed   By: Rockey Childs D.O.   On: 07/27/2024 16:34   Result Date: 07/27/2024 CLINICAL DATA:  Provided history: Neuro deficit, acute, stroke suspected. EXAM: CT ANGIOGRAPHY HEAD AND NECK WITH AND WITHOUT CONTRAST TECHNIQUE: Multidetector CT imaging of the head and neck was performed using the standard protocol during bolus administration of intravenous contrast. Multiplanar CT image reconstructions and MIPs were obtained to evaluate the vascular anatomy. Carotid stenosis measurements (when applicable) are obtained utilizing NASCET criteria, using the distal internal carotid diameter as the denominator. RADIATION DOSE REDUCTION: This exam was performed according to the departmental dose-optimization program which includes automated exposure control, adjustment of the mA and/or kV according to patient size and/or use of iterative reconstruction technique. CONTRAST:  Administered contrast not known at this time. COMPARISON:  Noncontrast head CT performed earlier today 07/27/2024. FINDINGS: CTA NECK FINDINGS Aortic arch: Standard aortic branching. Minimal atherosclerotic plaque within the aortic arch. Streak/beam hardening artifact arising from a dense contrast bolus partially obscures the left subclavian artery. Within this limitation, there is no appreciable hemodynamically significant innominate or proximal subclavian artery stenosis. Right carotid system: CCA and ICA patent within the neck without stenosis or significant atherosclerotic disease. Partially retropharyngeal course of the cervical ICA. Left carotid system: CCA and ICA patent within the neck without stenosis. Minimal atherosclerotic plaque about the carotid bifurcation. Vertebral arteries: Venous reflux of contrast partially obscures the left  vertebral artery proximal V1 segment. Within this limitation, the vertebral arteries are codominant and patent within the neck without stenosis or significant aspect disease. Skeleton: Prior C3-C5 ACDF. Cervical and thoracic spondylosis. No acute fracture or aggressive osseous lesion. Other neck: Multiple thyroid  nodules (the largest an exophytic nodule projecting inferiorly from the lower pole of the right thyroid  lobe measuring 3 cm). This nodule extends into the superior mediastinum. Upper chest: No consolidation within the imaged lung apices. Review of the MIP images confirms the above findings CTA HEAD FINDINGS Moderately motion degraded examination. Within this limitation, findings are as follows. Anterior circulation: The intracranial internal carotid arteries are patent. Atherosclerotic plaque within both vessels. Most notably, there is up to moderate stenosis of the paraclinoid ICAs, bilaterally. Occlusion versus severe near-occlusive stenosis within the left middle cerebral artery mid-to-distal M1 segment (for instance as seen on series 10, image 19). Beyond this, multiple left MCA branches are poorly delineated (beginning at the proximal M2 segment level) and are likely occluded. The right middle cerebral artery M1 segment is patent. No right M2 proximal branch occlusion or high-grade proximal stenosis is identified. The anterior cerebral arteries are patent. No intracranial aneurysm is identified. Posterior circulation: The intracranial vertebral arteries are patent. The basilar artery is patent. The posterior cerebral arteries are patent. Moderate stenosis within the right posterior cerebral artery P2 segment. Posterior communicating arteries are diminutive or absent, bilaterally. Venous sinuses: Within the limitations of contrast timing, no convincing thrombus. Anatomic variants: As described. Review of the MIP images confirms the above findings CTA head impressiopn #2 called by telephone at the time  of interpretation on 07/27/2024 at 3:35 pm to provider ERIC [facility] , who verbally acknowledged these results. IMPRESSION: CTA  neck: 1. The common carotid and internal carotid arteries are patent within the neck without stenosis. Minimal atherosclerotic plaque on the left. 2. Venous reflux of contrast partially obscures the left vertebral artery proximal V1 segment. Within this limitation, the vertebral arteries are patent within the neck without stenosis or significant atherosclerotic disease. 3. Aortic Atherosclerosis (ICD10-I70.0). 4. 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 A nonemergent thyroid  ultrasound is recommended for further evaluation. Reference: J Am Coll Radiol. 2015 Feb;12(2): 143-50. CTA head: 1. Moderately motion degraded examination. Within this limitation, findings are as follows. 2. Occlusion versus severe-near occlusive stenosis of the left middle cerebral artery mid-to-distal M1 segment. Beyond this, multiple left MCA branches are also poorly delineated (beginning at the proximal M2 segment level) and are likely occluded. 3. Background intracranial atherosclerotic disease as described within the body of the report. Up to moderate stenosis of the paraclinoid internal carotid arteries, bilaterally. Moderate stenosis within the right posterior cerebral artery P2 segment. Electronically Signed: By: Rockey Childs D.O. On: 07/27/2024 15:51   CT HEAD CODE STROKE WO CONTRAST Result Date: 07/27/2024 CLINICAL DATA:  Code stroke. Provided history: Neuro deficit, acute, stroke suspected. EXAM: CT HEAD WITHOUT CONTRAST TECHNIQUE: Contiguous axial images were obtained from the base of the skull through the vertex without intravenous contrast. RADIATION DOSE REDUCTION: This exam was performed according to the departmental dose-optimization program which includes automated exposure control, adjustment of the mA and/or kV according to patient size and/or use of iterative reconstruction technique.  COMPARISON:  Brain MRI 02/23/2022.  Head CT 02/23/2022. FINDINGS: Brain: Generalized cerebral atrophy. A cortical/subcortical infarct within the left parietal lobe has progressed in extent as compared to the brain MRI of 02/23/2022. Small age-indeterminate cortical infarct within the mid to posterior left frontal lobe, new from the prior MRI (series 5, image 37) (series 2, image 23). Background mild patchy and ill-defined hypoattenuation within the cerebral white matter, nonspecific but compatible with chronic small vessel image disease. There is no acute intracranial hemorrhage. No extra-axial fluid collection. No evidence of an intracranial mass. No midline shift. Vascular: No hyperdense vessel.  Atherosclerotic calcifications. Skull: No calvarial fracture or aggressive osseous lesion. Sinuses/Orbits: No mass or acute finding within the imaged orbits. Trace consult thickening within the bilateral ethmoid sinuses. ASPECTS [facility] Stroke Program Early CT Score) - Ganglionic level infarction (caudate, lentiform nuclei, internal capsule, insula, M1-M3 cortex): 6 - Supraganglionic infarction (M4-M6 cortex): 1 Total score (0-10 with 10 being normal): 7 Age-indeterminate infarcts within the left frontal and left parietal lobes. These results were communicated to Dr. Lindzen at 3:19 pmon 10/4/2025by text page via the [facility] messaging system. IMPRESSION: 1. Small age-indeterminate cortical infarct within the mid-to-posterior left frontal lobe, new from the prior brain MRI of 02/23/2022. Additionally, a left parietal lobe cortical/subcortical infarct has increased in extent raising the possibility of an acute on chronic infarct at this site. Consider a brain MRI for further evaluation. ASPECTS is 7. 2. Background parenchymal atrophy and chronic small vessel ischemic disease. Electronically Signed   By: Rockey Childs D.O.   On: 07/27/2024 15:20    Vitals:   07/27/24 2118 07/28/24 0015 07/28/24 0031 07/28/24 0330  BP: (!)  167/91  (!) 210/66 (!) 186/88  Pulse: 84 90  90  Resp: 16  17 19   Temp: 98.5 F (36.9 C) 99 F (37.2 C)  100 F (37.8 C)  TempSrc: Oral Oral  Oral  SpO2: 100% 100%  100%  Weight:  PHYSICAL EXAM General:  Alert, mildly obese elderly African-American lady patient in no acute distress Psych:  Mood and affect appropriate for situation CV: Regular rate and rhythm on monitor Respiratory:  Regular, unlabored respirations on room air GI: Abdomen soft and nontender   NEURO:  Mental Status: She is awake alert and oriented to self age she incorrectly stated a 89, she can follow commands Speech/Language: She has mild expressive aphasia, and mild dysarthria  Cranial Nerves:  II: PERRL. Visual fields no blink to threat on right, appears to have a right hemianopia III, IV, VI: EOMI. Eyelids elevate symmetrically.  V: Sensation is intact to light touch and symmetrical to face.  VII: Right facial droop VIII: hearing intact to voice. IX, X: Palate elevates symmetrically. Phonation is normal.  KP:Dynloizm shrug 5/5. XII: tongue is midline without fasciculations. Motor: 5/5 strength in bilateral uppers, bilateral lowers with drift Tone: is normal and bulk is normal Sensation- Intact to light touch bilaterally. Extinction absent to light touch to DSS.   Coordination: FTN intact bilaterally, HKS: no ataxia in BLE.No drift.  Gait- deferred  NIHSS 4  ASSESSMENT/PLAN  Christie Meza is a 78 y.o. female with history of bipolar 1 diorder, HTN and obesity, who presents to the ED from home via EMS as a Code Stroke, after family noted that she suddenly could not talk. When EMS arrived the patient continued to have difficulty communicating coherently, with facial droop also noted, but no limb weakness and was able to walk to the stretcher. BP was 180/96 per EMS. 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  Abnormal MRI appearance of diffuse DWI cortical hyperintensity raises question of secondary prolonged seizures as well  code Stroke CT head Small age-indeterminate cortical infarct within the mid-to-posterior left frontal lobe. left parietal lobe cortical/subcortical infarct has increased in extent raising the possibility of an acute on chronic infarct at this site. Aspects 7.atrophy and chronic small vessel ischemic   CTA head & neck Occlusion or severe near-occlusive stenosis of the left middle cerebral artery mid and distal M1 segment. 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  moderate stenosis within the right posterior cerebral artery P2 segment. 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 2D Echo ordered EEG- Continuous slow, left fronto-temporo-parietal region  IMPRESSION: This study is suggestive of cortical dysfunction arising from left fronto-temporo-parietal region likely secondary to underlying structural abnormality. No seizures or definite epileptiform discharges were seen throughout the recording. If suspicion for interictal activity remains a concern, a prolonged study including sleep can be considered.   LDL 73 HgbA1c 5.3 VTE prophylaxis - Lovenox   No antithrombotic prior to admission, now on aspirin  81 mg daily and clopidogrel 75 mg daily for 3 months and then ASA alone. Therapy recommendations:  Pending Disposition: Pending  Hypertension Home meds: BiDil  20-37.5  mg, Stable Blood Pressure Goal: SBP less than 160   Hyperlipidemia Home meds: Pravastatin  40 mg,  resumed in  hospital LDL 73, goal < 70 Continue statin at discharge  Dysphagia Patient has post-stroke dysphagia, SLP consulted    Diet   Diet NPO time specified   Advance diet as tolerated  Other Stroke Risk Factors Obesity, Body mass index is 38.89 kg/m., BMI >/= 30 associated with increased stroke risk, recommend weight loss, diet and exercise as appropriate    Other Active Problems Bipolar History of alcohol abuse  Hospital day # 1   Karna Geralds DNP, ACNPC-AG  [facility]  I have personally obtained history,examined this patient, reviewed notes, independently viewed imaging studies, participated in medical decision making and plan of care.ROS completed by me personally and pertinent positives fully documented  I have made any additions or clarifications directly to the above note. Agree with note above.  Patient presented with expressive aphasia and facial droop secondary to left M1 occlusion with abnormal MRI appearance showing diffuse cortical diffusion hyperintensity raising concern for seizures as well however EEG shows diffuse focal slowing but no seizure activity.  Recommend aspirin  and Plavix for 3 months followed by aspirin  alone and continue ongoing stroke workup and aggressive risk factor modification.  Recommend 30-day heart monitor at discharge to look for paroxysmal A-fib as well.  Mobilize out of bed.  Therapy consults.   I personally spent a total of 50 minutes in the care of the patient today including getting/reviewing separately obtained history, performing a medically appropriate exam/evaluation, counseling and educating, placing orders, referring and communicating with other health care professionals, documenting clinical information in the EHR, independently interpreting results, and coordinating care.         Eather Popp, MD Medical  Director [facility] Pager: [phone] 07/28/2024 2:56 PM   To contact Stroke Continuity provider, please refer to [email or web address]. After hours, contact General Neurology

## 2024-07-29 ENCOUNTER — Inpatient Hospital Stay (HOSPITAL_COMMUNITY)

## 2024-07-29 DIAGNOSIS — I69391 Dysphagia following cerebral infarction: Secondary | ICD-10-CM | POA: Diagnosis not present

## 2024-07-29 DIAGNOSIS — I639 Cerebral infarction, unspecified: Secondary | ICD-10-CM | POA: Diagnosis not present

## 2024-07-29 DIAGNOSIS — I6389 Other cerebral infarction: Secondary | ICD-10-CM

## 2024-07-29 DIAGNOSIS — R4701 Aphasia: Secondary | ICD-10-CM | POA: Diagnosis not present

## 2024-07-29 DIAGNOSIS — I63512 Cerebral infarction due to unspecified occlusion or stenosis of left middle cerebral artery: Secondary | ICD-10-CM | POA: Diagnosis not present

## 2024-07-29 DIAGNOSIS — R29704 NIHSS score 4: Secondary | ICD-10-CM | POA: Diagnosis not present

## 2024-07-29 DIAGNOSIS — G936 Cerebral edema: Secondary | ICD-10-CM | POA: Diagnosis not present

## 2024-07-29 LAB — BASIC METABOLIC PANEL WITH GFR
Anion gap: 11 (ref 5–15)
Anion gap: 9 (ref 5–15)
BUN: 10 mg/dL (ref 8–23)
BUN: 9 mg/dL (ref 8–23)
CO2: 21 mmol/L — ABNORMAL LOW (ref 22–32)
CO2: 22 mmol/L (ref 22–32)
Calcium: 10.2 mg/dL (ref 8.9–10.3)
Calcium: 10.4 mg/dL — ABNORMAL HIGH (ref 8.9–10.3)
Chloride: 109 mmol/L (ref 98–111)
Chloride: 111 mmol/L (ref 98–111)
Creatinine, Ser: 1.09 mg/dL — ABNORMAL HIGH (ref 0.44–1.00)
Creatinine, Ser: 1.13 mg/dL — ABNORMAL HIGH (ref 0.44–1.00)
GFR, Estimated: 50 mL/min — ABNORMAL LOW (ref >60)
GFR, Estimated: 53 mL/min — ABNORMAL LOW (ref >60)
Glucose, Bld: 95 mg/dL (ref 70–99)
Glucose, Bld: 96 mg/dL (ref 70–99)
Potassium: 3.3 mmol/L — ABNORMAL LOW (ref 3.5–5.1)
Potassium: 3.4 mmol/L — ABNORMAL LOW (ref 3.5–5.1)
Sodium: 141 mmol/L (ref 135–145)
Sodium: 142 mmol/L (ref 135–145)

## 2024-07-29 LAB — MAGNESIUM: Magnesium: 1.6 mg/dL — ABNORMAL LOW (ref 1.7–2.4)

## 2024-07-29 LAB — CBC
HCT: 22.8 % — ABNORMAL LOW (ref 36.0–46.0)
Hemoglobin: 7.3 g/dL — ABNORMAL LOW (ref 12.0–15.0)
MCH: 23.6 pg — ABNORMAL LOW (ref 26.0–34.0)
MCHC: 32 g/dL (ref 30.0–36.0)
MCV: 73.8 fL — ABNORMAL LOW (ref 80.0–100.0)
Platelets: 163 K/uL (ref 150–400)
RBC: 3.09 MIL/uL — ABNORMAL LOW (ref 3.87–5.11)
RDW: 16 % — ABNORMAL HIGH (ref 11.5–15.5)
WBC: 5.4 K/uL (ref 4.0–10.5)
nRBC: 0 % (ref 0.0–0.2)

## 2024-07-29 LAB — ECHOCARDIOGRAM COMPLETE
Area-P 1/2: 7.59 cm2
Est EF: >75
S' Lateral: 1.6 cm
Weight: 3739 [oz_av]

## 2024-07-29 MED ORDER — POTASSIUM CHLORIDE 10 MEQ/100ML IV SOLN
10.0000 meq | INTRAVENOUS | Status: AC
  Administered 2024-07-29 (×2): 10 meq via INTRAVENOUS
  Filled 2024-07-29: qty 100

## 2024-07-29 MED ORDER — HYDRALAZINE HCL 50 MG PO TABS: 50.0000 mg | ORAL_TABLET | Freq: Two times a day (BID) | ORAL | Status: DC

## 2024-07-29 MED ORDER — PERFLUTREN LIPID MICROSPHERE
1.0000 mL | INTRAVENOUS | Status: AC | PRN
  Administered 2024-07-29: 2 mL via INTRAVENOUS

## 2024-07-29 MED ORDER — MAGNESIUM SULFATE 2 GM/50ML IV SOLN
2.0000 g | Freq: Once | INTRAVENOUS | Status: AC
  Administered 2024-07-29: 2 g via INTRAVENOUS
  Filled 2024-07-29: qty 50

## 2024-07-29 MED ORDER — POTASSIUM CHLORIDE CRYS ER 20 MEQ PO TBCR
40.0000 meq | EXTENDED_RELEASE_TABLET | Freq: Once | ORAL | Status: AC
  Administered 2024-07-29: 40 meq via ORAL
  Filled 2024-07-29: qty 2

## 2024-07-29 MED ORDER — STROKE: EARLY STAGES OF RECOVERY BOOK
Status: AC
  Filled 2024-07-29: qty 1

## 2024-07-29 NOTE — Progress Notes (Signed)
 Inpatient Rehab Coordinator Note:  I met with patient at bedside to discuss CIR recommendations and goals/expectations of CIR stay.  We reviewed 3 hrs/day of therapy, physician follow up, and average length of stay 2 weeks (dependent upon progress) with goals of supervision to min assist.  Pt states she feels like she doesn't need any help to get up and move around, but difficult to tell whether that's lack of awareness vs true improvement.  I spoke to her daughter on the phone to review the above.  Daughter okay with rehab if needed but can only provide supervision in combination with pt's friend [name] (pt lives with him) and needs pt to be ambulatory.  I will need insurance auth but want to ensure we have a reasonable caregiver plan if we pursue CIR.    Reche Lowers, PT, DPT Admissions Coordinator [phone] 07/29/24  1:07 PM

## 2024-07-29 NOTE — Progress Notes (Signed)
 Number STROKE TEAM PROGRESS NOTE   INTERIM HISTORY/SUBJECTIVE No family at the bedside.  Christie Meza is laying in the bed in no apparent distress  .  Christie Meza appears to be doing better and is interacting better.  Speech is improving.  Christie Meza is able to move all 4 extremities well  CBC    Component Value Date/Time   WBC 5.4 07/29/2024 0519   RBC 3.09 (L) 07/29/2024 0519   HGB 7.3 (L) 07/29/2024 0519   HCT 22.8 (L) 07/29/2024 0519   PLT 163 07/29/2024 0519   MCV 73.8 (L) 07/29/2024 0519   MCH 23.6 (L) 07/29/2024 0519   MCHC 32.0 07/29/2024 0519   RDW 16.0 (H) 07/29/2024 0519   LYMPHSABS 1.4 07/27/2024 1503   MONOABS 0.5 07/27/2024 1503   EOSABS 0.2 07/27/2024 1503   BASOSABS 0.0 07/27/2024 1503    BMET    Component Value Date/Time   NA 141 07/29/2024 0519   K 3.4 (L) 07/29/2024 0519   CL 109 07/29/2024 0519   CO2 21 (L) 07/29/2024 0519   GLUCOSE 95 07/29/2024 0519   BUN 10 07/29/2024 0519   CREATININE 1.09 (H) 07/29/2024 0519   CREATININE 1.23 (H) 05/02/2023 1515   CALCIUM 10.2 07/29/2024 0519   CALCIUM 10.3 02/26/2022 0941   EGFR 46 (L) 05/02/2023 1515   GFRNONAA 53 (L) 07/29/2024 0519    IMAGING past 24 hours No results found.   Vitals:   07/28/24 1800 07/28/24 2233 07/29/24 0807 07/29/24 1108  BP: (!) 158/85 (!) 107/46 (!) 164/80 (!) 156/71  Pulse: 78  87 90  Resp:   17 16  Temp:   98.6 F (37 C) 98.6 F (37 C)  TempSrc:   Oral Oral  SpO2:   100% 100%  Weight:         PHYSICAL EXAM General:  Alert, mildly obese elderly African-American lady Christie Meza in no acute distress Psych:  Mood and affect appropriate for situation CV: Regular rate and rhythm on monitor Respiratory:  Regular, unlabored respirations on room air GI: Abdomen soft and nontender   NEURO:  Mental Status: Christie Meza is awake alert and oriented to self age Christie Meza incorrectly stated a 57, Christie Meza can follow commands Speech/Language: Christie Meza has mild expressive aphasia, and mild dysarthria  Cranial Nerves:   II: PERRL. Visual fields no blink to threat on right, appears to have a right hemianopia III, IV, VI: EOMI. Eyelids elevate symmetrically.  V: Sensation is intact to light touch and symmetrical to face.  VII: Right facial droop VIII: hearing intact to voice. IX, X: Palate elevates symmetrically. Phonation is normal.  KP:Dynloizm shrug 5/5. XII: tongue is midline without fasciculations. Motor: 5/5 strength in bilateral uppers, bilateral lowers with drift Tone: is normal and bulk is normal Sensation- Intact to light touch bilaterally. Extinction absent to light touch to DSS.   Coordination: FTN intact bilaterally, HKS: no ataxia in BLE.No drift.  Gait- deferred  NIHSS 4  ASSESSMENT/PLAN  Christie Meza is a 78 y.o. female with history of bipolar 1 diorder, HTN and obesity, who presents to the ED from home via EMS as a Code Stroke, after family noted that Christie Meza suddenly could not talk. When EMS arrived the Christie Meza continued to have difficulty communicating coherently, with facial droop also noted, but no limb weakness and was able to walk to the stretcher. BP was 180/96 per EMS. 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  Abnormal MRI appearance of diffuse DWI cortical hyperintensity raises question of secondary prolonged seizures as well  code Stroke CT head Small age-indeterminate cortical infarct within the mid-to-posterior left frontal lobe. left parietal lobe cortical/subcortical infarct has increased in extent raising the possibility of an acute on chronic infarct at this site. Aspects 7.atrophy and chronic small vessel ischemic   CTA head & neck Occlusion or severe near-occlusive stenosis of the left middle cerebral artery mid and distal M1 segment. 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  moderate stenosis within the right posterior cerebral artery P2 segment. 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 2D Echo done results pending  Hello is yes yes which Christie Meza okay mammogram with this lady EEG- Continuous slow, left fronto-temporo-parietal region  IMPRESSION: This study is suggestive of cortical dysfunction arising from left fronto-temporo-parietal region likely secondary to underlying structural abnormality. No seizures or definite epileptiform discharges were seen throughout the recording. If suspicion for interictal activity remains a concern, a prolonged study including sleep can be considered.   LDL 73 HgbA1c 5.3 VTE prophylaxis - Lovenox   No antithrombotic prior to admission, now on aspirin  81 mg daily and clopidogrel 75 mg daily for 3 months and then ASA alone. 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 Yes with Assist; Fluid consistency: Thin   Advance diet as tolerated  Other Stroke Risk Factors Obesity, Body mass index is 38.89 kg/m., BMI >/= 30 associated with increased stroke risk, recommend weight loss, diet and exercise as appropriate    Other Active Problems Bipolar History of alcohol abuse  Hospital day # 2    Christie Meza presented  with expressive aphasia and facial droop secondary to left M1 occlusion with abnormal MRI appearance showing diffuse cortical diffusion hyperintensity raising concern for seizures as well however EEG shows diffuse focal slowing but no seizure activity.  Recommend aspirin  and Plavix for 3 months followed by aspirin  alone and aggressive risk factor modification.  Recommend 30-day heart monitor at discharge to look for paroxysmal A-fib as well at discharge.  Mobilize out of bed.  Therapy consults.  Stroke team will sign off.  Follow-up as an outpatient in the stroke clinic with nurse practitioner in 2 months or call earlier if necessary.   I personally spent a total of 35 minutes in the care of the Christie Meza today including getting/reviewing separately obtained history, performing a medically appropriate exam/evaluation, counseling and educating, placing orders, referring and communicating with other health care professionals, documenting clinical information in the EHR, independently interpreting results, and coordinating care.         Eather Popp, MD Medical Director [facility] Pager: [phone] 07/29/2024 1:37 PM   To contact  Stroke Continuity provider, please refer to [email or web address]. After hours, contact General Neurology

## 2024-07-29 NOTE — Progress Notes (Signed)
 Occupational Therapy Treatment Patient Details Name: Christie Meza MRN: [id number] DOB: 24-Dec-1945 Today's Date: 07/29/2024   History of present illness 78 y.o. female presents to [facility] 07/27/24 with R sided facial droop and slurred speech. 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 PMHx: HTN, bipolar disorder, obesity, chronic pain syndrome, alcohol abuse, urine incontinence   OT comments  Pt progressing toward goals, more alert this session compared to evaluation. Pt needing min-mod A for ADLs, min A for bed mobility and mod A for transfers with RW. Pt with R inattention and possible R visual field deficit, needs mod cues and physical assist to grasp RW with RUE and turning to R to return to bed. Pt with decr strength and coordination in RUE, impaired cognition, needs mod cues to sustain attention to basic 1 step commands. Pt presenting with impairments listed below, will follow acutely. Patient will benefit from intensive inpatient follow-up therapy, >3 hours/day.  Confirmed with pt's daughter in room, pt using cane at baseline for mobility, lives with spouse and was ind with ADLs and IADLs. Daughter provides transportation for pt and pt's spouse.       If plan is discharge home, recommend the following:  Two people to help with walking and/or transfers;A lot of help with bathing/dressing/bathroom;Assistance with cooking/housework;Direct supervision/assist for financial management;Direct supervision/assist for medications management;Assist for transportation;Help with stairs or ramp for entrance   Equipment Recommendations  Other (comment) (defer)    Recommendations for Other Services PT consult;Rehab consult    Precautions / Restrictions Precautions Precautions: Fall Recall of Precautions/Restrictions: Impaired Restrictions Weight Bearing  Restrictions Per Provider Order: No       Mobility Bed Mobility Overal bed mobility: Needs Assistance Bed Mobility: Sit to Supine       Sit to supine: Min assist        Transfers Overall transfer level: Needs assistance Equipment used: Rolling walker (2 wheels) Transfers: Sit to/from Stand             General transfer comment: physical assist to redirect and place R hand on RW to ambulate     Balance Overall balance assessment: Needs assistance Sitting-balance support: No upper extremity supported, Feet supported Sitting balance-Leahy Scale: Fair     Standing balance support: Bilateral upper extremity supported, During functional activity, Reliant on assistive device for balance Standing balance-Leahy Scale: Poor                             ADL either performed or assessed with clinical judgement   ADL Overall ADL's : Needs assistance/impaired Eating/Feeding: Sitting;Minimal assistance   Grooming: Minimal assistance;Sitting                   Toilet Transfer: Moderate assistance;Ambulation;Rolling walker (2 wheels) Toilet Transfer Details (indicate cue type and reason): simulated around bed         Functional mobility during ADLs: Moderate assistance;Rolling walker (2 wheels)      Extremity/Trunk Assessment Upper Extremity Assessment Upper Extremity Assessment: Defer to OT evaluation;RUE deficits/detail RUE Deficits / Details: 3/5 to ROM shoulder against gravity, difficulty bringing digits 1 & 2 into fist, decr coordination RUE Coordination: decreased fine motor;decreased gross motor   Lower Extremity Assessment Lower Extremity Assessment: Defer to PT evaluation        Vision   Additional Comments: difficulty following commands for visual assessment,  R inattention noted, but tracks L and R during session without difficulty, does use additional head turns; questionable R visual field deficit   Perception Perception Perception:  Not tested   Praxis Praxis Praxis: Not tested   Communication Communication Communication: Impaired Factors Affecting Communication: Difficulty expressing self;Reduced clarity of speech   Cognition Arousal: Alert Behavior During Therapy: Restless Cognition: Cognition impaired   Orientation impairments: Situation Awareness: Online awareness impaired Memory impairment (select all impairments): Short-term memory Attention impairment (select first level of impairment): Focused attention Executive functioning impairment (select all impairments): Sequencing, Reasoning OT - Cognition Comments: needs mod cues to sustain attention to simple 1 step command                 Following commands: Impaired Following commands impaired: Follows one step commands inconsistently      Cueing   Cueing Techniques: Verbal cues, Tactile cues  Exercises      Shoulder Instructions       General Comments VSS on RA    Pertinent Vitals/ Pain       Pain Assessment Pain Assessment: No/denies pain  Home Living                                          Prior Functioning/Environment              Frequency  Min 2X/week        Progress Toward Goals  OT Goals(current goals can now be found in the care plan section)  Progress towards OT goals: Progressing toward goals  Acute Rehab OT Goals Patient Stated Goal: to rest OT Goal Formulation: With patient Time For Goal Achievement: 08/11/24 Potential to Achieve Goals: Good ADL Goals Pt Will Perform Grooming: sitting;with contact guard assist Pt Will Perform Upper Body Dressing: with min assist;sitting Pt Will Perform Lower Body Dressing: with min assist;sitting/lateral leans;sit to/from stand Pt Will Transfer to Toilet: with min assist;stand pivot transfer;bedside commode Pt Will Perform Tub/Shower Transfer: Tub transfer;Shower transfer;shower seat;Stand pivot transfer;Squat pivot transfer;with min assist  Plan       Co-evaluation                 AM-PAC OT 6 Clicks Daily Activity     Outcome Measure   Help from another person eating meals?: A Little Help from another person taking care of personal grooming?: A Little Help from another person toileting, which includes using toliet, bedpan, or urinal?: A Lot Help from another person bathing (including washing, rinsing, drying)?: A Lot Help from another person to put on and taking off regular upper body clothing?: A Lot Help from another person to put on and taking off regular lower body clothing?: A Lot 6 Click Score: 14    End of Session Equipment Utilized During Treatment: Gait belt;Rolling walker (2 wheels)  OT Visit Diagnosis: Other abnormalities of gait and mobility (R26.89);Unsteadiness on feet (R26.81);Muscle weakness (generalized) (M62.81)   Activity Tolerance Patient tolerated treatment well   Patient Left in bed;with call bell/phone within reach;with bed alarm set;with family/visitor present   Nurse Communication Mobility status        Time: 8463-8443 OT Time Calculation (min): 20 min  Charges: OT General Charges $OT Visit: 1 Visit OT Treatments $Therapeutic Activity: 8-22 mins  Michel Hendon K, OTD, OTR/L SecureChat Preferred Acute Rehab [phone]   Laneta POUR Koonce 07/29/2024, 4:22 PM

## 2024-07-29 NOTE — Progress Notes (Signed)
 Physical Therapy Treatment Patient Details Name: Christie Meza MRN: [id number] DOB: 02-20-1946 Today's Date: 07/29/2024   History of Present Illness 78 y.o. female presents to [facility] 07/27/24 with R sided facial droop and slurred speech. 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 PMHx: HTN, bipolar disorder, obesity, chronic pain syndrome, alcohol abuse, urine incontinence    PT Comments  Pt with improvement over yesterday but continues to have significant deficits in balance, mobility, and cognition. Pt with decr awareness of her deficits and decr attention to rt side. Amb short distance but required constant cuing/assist. Patient will benefit from intensive inpatient follow-up therapy, >3 hours/day.      If plan is discharge home, recommend the following: A lot of help with walking and/or transfers;A lot of help with bathing/dressing/bathroom;Assistance with cooking/housework;Direct supervision/assist for medications management;Direct supervision/assist for financial management;Assist for transportation;Help with stairs or ramp for entrance   Can travel by private vehicle        Equipment Recommendations  Other (comment) (TBA)    Recommendations for Other Services Rehab consult     Precautions / Restrictions Precautions Precautions: Fall;Other (comment) Recall of Precautions/Restrictions: Impaired Precaution/Restrictions Comments: rt inattention Restrictions Weight Bearing Restrictions Per Provider Order: No     Mobility  Bed Mobility Overal bed mobility: Needs Assistance Bed Mobility: Supine to Sit     Supine to sit: Min assist     General bed mobility comments: Assist for safety and to make sure RUE isn't left behind.    Transfers Overall transfer level: Needs assistance Equipment used: None, Rolling walker (2 wheels) Transfers:  Sit to/from Stand Sit to Stand: Min assist           General transfer comment: Assist for balance and safety. Verbal cues for hand placement    Ambulation/Gait Ambulation/Gait assistance: Mod assist Gait Distance (Feet): 50 Feet Assistive device: Rolling walker (2 wheels) Gait Pattern/deviations: Step-through pattern, Decreased step length - right, Drifts right/left Gait velocity: decr Gait velocity interpretation: <1.8 ft/sec, indicate of risk for recurrent falls   General Gait Details: Assist for balance, to manage walker, and for safety. Pt with frequent loss of contact with correct placement of rt hand on the walker causing the walker to turn to the rt and pt unaware/unable to correct.   Stairs             Wheelchair Mobility     Tilt Bed    Modified Rankin (Stroke Patients Only) Modified Rankin (Stroke Patients Only) Pre-Morbid Rankin Score: Moderate disability Modified Rankin: Severe disability     Balance Overall balance assessment: Needs assistance Sitting-balance support: No upper extremity supported, Feet supported Sitting balance-Leahy Scale: Fair     Standing balance support: Bilateral upper extremity supported, During functional activity, Reliant on assistive device for balance, Single extremity supported Standing balance-Leahy Scale: Poor Standing balance comment: UE support                            Communication Communication Communication: Impaired Factors Affecting Communication: Reduced clarity of speech  Cognition Arousal: Alert Behavior During Therapy: Impulsive   PT - Cognitive impairments: Awareness, Attention, Problem solving, Safety/Judgement, No family/caregiver present to determine baseline                       PT - Cognition Comments: Decr safety awareness. Kept moving despite  being asked to wait. Rt inattention with rt hand constantly moving locations on walker Following commands: Impaired Following  commands impaired: Follows one step commands inconsistently, Follows one step commands with increased time    Cueing Cueing Techniques: Verbal cues, Tactile cues  Exercises      General Comments General comments (skin integrity, edema, etc.): VSS on RA      Pertinent Vitals/Pain Pain Assessment Pain Assessment: 0-10 Pain Score: 4  Pain Location: headache Pain Descriptors / Indicators: Headache Pain Intervention(s): RN gave pain meds during session, Limited activity within patient's tolerance    Home Living                          Prior Function            PT Goals (current goals can now be found in the care plan section) Acute Rehab PT Goals Patient Stated Goal: to go home PT Goal Formulation: With patient Time For Goal Achievement: 08/12/24 Potential to Achieve Goals: Good Progress towards PT goals: Goals met and updated - see care plan    Frequency    Min 2X/week      PT Plan      Co-evaluation              AM-PAC PT 6 Clicks Mobility   Outcome Measure  Help needed turning from your back to your side while in a flat bed without using bedrails?: A Little Help needed moving from lying on your back to sitting on the side of a flat bed without using bedrails?: A Little Help needed moving to and from a bed to a chair (including a wheelchair)?: A Lot Help needed standing up from a chair using your arms (e.g., wheelchair or bedside chair)?: A Little Help needed to walk in hospital room?: A Lot Help needed climbing 3-5 steps with a railing? : Total 6 Click Score: 14    End of Session Equipment Utilized During Treatment: Gait belt Activity Tolerance: Patient tolerated treatment well Patient left: with call bell/phone within reach;in chair;with chair alarm set Nurse Communication: Mobility status PT Visit Diagnosis: Unsteadiness on feet (R26.81);Other abnormalities of gait and mobility (R26.89);Muscle weakness (generalized) (M62.81)      Time: 8499-8481 PT Time Calculation (min) (ACUTE ONLY): 18 min  Charges:    $Gait Training: 8-22 mins PT General Charges $$ ACUTE PT VISIT: 1 Visit                     [facility] PT Acute Rehabilitation Services Office [phone]    Rodgers ORN [facility] 07/29/2024, 4:31 PM

## 2024-07-29 NOTE — Progress Notes (Signed)
 Pt received 2 infusions of potassium chloride  and 1 infusion of magnesium  per order.  VSS during and after infusions.  Lab ordered and pending results.

## 2024-07-29 NOTE — Progress Notes (Signed)
 In room with Pt. Pt alert and oriented x 4. Tele showed a 6 beat run tachycardia (Rate 200 - 230's bpm), not sustained.  Pt asymptomatic; denies chest pain, SOB, or dizziness.  BP 107/46. Other VSS. Rhythm strips saved.  Press photographer and physician notified.  Labs reviewed K+ 4.0, Ca 10.7 ; no current labs for mag or phosphorous results available.  Provider order mag level STAT

## 2024-07-29 NOTE — Progress Notes (Signed)
 Speech Language Pathology Treatment: Dysphagia;Cognitive-Linguistic  Patient Details Name: Christie Meza MRN: [id number] DOB: 1946/07/15 Today's Date: 07/29/2024 Time: 1444-1500 SLP Time Calculation (min) (ACUTE ONLY): 16 min  Assessment / Plan / Recommendation Clinical Impression  Communication: Pt presents with improved expressive language today with improved word-retrieval and ability to carry on a conversation that is appropriate. Speech is moderately dysarthric. Reviewed some strategies to improve clarity - slowing down, pausing between words, over-exaggerating sounds. Needed frequent verbal cues to follow-through. Demonstrated awareness of potential communication breakdown, occasionally asking me if I understood her message.    Swallowing: Continues with occasional right sided loss of liquid boluses; improved ability to draw liquid from a straw; improved labial seal. Does not like the pureed food. Encouraged pt that we will be working toward advancing her diet. She verbalized understanding. She is protecting airway well during PO intake.   SLP will follow.     HPI HPI: 78 y.o. female with medical history significant of essential hypertension, hyperlipidemia, bipolar disorder, chronic pain syndrome, history of alcohol abuse, who presented to the ER via EMS today with right-sided facial droop and some slurred speech. 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 No significant mass effect or midline shift.      SLP Plan  Continue with current plan of care          Recommendations  Diet recommendations: Dysphagia 1 (puree);Thin liquid Liquids provided via: Cup;Straw Medication Administration: Whole meds with puree Supervision: Staff to assist with self feeding Compensations: Minimize environmental distractions;Small sips/bites;Lingual sweep for  clearance of pocketing Postural Changes and/or Swallow Maneuvers: Seated upright 90 degrees                  Oral care BID   Frequent or constant Supervision/Assistance Dysphagia, oral phase (R13.11);Dysarthria and anarthria (R47.1)     Continue with current plan of care    [name] L. Vona, MA CCC/SLP Clinical Specialist - Acute Care SLP Acute Rehabilitation Services Office number [phone]  [name]  07/29/2024, 3:05 PM

## 2024-07-29 NOTE — TOC CM/SW Note (Signed)
 Transition of Care [facility] [place]) - Inpatient Brief Assessment   Patient Details  Name: Christie Meza MRN: [id number] Date of Birth: September 25, 1946  Transition of Care [facility]) CM/SW Contact:    [name], RN Phone Number: 07/29/2024, 1:27 PM   Clinical Narrative:  CIR to start insurance auth for a rehab stay.  IP Care management following.  Transition of Care Asessment: Insurance and Status: Insurance coverage has been reviewed Patient has primary care physician: Yes Home environment has been reviewed: home with friend   Prior/Current Home Services: No current home services Social Drivers of Health Review: SDOH reviewed no interventions necessary Readmission risk has been reviewed: Yes Transition of care needs: transition of care needs identified, TOC will continue to follow

## 2024-07-29 NOTE — Plan of Care (Signed)
  Problem: Pain Managment: Goal: General experience of comfort will improve and/or be controlled Outcome: Progressing   Problem: Safety: Goal: Ability to remain free from injury will improve Outcome: Progressing   Problem: Clinical Measurements: Goal: Ability to maintain clinical measurements within normal limits will improve Outcome: Progressing Goal: Will remain free from infection Outcome: Progressing Goal: Diagnostic test results will improve Outcome: Progressing Goal: Respiratory complications will improve Outcome: Progressing Goal: Cardiovascular complication will be avoided Outcome: Progressing

## 2024-07-29 NOTE — Progress Notes (Signed)
 PROGRESS NOTE    ELBERT POLYAKOV  FMW:[id number] DOB: 1946/02/12 DOA: 07/27/2024 PCP: Shelda Atlas, MD     Brief Narrative:  BETTEJANE LEAVENS is a 78 y.o. female with medical history significant of essential hypertension, hyperlipidemia, bipolar disorder, chronic pain syndrome, history of alcohol abuse, who presented to the ER via EMS with right-sided facial droop and some slurred speech.  Patient apparently was last known normal about 7:00 last night according to daughter.  About 1:30 PM this afternoon she called the daughter telling her that she is on the road to speak well.  Not sure of the timing.  When EMS arrive she did have a right facial droop and bilateral leg weakness.  There was global aphasia and dysarthria.  Patient was deemed not a candidate for tPA administration due to unknown duration and last known normal.  She has been seen by neurology and patient had initial head CT that suggested a small CVA In the mid to posterior left frontal lobe.   New events last 24 hours / Subjective: Patient is oriented to self only.  Does follow commands.  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 No seizures or definite epileptiform discharges were seen throughout the recording.  - Echocardiogram pending - Plavix, Lipitor - PT OT SLP - Stroke team following - Dysphagia 1 diet - CIR pending  Hyperlipidemia - Lipitor   Hypertension - Isosorbide -hydralazine   Bipolar disorder -  Seroquel , Cymbalta   Obesity Estimated body mass index is 38.89 kg/m as calculated from the following:   Height as of 02/24/22: 5' 5 (1.651 m).   Weight as of this encounter: 106 kg.  Hypokalemia - Replace  Hypomagnesia - Replace    DVT prophylaxis:  enoxaparin  (LOVENOX ) injection 40 mg Start: 07/28/24 1000  Code Status: Full code Family Communication: None at bedside Disposition Plan: CIR Status is: Inpatient Remains inpatient appropriate because: PT has recommended CIR consideration    Antimicrobials:  Anti-infectives (From admission, onward)    None        Objective: Vitals:   07/28/24 1528 07/28/24 1800 07/28/24 2233 07/29/24 0807  BP: (!) 194/90 (!) 158/85 (!) 107/46 (!) 164/80  Pulse: 84 78  87  Resp: 14   17  Temp: 98.5 F (36.9 C)   98.6 F (37 C)  TempSrc: Oral   Oral  SpO2: 100%   100%  Weight:        Intake/Output Summary (Last 24 hours) at 07/29/2024 1034 Last data filed at 07/29/2024 0815 Gross per 24 hour  Intake 249.97 ml  Output 2050 ml  Net -1800.03 ml   Filed Weights   07/27/24 1500  Weight: 106 kg    Examination:  General exam: Appears calm and comfortable  Respiratory system: Clear to auscultation. Respiratory effort normal. Cardiovascular system: S1 & S2 heard, RRR. Gastrointestinal system: Abdomen is nondistended, soft  Central nervous system: Alert and oriented to self only. +dysarthric speech, moves all extremities to command Extremities: Symmetric in appearance  Data Reviewed: I have personally reviewed following labs and imaging studies  CBC: Recent Labs  Lab 07/27/24 1503 07/27/24 1505 07/27/24 2321 07/29/24 0519  WBC 5.3  --  6.1 5.4  NEUTROABS 3.2  --   --   --   HGB 8.1* 8.5* 7.9* 7.3*  HCT 25.5* 25.0* 24.4* 22.8*  MCV 75.0*  --  74.4* 73.8*  PLT 194  --  187 163   Basic Metabolic Panel: Recent Labs  Lab 07/27/24 1503 07/27/24 1505 07/27/24 2321 07/28/24 2345 07/29/24 0519  NA 140 143  --  142 141   K 3.9 4.0  --  3.3* 3.4*  CL 111 110  --  111 109  CO2 22  --   --  22 21*  GLUCOSE 104* 103*  --  96 95  BUN 13 13  --  9 10  CREATININE 1.54* 1.60* 1.16* 1.13* 1.09*  CALCIUM 10.7*  --   --  10.4* 10.2  MG  --   --   --  1.6*  --    GFR: CrCl cannot be calculated (Unknown ideal weight.). Liver Function Tests: Recent Labs  Lab 07/27/24 1503  AST 22  ALT 11  ALKPHOS 149*  BILITOT 0.7  PROT 6.7  ALBUMIN 3.6   No results for input(s): LIPASE, AMYLASE in the last 168 hours. No results for input(s): AMMONIA in the last 168 hours. Coagulation Profile: Recent Labs  Lab 07/27/24 1503  INR 1.0   Cardiac Enzymes: No results for input(s): CKTOTAL, CKMB, CKMBINDEX, TROPONINI in the last 168 hours. BNP (last 3 results) No results for input(s): PROBNP in the last 8760 hours. HbA1C: Recent Labs    07/27/24 2321  HGBA1C 5.3   CBG: Recent Labs  Lab 07/27/24 1459  GLUCAP 106*   Lipid Profile: Recent Labs    07/27/24 2321  CHOL 134  HDL 49  LDLCALC 73  TRIG 62  CHOLHDL 2.7   Thyroid  Function Tests: No results for input(s): TSH, T4TOTAL, FREET4, T3FREE, THYROIDAB in the last 72 hours. Anemia Panel: No results for input(s): VITAMINB12, FOLATE, FERRITIN, TIBC, IRON, RETICCTPCT in the last 72 hours. Sepsis Labs: No results for input(s): PROCALCITON, LATICACIDVEN in the last 168 hours.  No results found for this or any previous visit (from the past 240 hours).    Radiology Studies: MR Angio Head WO CONTRAST Addendum Date: 07/28/2024 ADDENDUM REPORT: 07/28/2024 10:46 ADDENDUM: Impression #2 called by telephone at the time of interpretation on 07/28/2024 at 10:43 am to provider Dr. Rojelio, who verbally acknowledged these results. Electronically Signed   By: Rockey Childs D.O.   On: 07/28/2024 10:46   Result Date: 07/28/2024 CLINICAL DATA:  Provided history: Neuro deficit, acute, stroke suspected. EXAM: MRA HEAD WITHOUT CONTRAST  TECHNIQUE: Angiographic images of the Circle of Willis were acquired using MRA technique without intravenous contrast. COMPARISON:  Same-day brain MRI 07/28/2024. Non-contrast head CT and CT angiogram head/neck 07/27/2024. FINDINGS: Anterior circulation: The intracranial internal carotid arteries are patent. Mild atherosclerotic irregularity of both vessels. The left middle cerebral artery M1 segment is occluded at its mid-to-distal aspect. There is some flow related signal within M2 and more distal left MCA branches, likely due to collateral flow. The right middle cerebral artery M1 segment is patent. No right M2 proximal branch occlusion or high-grade proximal stenosis is identified. The anterior cerebral arteries are patent. 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 Posterior circulation: The proximal intracranial vertebral  arteries, and proximal right posterior inferior cerebellar artery, are excluded from the field of view. The visible intracranial vertebral arteries are patent. The basilar artery is patent. The posterior cerebral arteries are patent. Redemonstrated moderate stenosis within the right posterior cerebral artery P2 segment. A right posterior communicating artery is present. The left posterior communicating artery is diminutive or absent. Anatomic variants: As described. Attempts are being made to reach the ordering provider at this time. IMPRESSION: 1. The proximal intracranial vertebral arteries, and proximal right posterior inferior cerebellar artery (PICA), are excluded from the field of view. Within this limitation, findings are as follows. 2. The left middle cerebral artery M1 segment is occluded at its mid-to-distal aspect. 3. Redemonstrated moderate stenosis within the right posterior cerebral artery P2 segment. 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 Electronically Signed: By: Rockey Childs  D.O. On: 07/28/2024 10:15   EEG adult Result Date: 07/28/2024 Shelton Arlin KIDD, MD     07/28/2024 10:43 AM Patient Name: CHRYSTEN WOULFE MRN: [id number] Epilepsy Attending: Arlin KIDD Shelton Referring Physician/Provider: Rosemarie Eather RAMAN, MD Date: 07/28/2024 Duration: 22.30 mins Patient history: 78 y.o. female with hx of bipolar 1 diorder, HTN and obesity, who presents to the ED from home via EMS as a Code Stroke, after family noted that she suddenly could not talk.  EEG to evaluate for seizure Level of alertness: Awake, asleep AEDs during EEG study: None Technical aspects: This EEG study was done with scalp electrodes positioned according to the 10-20 International system of electrode placement. Electrical activity was reviewed with band pass filter of 1-70Hz , sensitivity of 7 uV/mm, display speed of 4mm/sec with a 60Hz  notched filter applied as appropriate. EEG data were recorded continuously and digitally stored.  Video monitoring was available and reviewed as appropriate. Description: The posterior dominant rhythm consists of 8-9 Hz activity of moderate voltage (25-35 uV) seen predominantly in posterior head regions, symmetric and reactive to eye opening and eye closing. Sleep was characterized by vertex waves, sleep spindles (12 to 14 Hz), maximal frontocentral region.  There is continuous sharply contoured 3 to 6 Hz theta-delta slowing in left fronto-temporo-parietal region. Hyperventilation and photic stimulation were not performed.   ABNORMALITY - Continuous slow, left fronto-temporo-parietal region IMPRESSION: This study is suggestive of cortical dysfunction arising from left fronto-temporo-parietal region likely secondary to underlying structural abnormality. No seizures or definite epileptiform discharges were seen throughout the recording. If suspicion for interictal activity remains a concern, a prolonged study including sleep can be considered. Arlin KIDD Shelton   MR Brain Wo Contrast (neuro  protocol) Result Date: 07/28/2024 EXAM: MRI BRAIN WITHOUT CONTRAST 07/28/2024 04:55:37 AM TECHNIQUE: Multiplanar multisequence MRI of the head/brain was performed without the administration of intravenous contrast. BLADE sequences were utilized to compensate for patient motion. The study is mildly degraded by patient motion. COMPARISON: CT of the head dated 07/27/2024 and MRI of the head dated 02/23/2022. CLINICAL HISTORY: Neuro deficit, acute, stroke suspected. Patient confused, not able to hold still. BLADE sequences utilized to compensate for motion. FINDINGS: BRAIN AND VENTRICLES: There is restricted diffusion within the cortex of the left frontal, parietal and temporal lobes compatible with acute non-hemorrhagic MCA distribution infarction. There is subcortical edema with mild cerebral swelling but no significant mass effect or midline shift. There is mild-to-moderate subcortical cerebral white matter disease. No intracranial hemorrhage. No mass. No midline shift. No hydrocephalus. The sella is unremarkable. Normal flow voids. ORBITS: No acute abnormality. SINUSES AND MASTOIDS: No acute abnormality.  BONES AND SOFT TISSUES: Normal marrow signal. No acute soft tissue abnormality. IMPRESSION: 1. Acute non-hemorrhagic left MCA territory infarction involving the frontal, parietal, and temporal lobes with associated subcortical edema and mild cerebral swelling. No significant mass effect or midline shift. 2. Mild-to-moderate subcortical white matter disease. 3. Mild motion-related image degradation. Electronically signed by: Evalene Coho MD 07/28/2024 05:06 AM EDT RP Workstation: [id number]   CT ANGIO HEAD NECK W WO CM (CODE STROKE) Addendum Date: 07/27/2024 ADDENDUM REPORT: 07/27/2024 16:34 ADDENDUM: Due to motion on the initial acquisition, a repeat scan was performed. Unchanged appearance of the M1 and more distal left middle cerebral artery vessels. Occlusion or severe near-occlusive stenosis of the left  middle cerebral artery mid and distal M1 segment. Beyond this, there is apparent occlusion of multiple left middle cerebral artery branches (beginning at the proximal M2 segment level). These results were called by telephone at the time of interpretation on 07/27/2024 at 4:25 pm to provider ERIC [facility] , who verbally acknowledged these results. The previously described stenosis of the paraclinoid left ICA appears moderate-to-severe on the most recent CTA acquisition. The previously described stenosis within the right posterior cerebral artery P2 segment appears severe on the most recent CTA acquisition. Electronically Signed   By: Rockey Childs D.O.   On: 07/27/2024 16:34   Result Date: 07/27/2024 CLINICAL DATA:  Provided history: Neuro deficit, acute, stroke suspected. EXAM: CT ANGIOGRAPHY HEAD AND NECK WITH AND WITHOUT CONTRAST TECHNIQUE: Multidetector CT imaging of the head and neck was performed using the standard protocol during bolus administration of intravenous contrast. Multiplanar CT image reconstructions and MIPs were obtained to evaluate the vascular anatomy. Carotid stenosis measurements (when applicable) are obtained utilizing NASCET criteria, using the distal internal carotid diameter as the denominator. RADIATION DOSE REDUCTION: This exam was performed according to the departmental dose-optimization program which includes automated exposure control, adjustment of the mA and/or kV according to patient size and/or use of iterative reconstruction technique. CONTRAST:  Administered contrast not known at this time. COMPARISON:  Noncontrast head CT performed earlier today 07/27/2024. FINDINGS: CTA NECK FINDINGS Aortic arch: Standard aortic branching. Minimal atherosclerotic plaque within the aortic arch. Streak/beam hardening artifact arising from a dense contrast bolus partially obscures the left subclavian artery. Within this limitation, there is no appreciable hemodynamically significant innominate or  proximal subclavian artery stenosis. Right carotid system: CCA and ICA patent within the neck without stenosis or significant atherosclerotic disease. Partially retropharyngeal course of the cervical ICA. Left carotid system: CCA and ICA patent within the neck without stenosis. Minimal atherosclerotic plaque about the carotid bifurcation. Vertebral arteries: Venous reflux of contrast partially obscures the left vertebral artery proximal V1 segment. Within this limitation, the vertebral arteries are codominant and patent within the neck without stenosis or significant aspect disease. Skeleton: Prior C3-C5 ACDF. Cervical and thoracic spondylosis. No acute fracture or aggressive osseous lesion. Other neck: Multiple thyroid  nodules (the largest an exophytic nodule projecting inferiorly from the lower pole of the right thyroid  lobe measuring 3 cm). This nodule extends into the superior mediastinum. Upper chest: No consolidation within the imaged lung apices. Review of the MIP images confirms the above findings CTA HEAD FINDINGS Moderately motion degraded examination. Within this limitation, findings are as follows. Anterior circulation: The intracranial internal carotid arteries are patent. Atherosclerotic plaque within both vessels. Most notably, there is up to moderate stenosis of the paraclinoid ICAs, bilaterally. Occlusion versus severe near-occlusive stenosis within the left middle cerebral artery mid-to-distal M1 segment (for instance as seen  on series 10, image 19). Beyond this, multiple left MCA branches are poorly delineated (beginning at the proximal M2 segment level) and are likely occluded. The right middle cerebral artery M1 segment is patent. No right M2 proximal branch occlusion or high-grade proximal stenosis is identified. The anterior cerebral arteries are patent. No intracranial aneurysm is identified. Posterior circulation: The intracranial vertebral arteries are patent. The basilar artery is patent.  The posterior cerebral arteries are patent. Moderate stenosis within the right posterior cerebral artery P2 segment. Posterior communicating arteries are diminutive or absent, bilaterally. Venous sinuses: Within the limitations of contrast timing, no convincing thrombus. Anatomic variants: As described. Review of the MIP images confirms the above findings CTA head impressiopn #2 called by telephone at the time of interpretation on 07/27/2024 at 3:35 pm to provider ERIC [facility] , who verbally acknowledged these results. IMPRESSION: CTA neck: 1. The common carotid and internal carotid arteries are patent within the neck without stenosis. Minimal atherosclerotic plaque on the left. 2. Venous reflux of contrast partially obscures the left vertebral artery proximal V1 segment. Within this limitation, the vertebral arteries are patent within the neck without stenosis or significant atherosclerotic disease. 3. Aortic Atherosclerosis (ICD10-I70.0). 4. 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 A nonemergent thyroid  ultrasound is recommended for further evaluation. Reference: J Am Coll Radiol. 2015 Feb;12(2): 143-50. CTA head: 1. Moderately motion degraded examination. Within this limitation, findings are as follows. 2. Occlusion versus severe-near occlusive stenosis of the left middle cerebral artery mid-to-distal M1 segment. Beyond this, multiple left MCA branches are also poorly delineated (beginning at the proximal M2 segment level) and are likely occluded. 3. Background intracranial atherosclerotic disease as described within the body of the report. Up to moderate stenosis of the paraclinoid internal carotid arteries, bilaterally. Moderate stenosis within the right posterior cerebral artery P2 segment. Electronically Signed: By: Rockey Childs D.O. On: 07/27/2024 15:51   CT HEAD CODE STROKE WO CONTRAST Result Date: 07/27/2024 CLINICAL DATA:  Code stroke. Provided history: Neuro deficit, acute, stroke suspected.  EXAM: CT HEAD WITHOUT CONTRAST TECHNIQUE: Contiguous axial images were obtained from the base of the skull through the vertex without intravenous contrast. RADIATION DOSE REDUCTION: This exam was performed according to the departmental dose-optimization program which includes automated exposure control, adjustment of the mA and/or kV according to patient size and/or use of iterative reconstruction technique. COMPARISON:  Brain MRI 02/23/2022.  Head CT 02/23/2022. FINDINGS: Brain: Generalized cerebral atrophy. A cortical/subcortical infarct within the left parietal lobe has progressed in extent as compared to the brain MRI of 02/23/2022. Small age-indeterminate cortical infarct within the mid to posterior left frontal lobe, new from the prior MRI (series 5, image 37) (series 2, image 23). Background mild patchy and ill-defined hypoattenuation within the cerebral white matter, nonspecific but compatible with chronic small vessel image disease. There is no acute intracranial hemorrhage. No extra-axial fluid collection. No evidence of an intracranial mass. No midline shift. Vascular: No hyperdense vessel.  Atherosclerotic calcifications. Skull: No calvarial fracture or aggressive osseous lesion. Sinuses/Orbits: No mass or acute finding within the imaged orbits. Trace consult thickening within the bilateral ethmoid sinuses. ASPECTS [facility] Stroke Program Early CT Score) - Ganglionic level infarction (caudate, lentiform nuclei, internal capsule, insula, M1-M3 cortex): 6 - Supraganglionic infarction (M4-M6 cortex): 1 Total score (0-10 with 10 being normal): 7 Age-indeterminate infarcts within the left frontal and left parietal lobes. These results were communicated to Dr. Lindzen at 3:19 pmon 10/4/2025by text page via the [facility] messaging system. IMPRESSION:  1. Small age-indeterminate cortical infarct within the mid-to-posterior left frontal lobe, new from the prior brain MRI of 02/23/2022. Additionally, a left parietal  lobe cortical/subcortical infarct has increased in extent raising the possibility of an acute on chronic infarct at this site. Consider a brain MRI for further evaluation. ASPECTS is 7. 2. Background parenchymal atrophy and chronic small vessel ischemic disease. Electronically Signed   By: Rockey Childs D.O.   On: 07/27/2024 15:20      Scheduled Meds:   stroke: early stages of recovery book   Does not apply Once   atorvastatin  40 mg Oral Daily   clopidogrel  75 mg Oral Daily   DULoxetine   30 mg Oral Daily   enoxaparin  (LOVENOX ) injection  40 mg Subcutaneous Daily   isosorbide -hydrALAZINE   1 tablet Oral TID   QUEtiapine   50 mg Oral QHS   Continuous Infusions:     LOS: 2 days   Time spent: 25 minutes   Delon Hoe, DO [facility] 07/29/2024, 10:34 AM   Available via Epic secure chat 7am-7pm After these hours, please refer to coverage provider listed on [email or web address]

## 2024-07-30 LAB — CBC
HCT: 22.4 % — ABNORMAL LOW (ref 36.0–46.0)
Hemoglobin: 7.2 g/dL — ABNORMAL LOW (ref 12.0–15.0)
MCH: 23.6 pg — ABNORMAL LOW (ref 26.0–34.0)
MCHC: 32.1 g/dL (ref 30.0–36.0)
MCV: 73.4 fL — ABNORMAL LOW (ref 80.0–100.0)
Platelets: 174 K/uL (ref 150–400)
RBC: 3.05 MIL/uL — ABNORMAL LOW (ref 3.87–5.11)
RDW: 16.2 % — ABNORMAL HIGH (ref 11.5–15.5)
WBC: 5.4 K/uL (ref 4.0–10.5)
nRBC: 0 % (ref 0.0–0.2)

## 2024-07-30 LAB — BASIC METABOLIC PANEL WITH GFR
Anion gap: 8 (ref 5–15)
BUN: 9 mg/dL (ref 8–23)
CO2: 23 mmol/L (ref 22–32)
Calcium: 10.6 mg/dL — ABNORMAL HIGH (ref 8.9–10.3)
Chloride: 111 mmol/L (ref 98–111)
Creatinine, Ser: 1.07 mg/dL — ABNORMAL HIGH (ref 0.44–1.00)
GFR, Estimated: 54 mL/min — ABNORMAL LOW (ref >60)
Glucose, Bld: 107 mg/dL — ABNORMAL HIGH (ref 70–99)
Potassium: 3.8 mmol/L (ref 3.5–5.1)
Sodium: 142 mmol/L (ref 135–145)

## 2024-07-30 LAB — MAGNESIUM: Magnesium: 1.9 mg/dL (ref 1.7–2.4)

## 2024-07-30 NOTE — Care Management Important Message (Signed)
 Important Message  Patient Details  Name: Christie Meza MRN: [id number] Date of Birth: 15-Apr-1946   Important Message Given:  Yes - Medicare IM     Claretta Deed 07/30/2024, 3:31 PM

## 2024-07-30 NOTE — Progress Notes (Signed)
 PT Cancellation Note  Patient Details Name: Christie Meza MRN: [id number] DOB: [birth year]   Cancelled Treatment:    Reason Eval/Treat Not Completed: (P) Patient declined, no reason specified (Pt reports she wants to rest and declines mobility despite encouragement. Will continue to follow per PT POC.)   Darryle George 07/30/2024, 4:10 PM

## 2024-07-30 NOTE — PMR Pre-admission (Signed)
 PMR Admission Coordinator Pre-Admission Assessment  Patient: Christie Meza is an 78 y.o., female MRN: [id number] DOB: 1946/07/09 Height:   Weight: 106 kg  Insurance Information HMO: yes    PPO:      PCP:      IPA:      80/20:      OTHER:  PRIMARY: [employer]      Policy#: [id number]       Subscriber: pt CM Name: Corean      Phone#: [phone]     Fax#: [phone] Pre-Cert#: [id number]  auth for CIR from [place] [facility] for admit 10/9 with next review date 10/15.  Updates due to fax listed above.        Employer:  Benefits:  Phone #: [phone]     Name:  Eff. Date: 10/25/23     Deduct: $0      Out of Pocket Max: [phone] ($0 met)      Life Max:  CIR: $435/day for days 1-4      SNF: 20 full days Outpatient:      Co-Pay: $30/visit Home Health: 100%      Co-Pay:  DME: 80%     Co-Pay: 20% Providers:  SECONDARY:       Policy#:      Phone#:   Artist:       Phone#:   The Engineer, materials Information Summary" for patients in Inpatient Rehabilitation Facilities with attached "Privacy Act Statement-Health Care Records" was provided and verbally reviewed with: Patient and Family  Emergency Contact Information Contact Information     Name Relation Home Work Mobile   [place] Daughter [phone]        Other Contacts   None on File     Current Medical History  Patient Admitting Diagnosis: CVA   History of Present Illness: Pt is a 78 yo female with PMH Of HTN, bipolar, obesity, previous CVA, who presented to [facility] on 10/4 with inability to talk with associated right facial droop and right sided weakness. CT head revealed mid to posterior located left frontal lobe infarct as well as left parietal lobe involvement.  MRI notable for acute nonhemorrhagic left MCA infarct involving frontal, parietal, and temporal lobes with subcortical edema and cerebral swelling.  MRA noted occlusion of the left MCA/M1 segment with moderate  stenosis in the right posterior cerebral artery p2 segment.  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  EEG showed continuous slow activity in the left frontal temporal parietal lobes.  EF >75% with hyperdynamic function of LV and grade 1 diastolic dysfunction.   Neurology recommended aspirin  and plavix for three months, then aspirin  alone, but pt with aspirin  allergy so will do plavix alone.  Recommended 30-day heart monitor at discharge.  Hospital course microcytic anemia, permissive HTN.  Pt is tolerating a D1/thin liquid diet.  Therapy ongoing and recommendations are for CIR for multidisciplinary team approach for stroke rehab.   Complete NIHSS TOTAL: 8  Patient's medical record from [facility] has been reviewed by the rehabilitation admission coordinator and physician.  Past Medical History  Past Medical History:  Diagnosis Date   Bipolar 1 disorder (HCC)    Hypertension    Obesity     Has the patient had major surgery during 100 days prior to admission? No  Family History   family history includes Hypertension in her mother; Kidney disease in her brother and mother.  Current Medications  Current Facility-Administered  Medications:    acetaminophen  (TYLENOL ) tablet 650 mg, 650 mg, Oral, Q4H PRN, 650 mg at 07/30/24 1555 **OR** acetaminophen  (TYLENOL ) 160 MG/5ML solution 650 mg, 650 mg, Per Tube, Q4H PRN **OR** acetaminophen  (TYLENOL ) suppository 650 mg, 650 mg, Rectal, Q4H PRN, Sim, Mohammad L, MD   atorvastatin (LIPITOR) tablet 40 mg, 40 mg, Oral, Daily, Garba, Mohammad L, MD, 40 mg at 08/01/24 0913   clopidogrel (PLAVIX) tablet 75 mg, 75 mg, Oral, Daily, Lindzen, Eric, MD, 75 mg at 08/01/24 0913   DULoxetine  (CYMBALTA ) DR capsule 30 mg, 30 mg, Oral, Daily, Rojelio Nest, DO, 30 mg at 08/01/24 0913   enoxaparin  (LOVENOX ) injection 40 mg, 40 mg, Subcutaneous, Daily, Sim, Mohammad L, MD, 40 mg at 08/01/24 9085   hydrALAZINE  (APRESOLINE ) injection 10 mg, 10 mg,  Intravenous, Q6H PRN, Rojelio Nest, DO, 10 mg at 07/31/24 0406   isosorbide -hydrALAZINE  (BIDIL ) 20-37.5 MG per tablet 1 tablet, 1 tablet, Oral, TID, Rojelio Nest, DO, 1 tablet at 08/01/24 0913   losartan (COZAAR) tablet 25 mg, 25 mg, Oral, Daily, Briana Elgin LABOR, MD, 25 mg at 08/01/24 0913   QUEtiapine  (SEROQUEL ) tablet 50 mg, 50 mg, Oral, QHS, Rojelio Nest, DO, 50 mg at 07/31/24 2131   senna-docusate (Senokot-S) tablet 1 tablet, 1 tablet, Oral, QHS PRN, Sim Emery CROME, MD  Patients Current Diet:  Diet Order             DIET - DYS 1 Room service appropriate? Yes with Assist; Fluid consistency: Thin  Diet effective now                   Precautions / Restrictions Precautions Precautions: Fall, Other (comment) Precaution/Restrictions Comments: R inattention Restrictions Weight Bearing Restrictions Per Provider Order: No   Has the patient had 2 or more falls or a fall with injury in the past year? No  Prior Activity Level Limited Community (1-2x/wk): indep prior to admit occasional use of SPC but dtr states didn't use it, not driving  Prior Functional Level Self Care: Did the patient need help bathing, dressing, using the toilet or eating? Independent  Indoor Mobility: Did the patient need assistance with walking from room to room (with or without device)? Independent  Stairs: Did the patient need assistance with internal or external stairs (with or without device)? Independent  Functional Cognition: Did the patient need help planning regular tasks such as shopping or remembering to take medications? Independent  Patient Information Are you of Hispanic, Latino/a,or Spanish origin?: A. No, not of Hispanic, Latino/a, or Spanish origin What is your race?: B. Black or African American Do you need or want an interpreter to communicate with a doctor or health care staff?: 0. No  Patient's Response To:  Health Literacy and Transportation Is the patient able to respond to  health literacy and transportation needs?: Yes Health Literacy - How often do you need to have someone help you when you read instructions, pamphlets, or other written material from your doctor or pharmacy?: Never In the past 12 months, has lack of transportation kept you from medical appointments or from getting medications?: No In the past 12 months, has lack of transportation kept you from meetings, work, or from getting things needed for daily living?: No  Journalist, newspaper / Equipment Home Equipment: Shower seat, Medical laboratory scientific officer - single point  Prior Device Use: Indicate devices/aids used by the patient prior to current illness, exacerbation or injury? cane  Current Functional Level Cognition  Arousal/Alertness: Awake/alert Overall Cognitive Status: Impaired/Different  from baseline Orientation Level: Oriented X4 Attention: Focused, Sustained Focused Attention: Impaired Focused Attention Impairment: Verbal basic, Verbal complex, Functional basic, Functional complex Sustained Attention: Impaired Sustained Attention Impairment: Verbal basic, Verbal complex, Functional basic, Functional complex Memory: Impaired Memory Impairment: Decreased recall of new information Awareness: Impaired Awareness Impairment: Intellectual impairment, Emergent impairment Problem Solving: Impaired Problem Solving Impairment: Functional basic, Verbal basic, Verbal complex, Functional complex Executive Function: Sequencing, Organizing, Self Monitoring Sequencing: Impaired Organizing: Impaired Self Monitoring: Impaired Safety/Judgment: Impaired    Extremity Assessment (includes Sensation/Coordination)  Upper Extremity Assessment: Defer to OT evaluation, RUE deficits/detail RUE Deficits / Details: able to use with self care task but often drops washcloth without being aware RUE Coordination: decreased fine motor, decreased gross motor  Lower Extremity Assessment: Defer to PT evaluation    ADLs  Overall  ADL's : Needs assistance/impaired Eating/Feeding: Sitting, Minimal assistance Grooming: Wash/dry hands, Wash/dry face, Oral care, Minimal assistance, Sitting, Cueing for sequencing Grooming Details (indicate cue type and reason): at sink with min assist for toothpaste Upper Body Bathing: Moderate assistance, Sitting, Cueing for sequencing Upper Body Bathing Details (indicate cue type and reason): patient using RUE to wash left side with patient often dropping washcloth Lower Body Bathing: Moderate assistance, Maximal assistance, Sit to/from stand Lower Body Bathing Details (indicate cue type and reason): able to bathe upper legs while standing and required max assist for peri area back Upper Body Dressing : Minimal assistance, Sitting Upper Body Dressing Details (indicate cue type and reason): gown change Lower Body Dressing: Maximal assistance, Sitting/lateral leans Toilet Transfer: Moderate assistance, Ambulation, Rolling walker (2 wheels) Toilet Transfer Details (indicate cue type and reason): simulated around bed Toileting- Clothing Manipulation and Hygiene: Maximal assistance Functional mobility during ADLs: Minimal assistance, Rolling walker (2 wheels) General ADL Comments: encouraged patient to use LUE with bathing    Mobility  Overal bed mobility: Needs Assistance Bed Mobility: Supine to Sit Supine to sit: Contact guard Sit to supine: Min assist General bed mobility comments: patient requiring CGA to complete    Transfers  Overall transfer level: Needs assistance Equipment used: Rolling walker (2 wheels) Transfers: Sit to/from Stand, Bed to chair/wheelchair/BSC Sit to Stand: Min assist Bed to/from chair/wheelchair/BSC transfer type:: Step pivot Step pivot transfers: Min assist General transfer comment: min assist to stand from EOB and to ambulate to sink and then to recliner, cues for hand placement and often assistance to keep RUE on RW    Ambulation / Gait / Stairs /  Wheelchair Mobility  Ambulation/Gait Ambulation/Gait assistance: Editor, commissioning (Feet): 8 Feet Assistive device: Rolling walker (2 wheels), None Gait Pattern/deviations: Step-through pattern, Decreased step length - right, Drifts right/left General Gait Details: Assist for RW management with pt trying to ambulate with no AD. Slightly unsteady with difficulty sequencing RW for gait Gait velocity: decr Gait velocity interpretation: <1.8 ft/sec, indicate of risk for recurrent falls    Posture / Balance Dynamic Sitting Balance Sitting balance - Comments: EOB Balance Overall balance assessment: Needs assistance Sitting-balance support: No upper extremity supported, Feet supported Sitting balance-Leahy Scale: Fair Sitting balance - Comments: EOB Postural control: Posterior lean Standing balance support: Single extremity supported, Bilateral upper extremity supported, During functional activity Standing balance-Leahy Scale: Poor Standing balance comment: reliant on UE support when standing    Special considerations/life events  Diabetic management A1c 5.3 and Behavioral consideration bipolar   Previous Home Environment (from acute therapy documentation) Living Arrangements: Spouse/significant other  Lives With: Other (Comment) (pt states spouse but unsure if  reliable hx and novel aphasia (past notes state grandson assisted in 2023)) Available Help at Discharge: Family Type of Home: House Home Layout: One level Home Access: Level entry Bathroom Shower/Tub: Tub/shower unit Additional Comments: Unclear if accurate home set-up  Discharge Living Setting Plans for Discharge Living Setting: Patient's home, Lives with (comment) (s/o [name]) Type of Home at Discharge: House Discharge Home Layout: One level Discharge Home Access: Level entry Discharge Bathroom Shower/Tub: Tub/shower unit Discharge Bathroom Toilet: Handicapped height Discharge Bathroom Accessibility: Yes How  Accessible: Accessible via walker Does the patient have any problems obtaining your medications?: No  Social/Family/Support Systems Patient Roles: Partner Contact Information: [name] Dales Anticipated Caregiver: Daughter, [name] LABOR. Anticipated Caregiver's Contact Information: [phone] Ability/Limitations of Caregiver: none stated Caregiver Availability: 24/7 Discharge Plan Discussed with Primary Caregiver: Yes Is Caregiver In Agreement with Plan?: Yes Does Caregiver/Family have Issues with Lodging/Transportation while Pt is in Rehab?: No  Goals Patient/Family Goal for Rehab: PT/OT supervision, SLP supervision to min Expected length of stay: 7-10 days Additional Information: discharge plan: return to pt's home (first choice and set up above) with partner, [name], and daughter, [name], providing 24/7 supervision Pt/Family Agrees to Admission and willing to participate: Yes Program Orientation Provided & Reviewed with Pt/Caregiver Including Roles  & Responsibilities: Yes  Decrease burden of Care through IP rehab admission: n/a  Possible need for SNF placement upon discharge: Not anticipated.  Pt plan to d/c back to her home (preference) with s/o and daughter providing 24/7 supervision.  Daughter [place]) okay with pt discharging to her home if needed.   Patient Condition: This patient's condition remains as documented in the consult dated 07/30/24, in which the Rehabilitation Physician determined and documented that the patient's condition is appropriate for intensive rehabilitative care in an inpatient rehabilitation facility. Will admit to inpatient rehab pending insurance approval today.  Preadmission Screen Completed By:  Reche FORBES Lowers, PT, DPT 08/01/2024 3:39 PM ______________________________________________________________________   Discussed status with Dr. Emeline on 08/01/24 at 1539 and received approval for admission today.  Admission Coordinator:  Caitlin E Warren, PT, DPT time 1539  Date 08/01/24   Assessment/Plan: Diagnosis: 78 year old female with left MCA infarct and associated right hemiparesis and aphasia Does the need for close, 24 hr/day medical supervision in concert with the patient's rehab needs make it unreasonable for this patient to be served in a less intensive setting? Yes Co-Morbidities requiring supervision/potential complications:  - Post stroke dysphagia -Hypertension -Microcytic anemia Due to bladder management, bowel management, safety, skin/wound care, disease management, medication administration, pain management, and patient education, does the patient require 24 hr/day rehab nursing? Yes Does the patient require coordinated care of a physician, rehab nurse, therapy disciplines of PT, OT, SLP to address physical and functional deficits in the context of the above medical diagnosis(es)? Yes Addressing deficits in the following areas: balance, endurance, locomotion, strength, transferring, bowel/bladder control, bathing, dressing, feeding, grooming, toileting, speech, language, swallowing, and psychosocial support Can the patient actively participate in an intensive therapy program of at least 3 hrs of therapy per day at least 5 days per week? Yes The potential for patient to make measurable gains while on inpatient rehab is excellent Anticipated functional outcomes upon discharge from inpatient rehab are supervision  with PT, supervision with OT, supervision and min assist with SLP. Estimated rehab length of stay to reach the above functional goals is: 7-10 days Anticipated discharge destination: Home Overall Rehab/Functional Prognosis: excellent   MD Signature:  Joesph JAYSON Emeline, DO 08/01/2024

## 2024-07-30 NOTE — Progress Notes (Signed)
 PROGRESS NOTE    Christie Meza  FMW:[id number] DOB: July 25, 1946 DOA: 07/27/2024 PCP: Shelda Atlas, MD     Brief Narrative:  Christie Meza is a 78 y.o. female with medical history significant of essential hypertension, hyperlipidemia, bipolar disorder, chronic pain syndrome, history of alcohol abuse, who presented to the ER via EMS with right-sided facial droop and some slurred speech.  Patient apparently was last known normal about 7:00 last night according to daughter.  About 1:30 PM this afternoon she called the daughter telling her that she is on the road to speak well.  Not sure of the timing.  When EMS arrive she did have a right facial droop and bilateral leg weakness.  There was global aphasia and dysarthria.  Patient was deemed not a candidate for tPA administration due to unknown duration and last known normal.  She has been seen by neurology and patient had initial head CT that suggested a small CVA In the mid to posterior left frontal lobe.   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 No seizures or definite epileptiform discharges were seen throughout the recording.  - Echocardiogram showed EF >75%, hyperdynamic function of LV.  Grade 1 diastolic dysfunction - Plavix, Lipitor.  Patient has listed allergy to aspirin .  Discussed with Dr. Rosemarie, recommend Plavix only - PT OT SLP - Stroke team signed  off 10/6.  Recommend outpatient stroke clinic follow-up in 2 months.  Recommend 30-day heart monitor at discharge - Dysphagia 1 diet - CIR pending insurance Auth  Hyperlipidemia - Lipitor   Hypertension - Isosorbide -hydralazine   Bipolar disorder - Seroquel , Cymbalta   Obesity Estimated body mass index is 38.89 kg/m as calculated from the following:   Height as of 02/24/22: 5' 5 (1.651 m).   Weight as of this encounter: 106 kg.  Microcytic anemia - Baseline hemoglobin around 9-10.  No overt evidence of bleeding.  Continue to monitor.  Check iron levels.    DVT prophylaxis:  enoxaparin  (LOVENOX ) injection 40 mg Start: 07/28/24 1000  Code Status: Full code Family Communication: None at bedside Disposition Plan: CIR Status is: Inpatient Remains inpatient appropriate because: CIR pending insurance auth   Antimicrobials:  Anti-infectives (From admission, onward)    None        Objective: Vitals:   07/29/24 2015 07/29/24 2328 07/30/24 0425 07/30/24 0717  BP: (!) 148/77 139/60 (!) 142/77 (!) 161/74  Pulse:    91  Resp: 17 16 20 16   Temp: 98.8 F (37.1 C) 98 F (36.7 C) 98.6 F (37 C) 98.7 F (37.1 C)  TempSrc: Oral  Oral Oral  SpO2: 100% 100% 100% 100%  Weight:        Intake/Output Summary (Last 24 hours) at 07/30/2024 1100 Last data filed at 07/30/2024 0425 Gross per 24 hour  Intake 240 ml  Output 400 ml  Net -160 ml   [employer]  07/27/24 1500  Weight: 106 kg    Examination:  General exam: Appears calm and comfortable  Respiratory system: Clear to auscultation. Respiratory effort normal. Cardiovascular system: S1 & S2 heard, RRR. Gastrointestinal system: Abdomen is nondistended, soft  Central nervous system: Alert and oriented to self only. +dysarthric speech, moves all extremities to command Extremities: Symmetric in appearance   Data Reviewed: I have personally reviewed following labs and imaging studies  CBC: Recent Labs  Lab  07/27/24 1503 07/27/24 1505 07/27/24 2321 07/29/24 0519 07/30/24 0526  WBC 5.3  --  6.1 5.4 5.4  NEUTROABS 3.2  --   --   --   --   HGB 8.1* 8.5* 7.9* 7.3* 7.2*  HCT 25.5* 25.0* 24.4* 22.8* 22.4*  MCV 75.0*  --  74.4* 73.8* 73.4*  PLT 194  --  187 163 174   Basic Metabolic Panel: Recent Labs  Lab 07/27/24 1503 07/27/24 1505 07/27/24 2321 07/28/24 2345 07/29/24 0519 07/30/24 0526  NA 140 143  --  142 141 142  K 3.9 4.0  --  3.3* 3.4* 3.8  CL 111 110  --  111 109 111  CO2 22  --   --  22 21* 23  GLUCOSE 104* 103*  --  96 95 107*  BUN 13 13  --  9 10 9   CREATININE 1.54* 1.60* 1.16* 1.13* 1.09* 1.07*  CALCIUM 10.7*  --   --  10.4* 10.2 10.6*  MG  --   --   --  1.6*  --  1.9   GFR: CrCl cannot be calculated (Unknown ideal weight.). Liver Function Tests: Recent Labs  Lab 07/27/24 1503  AST 22  ALT 11  ALKPHOS 149*  BILITOT 0.7  PROT 6.7  ALBUMIN 3.6   No results for input(s): LIPASE, AMYLASE in the last 168 hours. No results for input(s): AMMONIA in the last 168 hours. Coagulation Profile: Recent Labs  Lab 07/27/24 1503  INR 1.0   Cardiac Enzymes: No results for input(s): CKTOTAL, CKMB, CKMBINDEX, TROPONINI in the last 168 hours. BNP (last 3 results) No results for input(s): PROBNP in the last 8760 hours. HbA1C: Recent Labs    07/27/24 2321  HGBA1C 5.3   CBG: Recent Labs  Lab 07/27/24 1459  GLUCAP 106*   Lipid Profile: Recent Labs    07/27/24 2321  CHOL 134  HDL 49  LDLCALC 73  TRIG 62  CHOLHDL 2.7   Thyroid  Function Tests: No results for input(s): TSH, T4TOTAL, FREET4, T3FREE, THYROIDAB in the last 72 hours. Anemia Panel: No results for input(s): VITAMINB12, FOLATE, FERRITIN, TIBC, IRON, RETICCTPCT in the last 72 hours. Sepsis Labs: No results for input(s): PROCALCITON, LATICACIDVEN in the last 168 hours.  No results found for this or any previous visit (from the past 240 hours).    Radiology  Studies: ECHOCARDIOGRAM COMPLETE Result Date: 07/29/2024    ECHOCARDIOGRAM REPORT   Patient Name:   Christie Meza Date of Exam: 07/29/2024 Medical Rec #:  [id number]       Height:       65.0 in Accession #:    [id number]      Weight:       233.7 lb Date of Birth:  Jul 16, 1946       BSA:          2.113 m Patient Age:    78 years        BP:           107/46 mmHg Patient  Gender: F               HR:           85 bpm. Exam Location:  Inpatient Procedure: 2D Echo and Intracardiac Opacification Agent (Both Spectral and Color            Flow Doppler were utilized during procedure). Indications:    Stroke  History:        Patient has no prior history of Echocardiogram examinations.  Sonographer:    Charmaine Gaskins Referring Phys: [id number] MOHAMMAD L GARBA IMPRESSIONS  1. 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 Left ventricular ejection fraction, by estimation, is >75%. The left ventricle has hyperdynamic function. The left ventricle has no regional wall motion abnormalities. There is mild concentric left ventricular hypertrophy. Left ventricular diastolic parameters are consistent with Grade I diastolic dysfunction (impaired relaxation).  2. Right ventricular systolic function is normal. The right ventricular size is normal. There is mildly elevated pulmonary artery systolic pressure. The estimated right ventricular systolic pressure is 36.4 mmHg.  3. The mitral valve is normal in structure. Trivial mitral valve regurgitation. No evidence of mitral stenosis.  4. The aortic valve is normal in structure. Aortic valve regurgitation is not visualized. No aortic stenosis is present.  5. The inferior vena cava is normal in size with greater than 50% respiratory variability, suggesting right atrial pressure of 3 mmHg. Conclusion(s)/Recommendation(s): No intracardiac source of embolism detected on this transthoracic study. Consider a  transesophageal echocardiogram to exclude cardiac source of embolism if clinically indicated. 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 Left ventricular ejection fraction, by estimation, is >75%. The left ventricle has hyperdynamic function.  The left ventricle has no regional wall motion abnormalities. The left ventricular internal cavity size was normal in size. There is mild concentric left ventricular hypertrophy. Left ventricular diastolic parameters are consistent with Grade I diastolic dysfunction (impaired relaxation). Right Ventricle: The right ventricular size is normal. No increase in right ventricular wall thickness. Right ventricular systolic function is normal. There is mildly elevated pulmonary artery systolic pressure. The tricuspid regurgitant velocity is 2.89  m/s, and with an assumed right atrial pressure of 3 mmHg, the estimated right ventricular systolic pressure is 36.4 mmHg. Left Atrium: Left atrial size was normal in size. Right Atrium: Right atrial size was normal in size. Pericardium: There is no evidence of pericardial effusion. Mitral Valve: The mitral valve is normal in structure. Trivial mitral valve regurgitation. No evidence of mitral valve stenosis. Tricuspid Valve: The tricuspid valve is normal in structure. Tricuspid valve regurgitation is trivial. No evidence of tricuspid stenosis. Aortic Valve: The aortic valve is normal in structure. Aortic valve regurgitation is not visualized. No aortic stenosis is present. Pulmonic Valve: The pulmonic valve was normal in structure. Pulmonic valve regurgitation is not visualized. No evidence of pulmonic stenosis. Aorta: The aortic root is normal in size and structure. Venous: The inferior vena cava is normal in size with greater than 50% respiratory variability, suggesting right atrial pressure of 3 mmHg. IAS/Shunts: No atrial  level shunt detected by color flow Doppler.  LEFT VENTRICLE PLAX 2D LVIDd:         3.90 cm   Diastology LVIDs:         1.60 cm   LV e' medial:    0.05 cm/s LV PW:  1.20 cm   LV E/e' medial:  14.1 LV IVS:        1.30 cm   LV e' lateral:   0.07 cm/s LVOT diam:     2.10 cm   LV E/e' lateral: 10.5 LVOT Area:     3.46 cm  RIGHT VENTRICLE RV Basal diam:  2.90 cm RV Mid diam:    2.50 cm RV S prime:     15.60 cm/s LEFT ATRIUM             Index        RIGHT ATRIUM           Index LA diam:        2.70 cm 1.28 cm/m   RA Area:     12.40 cm LA Vol (A2C):   51.7 ml 24.46 ml/m  RA Volume:   26.80 ml  12.68 ml/m LA Vol (A4C):   40.9 ml 19.35 ml/m LA Biplane Vol: 46.3 ml 21.91 ml/m   AORTA Ao Root diam: 2.90 cm Ao Asc diam:  3.40 cm MITRAL VALVE                TRICUSPID VALVE MV Area (PHT): 7.59 cm     TR Peak grad:   33.4 mmHg MV Decel Time: 100 msec     TR Vmax:        289.00 cm/s MV E velocity: 0.72 cm/s MV A velocity: 101.00 cm/s  SHUNTS MV E/A ratio:  0.01         Systemic Diam: 2.10 cm Oneil Parchment MD Electronically signed by Oneil Parchment MD Signature Date/Time: 07/29/2024/1:48:21 PM    Final       Scheduled Meds:  atorvastatin  40 mg Oral Daily   clopidogrel  75 mg Oral Daily   DULoxetine   30 mg Oral Daily   enoxaparin  (LOVENOX ) injection  40 mg Subcutaneous Daily   isosorbide -hydrALAZINE   1 tablet Oral TID   QUEtiapine   50 mg Oral QHS   Continuous Infusions:     LOS: 3 days   Time spent: 25 minutes   Delon Hoe, DO [facility] 07/30/2024, 11:00 AM   Available via Epic secure chat 7am-7pm After these hours, please refer to coverage provider listed on [email or web address]

## 2024-07-30 NOTE — Consult Note (Signed)
 Physical Medicine and Rehabilitation Consult Reason for Consult:language and functional mobility deficits Referring Physician: Rosemarie   HPI: Christie Meza is a 78 y.o. female with a history of obesity, hypertension and bipolar disorder who presented on 07/27/2024 with inability to talk and right facial droop and right-sided weakness.  CT of the head revealed mid to posterior located left frontal lobe infarct as well as involvement of left parietal lobe.  MRI is notable for acute nonhemorrhagic left MCA infarct involving the frontal, parietal, and temporal lobes with associated subcortical edema and cerebral swelling.  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  EEG was done demonstrating continuous slow activity in the left frontal temporal parietal region.  Patient was placed on aspirin  and Plavix with the plan to continue this for 78-months and then moving to aspirin  alone.  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  Patient is currently on D1 thin liquid diet.  Patient was up with therapies yesterday and was min assist for sit to stand transfers and walked 50 feet using a rolling walker at a mod assist level.  Patient had frequent loss of contact of her right hand with the walker causing the walker to turn right.  Patient worked on basic ADLs who Occupational Therapy and was min to mod assist.  Patient lives in a home in a 1 level house with her spouse.  She has a single-point cane and apparently was modified independent with a cane prior to admit.    Home: Home Living Family/patient expects to be discharged to:: Private residence Living Arrangements: Spouse/significant other Available Help at Discharge: Family Type of Home: House Home Access: Level entry Home Layout: One level Bathroom Shower/Tub: Biomedical scientist: Information systems manager, Medical laboratory scientific officer - single point Additional Comments: Unclear if accurate home set-up  Lives With: Other (Comment) (pt states spouse but unsure if reliable hx and novel aphasia (past notes state grandson assisted in 2023))  Functional History: Prior Function Prior Level of Function : Independent/Modified Independent, Patient poor historian/Family not available Mobility Comments: Pt reports being ModI with SP cane, unclear if accurate PLOF. Chart review from 2023 stated pt had assist for transfers ADLs Comments: Pt reports being Ind with ADLs and iADLs. Unclear if accurate PLOF. Chart review in 2023 stated pt had assist for ADLs. Does not drive Functional Status:  Mobility: Bed Mobility Overal bed mobility: Needs Assistance Bed Mobility: Supine to Sit Supine to sit: Min assist Sit to supine: Min assist General bed mobility comments: Assist for safety and to make sure RUE isn't left behind. Transfers Overall transfer level: Needs assistance Equipment used: None, Rolling walker (2 wheels) Transfers: Sit to/from Stand Sit to Stand: Min assist General transfer comment: Assist for balance and safety. Verbal cues for hand placement Ambulation/Gait Ambulation/Gait assistance: Mod assist Gait Distance (Feet): 50 Feet Assistive device: Rolling walker (2 wheels) Gait Pattern/deviations: Step-through pattern, Decreased step length - right, Drifts right/left General Gait Details: Assist for balance, to manage walker, and for safety. Pt with frequent loss of contact with correct placement of rt hand on the walker causing the walker to turn to the rt and pt unaware/unable to correct. Gait velocity: decr Gait velocity interpretation: <1.8 ft/sec, indicate of risk for recurrent falls    ADL: ADL Overall ADL's : Needs assistance/impaired Eating/Feeding: Sitting, Minimal assistance Grooming: Minimal assistance,  Sitting Upper Body Bathing: Moderate assistance, Sitting Lower  Body Bathing: Maximal assistance, Sitting/lateral leans Upper Body Dressing : Moderate assistance, Sitting Lower Body Dressing: Maximal assistance, Sitting/lateral leans Toilet Transfer: Moderate assistance, Ambulation, Rolling walker (2 wheels) Toilet Transfer Details (indicate cue type and reason): simulated around bed Toileting- Clothing Manipulation and Hygiene: Maximal assistance Functional mobility during ADLs: Moderate assistance, Rolling walker (2 wheels)  Cognition: Cognition Overall Cognitive Status: Impaired/Different from baseline Arousal/Alertness: Awake/alert Orientation Level: Oriented to person, Oriented to place, Disoriented to time, Disoriented to situation Month: October Day of Week: Incorrect Attention: Focused, Sustained Focused Attention: Impaired Focused Attention Impairment: Verbal basic, Verbal complex, Functional basic, Functional complex Sustained Attention: Impaired Sustained Attention Impairment: Verbal basic, Verbal complex, Functional basic, Functional complex Memory: Impaired Memory Impairment: Decreased recall of new information Awareness: Impaired Awareness Impairment: Intellectual impairment, Emergent impairment Problem Solving: Impaired Problem Solving Impairment: Functional basic, Verbal basic, Verbal complex, Functional complex Executive Function: Sequencing, Organizing, Self Monitoring Sequencing: Impaired Organizing: Impaired Self Monitoring: Impaired Safety/Judgment: Impaired Cognition Arousal: Alert Behavior During Therapy: Impulsive Overall Cognitive Status: Impaired/Different from baseline   Review of Systems  Unable to perform ROS: Language  Constitutional: Negative.   Skin: Negative.    Past Medical History:  Diagnosis Date   Bipolar 1 disorder (HCC)    Hypertension    Obesity    Past Surgical History:  Procedure Laterality Date   ABDOMINAL HYSTERECTOMY  ~2002   ANTERIOR CERVICAL DECOMP/DISCECTOMY FUSION N/A 02/24/2022    Procedure: ANTERIOR CERVICAL THREE- FOUR AND CERVICAL FOUR THROUGH FIVE DECOMPRESSION/DISCECTOMY FUSION 2 LEVELS;  Surgeon: Colon Shove, MD;  Location: [facility] OR;  Service: Neurosurgery;  Laterality: N/A;   Family History  Problem Relation Age of Onset   Kidney disease Mother    Hypertension Mother    Kidney disease Brother    Social History:  reports that she has never smoked. She has never used smokeless tobacco. She reports current alcohol use. She reports current drug use. Allergies:  Allergies  Allergen Reactions   Aspirin  Hives   Medications Prior to Admission  Medication Sig Dispense Refill   acetaminophen  (TYLENOL ) 325 MG tablet Take 2 tablets (650 mg total) by mouth every 4 (four) hours as needed for mild pain (or temp > 37.5 C (99.5 F)).     albuterol (VENTOLIN HFA) 108 (90 Base) MCG/ACT inhaler Inhale 1 puff into the lungs every 6 (six) hours as needed for shortness of breath.     bisacodyl  (DULCOLAX) 10 MG suppository Place 1 suppository (10 mg total) rectally daily as needed for moderate constipation. 12 suppository 0   diphenhydrAMINE (BENADRYL) 25 mg capsule Take 25 mg by mouth every 6 (six) hours as needed for itching.     docusate sodium  (COLACE) 100 MG capsule Take 1 capsule (100 mg total) by mouth 2 (two) times daily. 10 capsule 0   DULoxetine  (CYMBALTA ) 30 MG capsule Take 30 mg by mouth daily.     furosemide (LASIX) 20 MG tablet Take 20 mg by mouth daily as needed for fluid.     gabapentin  (NEURONTIN ) 100 MG capsule Take 100 mg by mouth 3 (three) times daily.     hydrALAZINE  (APRESOLINE ) 100 MG tablet Take 100 mg by mouth 2 (two) times daily.     HYDROcodone -acetaminophen  (NORCO/VICODIN) 5-325 MG tablet Take 1 tablet by mouth every 4 (four) hours as needed for moderate pain. 15 tablet 0   isosorbide -hydrALAZINE  (BIDIL ) 20-37.5 MG tablet Take 1 tablet by mouth 3 (three) times daily.  meloxicam (MOBIC) 15 MG tablet Take 15 mg by mouth daily.     omeprazole (PRILOSEC) 20  MG capsule Take 20 mg by mouth daily.     pravastatin  (PRAVACHOL ) 40 MG tablet Take 40 mg by mouth daily.     QUEtiapine  (SEROQUEL ) 50 MG tablet Take 50 mg by mouth at bedtime.     senna (SENOKOT) 8.6 MG TABS tablet Take 1 tablet (8.6 mg total) by mouth 2 (two) times daily. 120 tablet 0   tiZANidine (ZANAFLEX) 4 MG tablet Take 4 mg by mouth at bedtime as needed for muscle spasms.     traZODone  (DESYREL ) 100 MG tablet Take 100 mg by mouth at bedtime.     vitamin B-12 1000 MCG tablet Take 1 tablet (1,000 mcg total) by mouth daily.       Blood pressure (!) 161/74, pulse 91, temperature 98.7 F (37.1 C), temperature source Oral, resp. rate 16, weight 106 kg, SpO2 100%. Physical Exam Constitutional:      Appearance: She is obese.  HENT:     Head: Normocephalic.     Right Ear: External ear normal.     Left Ear: External ear normal.     Nose: Nose normal.     Mouth/Throat:     Mouth: Mucous membranes are moist.  Eyes:     Conjunctiva/sclera: Conjunctivae normal.  Cardiovascular:     Rate and Rhythm: Normal rate.  Pulmonary:     Effort: Pulmonary effort is normal.  Abdominal:     Palpations: Abdomen is soft.  Musculoskeletal:        General: Normal range of motion.     Cervical back: Normal range of motion.  Skin:    General: Skin is warm.  Neurological:     Mental Status: She is alert.     Comments: Pt is alert. Oriented to name, hospital, month, city of living. Follows basic commands with extra time. Word finding deficits with fair comprehension and apraxic at times. Significant dysarthria and right central VII. Mild right hemiparesis, 4 to 4+/5 prox to distal RUE and RLE and 4+ to 5/5 LUE and LLE. Sensed pain in all 4's. No abnl resting tone. DTR's 1+  Psychiatric:        Mood and Affect: Mood normal.        Behavior: Behavior normal.     Results for orders placed or performed during the hospital encounter of 07/27/24 (from the past 24 hours)  CBC     Status: Abnormal    Collection Time: 07/30/24  5:26 AM  Result Value Ref Range   WBC 5.4 4.0 - 10.5 K/uL   RBC 3.05 (L) 3.87 - 5.11 MIL/uL   Hemoglobin 7.2 (L) 12.0 - 15.0 g/dL   HCT 77.5 (L) 63.9 - 53.9 %   MCV 73.4 (L) 80.0 - 100.0 fL   MCH 23.6 (L) 26.0 - 34.0 pg   MCHC 32.1 30.0 - 36.0 g/dL   RDW 83.7 (H) 88.4 - 84.4 %   Platelets 174 150 - 400 K/uL   nRBC 0.0 0.0 - 0.2 %  Basic metabolic panel with GFR     Status: Abnormal   Collection Time: 07/30/24  5:26 AM  Result Value Ref Range   Sodium 142 135 - 145 mmol/L   Potassium 3.8 3.5 - 5.1 mmol/L   Chloride 111 98 - 111 mmol/L   CO2 23 22 - 32 mmol/L   Glucose, Bld 107 (H) 70 - 99 mg/dL   BUN 9  8 - 23 mg/dL   Creatinine, Ser 8.92 (H) 0.44 - 1.00 mg/dL   Calcium 89.3 (H) 8.9 - 10.3 mg/dL   GFR, Estimated 54 (L) >60 mL/min   Anion gap 8 5 - 15  Magnesium      Status: None   Collection Time: 07/30/24  5:26 AM  Result Value Ref Range   Magnesium  1.9 1.7 - 2.4 mg/dL   ECHOCARDIOGRAM COMPLETE Result Date: 07/29/2024    ECHOCARDIOGRAM REPORT   Patient Name:   ELIDA HARBIN Date of Exam: 07/29/2024 Medical Rec #:  [id number]       Height:       65.0 in Accession #:    [id number]      Weight:       233.7 lb Date of Birth:  1946/01/24       BSA:          2.113 m Patient Age:    78 years        BP:           107/46 mmHg Patient Gender: F               HR:           85 bpm. Exam Location:  Inpatient Procedure: 2D Echo and Intracardiac Opacification Agent (Both Spectral and Color            Flow Doppler were utilized during procedure). Indications:    Stroke  History:        Patient has no prior history of Echocardiogram examinations.  Sonographer:    Charmaine Gaskins Referring Phys: [id number] MOHAMMAD L GARBA IMPRESSIONS  1. 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 Left ventricular ejection fraction, by estimation, is >75%. The left ventricle has hyperdynamic function. The left  ventricle has no regional wall motion abnormalities. There is mild concentric left ventricular hypertrophy. Left ventricular diastolic parameters are consistent with Grade I diastolic dysfunction (impaired relaxation).  2. Right ventricular systolic function is normal. The right ventricular size is normal. There is mildly elevated pulmonary artery systolic pressure. The estimated right ventricular systolic pressure is 36.4 mmHg.  3. The mitral valve is normal in structure. Trivial mitral valve regurgitation. No evidence of mitral stenosis.  4. The aortic valve is normal in structure. Aortic valve regurgitation is not visualized. No aortic stenosis is present.  5. The inferior vena cava is normal in size with greater than 50% respiratory variability, suggesting right atrial pressure of 3 mmHg. Conclusion(s)/Recommendation(s): No intracardiac source of embolism detected on this transthoracic study. Consider a transesophageal echocardiogram to exclude cardiac source of embolism if clinically indicated. 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 Left ventricular ejection fraction, by estimation, is >75%. The left ventricle has hyperdynamic function.  The left ventricle has no regional wall motion abnormalities. The left ventricular internal cavity size was normal in size. There is mild concentric left ventricular hypertrophy. Left ventricular diastolic parameters are consistent with Grade I diastolic dysfunction (impaired relaxation). Right Ventricle: The right ventricular size is normal. No increase in right ventricular wall thickness. Right ventricular systolic function is normal. There is mildly elevated pulmonary artery systolic pressure. The tricuspid regurgitant velocity is 2.89  m/s, and with an assumed right atrial pressure of 3 mmHg, the estimated right ventricular systolic pressure is 36.4 mmHg.  Left Atrium: Left atrial size was normal in  size. Right Atrium: Right atrial size was normal in size. Pericardium: There is no evidence of pericardial effusion. Mitral Valve: The mitral valve is normal in structure. Trivial mitral valve regurgitation. No evidence of mitral valve stenosis. Tricuspid Valve: The tricuspid valve is normal in structure. Tricuspid valve regurgitation is trivial. No evidence of tricuspid stenosis. Aortic Valve: The aortic valve is normal in structure. Aortic valve regurgitation is not visualized. No aortic stenosis is present. Pulmonic Valve: The pulmonic valve was normal in structure. Pulmonic valve regurgitation is not visualized. No evidence of pulmonic stenosis. Aorta: The aortic root is normal in size and structure. Venous: The inferior vena cava is normal in size with greater than 50% respiratory variability, suggesting right atrial pressure of 3 mmHg. IAS/Shunts: No atrial level shunt detected by color flow Doppler.  LEFT VENTRICLE PLAX 2D LVIDd:         3.90 cm   Diastology LVIDs:         1.60 cm   LV e' medial:    0.05 cm/s LV PW:         1.20 cm   LV E/e' medial:  14.1 LV IVS:        1.30 cm   LV e' lateral:   0.07 cm/s LVOT diam:     2.10 cm   LV E/e' lateral: 10.5 LVOT Area:     3.46 cm  RIGHT VENTRICLE RV Basal diam:  2.90 cm RV Mid diam:    2.50 cm RV S prime:     15.60 cm/s LEFT ATRIUM             Index        RIGHT ATRIUM           Index LA diam:        2.70 cm 1.28 cm/m   RA Area:     12.40 cm LA Vol (A2C):   51.7 ml 24.46 ml/m  RA Volume:   26.80 ml  12.68 ml/m LA Vol (A4C):   40.9 ml 19.35 ml/m LA Biplane Vol: 46.3 ml 21.91 ml/m   AORTA Ao Root diam: 2.90 cm Ao Asc diam:  3.40 cm MITRAL VALVE                TRICUSPID VALVE MV Area (PHT): 7.59 cm     TR Peak grad:   33.4 mmHg MV Decel Time: 100 msec     TR Vmax:        289.00 cm/s MV E velocity: 0.72 cm/s MV A velocity: 101.00 cm/s  SHUNTS MV E/A ratio:  0.01         Systemic Diam: 2.10 cm Oneil Parchment MD  Electronically signed by Oneil Parchment MD Signature Date/Time: 07/29/2024/1:48:21 PM    Final     Assessment/Plan: Diagnosis: 77 year old female with left MCA infarct and associated right hemiparesis and aphasia Does the need for close, 24 hr/day medical supervision in concert with the patient's rehab needs make it unreasonable for this patient to be served in a less intensive setting? Yes Co-Morbidities requiring supervision/potential complications:  - Post stroke dysphagia -Hypertension -Microcytic anemia Due to bladder management, bowel management, safety, skin/wound care, disease management, medication administration, pain management, and patient education, does the patient require 24 hr/day rehab nursing? Yes Does the patient require coordinated care of a physician, rehab nurse, therapy disciplines of PT, OT, SLP to address physical and functional deficits in the context of the above medical diagnosis(es)? Yes Addressing deficits in the following areas:  balance, endurance, locomotion, strength, transferring, bowel/bladder control, bathing, dressing, feeding, grooming, toileting, speech, language, swallowing, and psychosocial support Can the patient actively participate in an intensive therapy program of at least 3 hrs of therapy per day at least 5 days per week? Yes The potential for patient to make measurable gains while on inpatient rehab is excellent Anticipated functional outcomes upon discharge from inpatient rehab are supervision  with PT, supervision with OT, supervision and min assist with SLP. Estimated rehab length of stay to reach the above functional goals is: 7-10 days Anticipated discharge destination: Home Overall Rehab/Functional Prognosis: excellent  POST ACUTE RECOMMENDATIONS: This patient's condition is appropriate for continued rehabilitative care in the following setting: CIR Patient has agreed to participate in recommended program. Yes Note that insurance prior  authorization may be required for reimbursement for recommended care.  Comment: Rehab Admissions Coordinator to follow up.      I have personally performed a face to face diagnostic evaluation of this patient. Additionally, I have examined the patient's medical record including any pertinent labs and radiographic images.    Thanks,  Arthea ONEIDA Gunther, MD 07/30/2024

## 2024-07-30 NOTE — Progress Notes (Signed)
 Inpatient Rehab Admissions Coordinator:   Talked to pt's daughter to update on pt progress with therapy yesterday.  I do feel that she would be able to reach a supervision ambulatory level with a 2-3 week CIR stay.  [name] confirms that between her and pt's s/o, [name], pt will have that level of support.  Will send to insurance today for prior auth request pending rehab MD formal consult   Reche Lowers, PT, DPT Admissions Coordinator [phone] 07/30/24  10:36 AM

## 2024-07-30 NOTE — Plan of Care (Signed)
  Problem: Clinical Measurements: Goal: Ability to maintain clinical measurements within normal limits will improve Outcome: Progressing   Problem: Elimination: Goal: Will not experience complications related to bowel motility Outcome: Progressing Goal: Will not experience complications related to urinary retention Outcome: Progressing   Problem: Pain Managment: Goal: General experience of comfort will improve and/or be controlled Outcome: Progressing   Problem: Education: Goal: Knowledge of disease or condition will improve Outcome: Progressing   Problem: Self-Care: Goal: Ability to participate in self-care as condition permits will improve Outcome: Progressing   Problem: Nutrition: Goal: Risk of aspiration will decrease Outcome: Progressing

## 2024-07-31 LAB — BASIC METABOLIC PANEL WITH GFR
Anion gap: 9 (ref 5–15)
BUN: 8 mg/dL (ref 8–23)
CO2: 21 mmol/L — ABNORMAL LOW (ref 22–32)
Calcium: 10.6 mg/dL — ABNORMAL HIGH (ref 8.9–10.3)
Chloride: 111 mmol/L (ref 98–111)
Creatinine, Ser: 1.04 mg/dL — ABNORMAL HIGH (ref 0.44–1.00)
GFR, Estimated: 56 mL/min — ABNORMAL LOW (ref >60)
Glucose, Bld: 105 mg/dL — ABNORMAL HIGH (ref 70–99)
Potassium: 3.6 mmol/L (ref 3.5–5.1)
Sodium: 141 mmol/L (ref 135–145)

## 2024-07-31 LAB — TYPE AND SCREEN
ABO/RH(D): A POS
Antibody Screen: NEGATIVE

## 2024-07-31 LAB — MAGNESIUM: Magnesium: 1.8 mg/dL (ref 1.7–2.4)

## 2024-07-31 LAB — FERRITIN: Ferritin: 7 ng/mL — ABNORMAL LOW (ref 11–307)

## 2024-07-31 LAB — CBC
HCT: 22.7 % — ABNORMAL LOW (ref 36.0–46.0)
Hemoglobin: 7.3 g/dL — ABNORMAL LOW (ref 12.0–15.0)
MCH: 23.6 pg — ABNORMAL LOW (ref 26.0–34.0)
MCHC: 32.2 g/dL (ref 30.0–36.0)
MCV: 73.5 fL — ABNORMAL LOW (ref 80.0–100.0)
Platelets: 167 K/uL (ref 150–400)
RBC: 3.09 MIL/uL — ABNORMAL LOW (ref 3.87–5.11)
RDW: 16.2 % — ABNORMAL HIGH (ref 11.5–15.5)
WBC: 6.3 K/uL (ref 4.0–10.5)
nRBC: 0 % (ref 0.0–0.2)

## 2024-07-31 LAB — IRON AND TIBC
Iron: 15 ug/dL — ABNORMAL LOW (ref 28–170)
Saturation Ratios: 4 % — ABNORMAL LOW (ref 10.4–31.8)
TIBC: 368 ug/dL (ref 250–450)
UIBC: 353 ug/dL

## 2024-07-31 MED ORDER — LOSARTAN POTASSIUM 25 MG PO TABS
25.0000 mg | ORAL_TABLET | Freq: Every day | ORAL | Status: DC
  Administered 2024-07-31 – 2024-08-01 (×2): 25 mg via ORAL
  Filled 2024-07-31 (×2): qty 1

## 2024-07-31 NOTE — Progress Notes (Addendum)
 PROGRESS NOTE    Christie Meza  FMW:[id number] DOB: 10/21/1946 DOA: 07/27/2024 PCP: Shelda Atlas, MD   Brief Narrative: Christie Meza is a 78 y.o. female with a history of hypertension, hyperlipidemia, bipolar disorder, chronic pain, alcohol abuse.  Patient presented secondary to right-sided facial droop and dysarthria concerning for a stroke. 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 No cardiac source identified. Patient to follow-up with neurology as an outpatient; plan for dual antiplatelet therapy for 3 months followed by Aspirin  monotherapy. Recommendation for discharge to acute inpatient rehabilitation.   Assessment and Plan:  Acute left MCA CVA Patient presented with symptoms including right-sided facial droop and dysarthria. Initial CT head (10/4) significant for a small age-indeterminate cortical infarct within the mid-to-posterior left frontal lobe. MRI brain (10/5) confirms acute non-hemorrhagic left MCA territory infarct involving the frontal, parietal, and temporal lobes with associated subcortical edema and cerebral swelling without midline shift/mass. 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 LDL of 73. Hemoglobin A1C of 5.3%. Transthoracic Echocardiogram significant for an LVEF of > 75% with no intracardiac source of embolism and no atrial level shunt. Neurology recommendations for Aspirin  and Plavix for 3 months followed by Aspirin  monotherapy and a 30-day heart monitor; patient unfortunately has an allergy to aspirin , so patient is on Plavix monotherapy. PT/OT recommendations for acute inpatient rehabilitation. Medication regimen on discharge includes Plavix 75 mg daily, Lipitor 40 mg daily. Recommendation for Neurology follow-up in 8 weeks.  Microcytic anemia Baseline hemoglobin of 8-10.  Hemoglobin of 8.1 g/dL on admission with downtrend to a  low of 7.2 g/dL. Currently stable. Anemia panel consistent with iron deficiency anemia. Last colonoscopy from 2009 significant for hyperplastic polyps, diverticulosis and hemorrhoids. -Will recommend iron supplementation on discharge  Hyperlipidemia Noted. Patient is on Pravastatin   Primary hypertension Patient is on Bidil  as an outpatient. Blood pressure currently uncontrolled. -Continue Bidil  -Add losartan  Bipolar disorder Mood disorder -Continue Seroquel  and Cymbalta   AKI Baseline creatinine of 1.2. Creatinine of 1.54 on admission with peak of 1.60. Resolved.  Obesity, class II Estimated body mass index is 38.89 kg/m as calculated from the following:   Height as of 02/24/22: 5' 5 (1.651 m).   Weight as of this encounter: 106 kg.   DVT prophylaxis: Lovenox  Code Status:   Code Status: Full Code Family Communication: None at bedside Disposition Plan: Discharge to acute inpatient rehabilitation pending bed availability   Consultants:  Neurology  Procedures:  Transthoracic Echocardiogram  Antimicrobials: None    Subjective: Patient reports no specific concerns.  Objective: BP (!) 146/68 (BP Location: Left Arm)   Pulse 95   Temp 97.7 F (36.5 C) (Oral)   Resp 16   Wt 106 kg   SpO2 97%   BMI 38.89 kg/m   Examination:  General exam: Appears calm and comfortable. Respiratory system: Clear to auscultation. Respiratory effort normal. Cardiovascular system: S1 & S2 heard, RRR. No murmur. Gastrointestinal system: Abdomen is nondistended, soft and nontender. Normal bowel sounds heard. Central nervous system: Alert and oriented. dysarthria Musculoskeletal: No edema. No calf tenderness Psychiatry: Judgement and insight appear normal. Mood & affect appropriate.    Data Reviewed: I have personally reviewed following labs and imaging studies  CBC Lab Results  Component Value Date   WBC 6.3 07/31/2024   RBC 3.09 (L) 07/31/2024   HGB 7.3 (L) 07/31/2024   HCT  22.7 (  L) 07/31/2024   MCV 73.5 (L) 07/31/2024   MCH 23.6 (L) 07/31/2024   PLT 167 07/31/2024   MCHC 32.2 07/31/2024   RDW 16.2 (H) 07/31/2024   LYMPHSABS 1.4 07/27/2024   MONOABS 0.5 07/27/2024   EOSABS 0.2 07/27/2024   BASOSABS 0.0 07/27/2024     Last metabolic panel Lab Results  Component Value Date   NA 141 07/31/2024   K 3.6 07/31/2024   CL 111 07/31/2024   CO2 21 (L) 07/31/2024   BUN 8 07/31/2024   CREATININE 1.04 (H) 07/31/2024   GLUCOSE 105 (H) 07/31/2024   GFRNONAA 56 (L) 07/31/2024   GFRAA 79 (L) 12/03/2014   CALCIUM 10.6 (H) 07/31/2024   PHOS 3.0 03/01/2022   PROT 6.7 07/27/2024   ALBUMIN 3.6 07/27/2024   BILITOT 0.7 07/27/2024   ALKPHOS 149 (H) 07/27/2024   AST 22 07/27/2024   ALT 11 07/27/2024   ANIONGAP 9 07/31/2024    GFR: CrCl cannot be calculated (Unknown ideal weight.).  No results found for this or any previous visit (from the past 240 hours).    Radiology Studies: No results found.    LOS: 4 days    Elgin Lam, MD [facility] 07/31/2024, 12:35 PM   If 7PM-7AM, please contact night-coverage [email or web address]

## 2024-07-31 NOTE — Hospital Course (Signed)
 Christie Meza is a 78 y.o. female with a history of hypertension, hyperlipidemia, bipolar disorder, chronic pain, alcohol abuse.  Patient presented secondary to right-sided facial droop and dysarthria concerning for a stroke. 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 No cardiac source identified. Patient to follow-up with neurology as an outpatient; plan for dual antiplatelet therapy for 3 months followed by Aspirin  monotherapy. Recommendation for discharge to acute inpatient rehabilitation.

## 2024-07-31 NOTE — Plan of Care (Signed)
   Problem: Clinical Measurements: Goal: Ability to maintain clinical measurements within normal limits will improve Outcome: Progressing Goal: Diagnostic test results will improve Outcome: Progressing   Problem: Activity: Goal: Risk for activity intolerance will decrease Outcome: Progressing   Problem: Nutrition: Goal: Adequate nutrition will be maintained Outcome: Progressing

## 2024-07-31 NOTE — Progress Notes (Signed)
 Physical Therapy Treatment Patient Details Name: Christie Meza MRN: [id number] DOB: 1945/12/24 Today's Date: 07/31/2024   History of Present Illness 78 y.o. female presents to [facility] 07/27/24 with R sided facial droop and slurred speech. 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 PMHx: HTN, bipolar disorder, obesity, chronic pain syndrome, alcohol abuse, urine incontinence   PT Comments  Pt supine in bed upon arrival and agreeable to PT session. Pt continues to be impulsive with decreased safety awareness and difficulty communicating. Increased difficulty ambulating with a RW as pt needed constant cues for RW management and to keep R hand on handle. Able to perform multiple sit<>stand repetitions for LE strengthening with MinA and UE support. Pt continues to be an excellent candidate for >3hrs post acute rehab with strong family support and motivation to improve. Acute PT to follow.    If plan is discharge home, recommend the following: A lot of help with walking and/or transfers;A lot of help with bathing/dressing/bathroom;Assistance with cooking/housework;Direct supervision/assist for medications management;Direct supervision/assist for financial management;Assist for transportation;Help with stairs or ramp for entrance   Can travel by private vehicle     Yes   Equipment Recommendations  Other (comment) (TBA)       Precautions / Restrictions Precautions Precautions: Fall;Other (comment) Recall of Precautions/Restrictions: Impaired Precaution/Restrictions Comments: R inattention Restrictions Weight Bearing Restrictions Per Provider Order: No     Mobility  Bed Mobility Overal bed mobility: Needs Assistance Bed Mobility: Supine to Sit    Supine to sit: Min assist    General bed mobility comments: MinA for slight trunk assist    Transfers Overall  transfer level: Needs assistance Equipment used: None, Rolling walker (2 wheels) Transfers: Sit to/from Stand, Bed to chair/wheelchair/BSC Sit to Stand: Min assist   Step pivot transfers: Min assist     General transfer comment: MinA for steadying assist with pt able to stand with no AD and with RW. Cues for hand placement on RW with difficulty keeping R hand on RW handle    Ambulation/Gait Ambulation/Gait assistance: Min assist Gait Distance (Feet): 8 Feet Assistive device: Rolling walker (2 wheels), None Gait Pattern/deviations: Step-through pattern, Decreased step length - right, Drifts right/left Gait velocity: decr    General Gait Details: Assist for RW management with pt trying to ambulate with no AD. Slightly unsteady with difficulty sequencing RW for gait   Modified Rankin (Stroke Patients Only) Modified Rankin (Stroke Patients Only) Pre-Morbid Rankin Score: Moderate disability Modified Rankin: Moderately severe disability     Balance Overall balance assessment: Needs assistance Sitting-balance support: No upper extremity supported, Feet supported Sitting balance-Leahy Scale: Fair     Standing balance support: Bilateral upper extremity supported, No upper extremity supported, During functional activity, Reliant on assistive device for balance Standing balance-Leahy Scale: Poor Standing balance comment: UE and external support       Communication Communication Communication: Impaired Factors Affecting Communication: Difficulty expressing self;Reduced clarity of speech  Cognition Arousal: Alert Behavior During Therapy: Impulsive   PT - Cognitive impairments: Awareness, Attention, Problem solving, Safety/Judgement, Sequencing    PT - Cognition Comments: Impulsive with decreased safety awareness. Difficulty following commands and sequencing tasks Following commands: Impaired Following commands impaired: Follows one step commands inconsistently, Follows one step  commands with increased time    Cueing Cueing Techniques: Verbal cues, Tactile cues  Exercises Other Exercises Other Exercises: x4 serial sit<>stand with MinA and no UE  support        Pertinent Vitals/Pain Pain Assessment Pain Assessment: No/denies pain     PT Goals (current goals can now be found in the care plan section) Acute Rehab PT Goals PT Goal Formulation: With patient Time For Goal Achievement: 08/12/24 Potential to Achieve Goals: Good Progress towards PT goals: Progressing toward goals    Frequency    Min 2X/week       AM-PAC PT 6 Clicks Mobility   Outcome Measure  Help needed turning from your back to your side while in a flat bed without using bedrails?: A Little Help needed moving from lying on your back to sitting on the side of a flat bed without using bedrails?: A Little Help needed moving to and from a bed to a chair (including a wheelchair)?: A Little Help needed standing up from a chair using your arms (e.g., wheelchair or bedside chair)?: A Little Help needed to walk in hospital room?: A Lot Help needed climbing 3-5 steps with a railing? : Total 6 Click Score: 15    End of Session Equipment Utilized During Treatment: Gait belt Activity Tolerance: Patient tolerated treatment well Patient left: in chair;with call bell/phone within reach;with chair alarm set;with family/visitor present Nurse Communication: Mobility status PT Visit Diagnosis: Unsteadiness on feet (R26.81);Other abnormalities of gait and mobility (R26.89);Muscle weakness (generalized) (M62.81)     Time: 8555-8494 PT Time Calculation (min) (ACUTE ONLY): 21 min  Charges:    $Gait Training: 8-22 mins PT General Charges $$ ACUTE PT VISIT: 1 Visit                    Kate ORN, PT, DPT Secure Chat Preferred  Rehab Office [phone]   Kate BRAVO Wendolyn 07/31/2024, 3:19 PM

## 2024-07-31 NOTE — Progress Notes (Signed)
 Inpatient Rehab Admissions Coordinator:   I received insurance approval for CIR admit.  I have no beds available for this patient to admit to CIR today.  Will continue to follow for timing of potential admission pending bed availability.   Reche Lowers, PT, DPT Admissions Coordinator [phone] 07/31/24  9:18 AM

## 2024-07-31 NOTE — Plan of Care (Signed)
  Problem: Clinical Measurements: Goal: Ability to maintain clinical measurements within normal limits will improve Outcome: Progressing   Problem: Elimination: Goal: Will not experience complications related to bowel motility Outcome: Progressing Goal: Will not experience complications related to urinary retention Outcome: Progressing   Problem: Pain Managment: Goal: General experience of comfort will improve and/or be controlled Outcome: Progressing   Problem: Safety: Goal: Ability to remain free from injury will improve Outcome: Progressing   Problem: Skin Integrity: Goal: Risk for impaired skin integrity will decrease Outcome: Progressing   Problem: Education: Goal: Knowledge of disease or condition will improve Outcome: Progressing   Problem: Nutrition: Goal: Risk of aspiration will decrease Outcome: Progressing

## 2024-07-31 NOTE — Plan of Care (Signed)
  Problem: Clinical Measurements: Goal: Ability to maintain clinical measurements within normal limits will improve Outcome: Progressing   Problem: Elimination: Goal: Will not experience complications related to bowel motility Outcome: Progressing Goal: Will not experience complications related to urinary retention Outcome: Progressing   Problem: Elimination: Goal: Will not experience complications related to urinary retention Outcome: Progressing   Problem: Elimination: Goal: Will not experience complications related to urinary retention Outcome: Progressing   Problem: Pain Managment: Goal: General experience of comfort will improve and/or be controlled Outcome: Progressing   Problem: Skin Integrity: Goal: Risk for impaired skin integrity will decrease Outcome: Progressing   Problem: Education: Goal: Knowledge of disease or condition will improve Outcome: Progressing   Problem: Self-Care: Goal: Ability to participate in self-care as condition permits will improve Outcome: Progressing   Problem: Nutrition: Goal: Risk of aspiration will decrease Outcome: Progressing

## 2024-08-01 ENCOUNTER — Inpatient Hospital Stay (HOSPITAL_COMMUNITY)
Admission: AD | Admit: 2024-08-01 | Discharge: 2024-08-10 | DRG: 057 | Disposition: A | Discharge status: Home Health | Source: Intra-hospital | Attending: Physical Medicine and Rehabilitation | Admitting: Physical Medicine and Rehabilitation

## 2024-08-01 ENCOUNTER — Other Ambulatory Visit: Payer: Self-pay

## 2024-08-01 ENCOUNTER — Encounter (HOSPITAL_COMMUNITY): Payer: Self-pay | Admitting: Physical Medicine and Rehabilitation

## 2024-08-01 DIAGNOSIS — I69391 Dysphagia following cerebral infarction: Secondary | ICD-10-CM | POA: Diagnosis not present

## 2024-08-01 DIAGNOSIS — R32 Unspecified urinary incontinence: Secondary | ICD-10-CM | POA: Diagnosis present

## 2024-08-01 DIAGNOSIS — Z886 Allergy status to analgesic agent status: Secondary | ICD-10-CM

## 2024-08-01 DIAGNOSIS — N179 Acute kidney failure, unspecified: Secondary | ICD-10-CM | POA: Diagnosis present

## 2024-08-01 DIAGNOSIS — R531 Weakness: Secondary | ICD-10-CM | POA: Diagnosis present

## 2024-08-01 DIAGNOSIS — F319 Bipolar disorder, unspecified: Secondary | ICD-10-CM | POA: Diagnosis present

## 2024-08-01 DIAGNOSIS — Z8419 Family history of other disorders of kidney and ureter: Secondary | ICD-10-CM

## 2024-08-01 DIAGNOSIS — Z8249 Family history of ischemic heart disease and other diseases of the circulatory system: Secondary | ICD-10-CM | POA: Diagnosis not present

## 2024-08-01 DIAGNOSIS — G894 Chronic pain syndrome: Secondary | ICD-10-CM | POA: Diagnosis present

## 2024-08-01 DIAGNOSIS — D509 Iron deficiency anemia, unspecified: Secondary | ICD-10-CM | POA: Diagnosis present

## 2024-08-01 DIAGNOSIS — K219 Gastro-esophageal reflux disease without esophagitis: Secondary | ICD-10-CM | POA: Diagnosis not present

## 2024-08-01 DIAGNOSIS — K5900 Constipation, unspecified: Secondary | ICD-10-CM | POA: Diagnosis present

## 2024-08-01 DIAGNOSIS — Z6838 Body mass index (BMI) 38.0-38.9, adult: Secondary | ICD-10-CM

## 2024-08-01 DIAGNOSIS — Z741 Need for assistance with personal care: Secondary | ICD-10-CM | POA: Diagnosis present

## 2024-08-01 DIAGNOSIS — Z90710 Acquired absence of both cervix and uterus: Secondary | ICD-10-CM

## 2024-08-01 DIAGNOSIS — E66812 Obesity, class 2: Secondary | ICD-10-CM | POA: Diagnosis present

## 2024-08-01 DIAGNOSIS — R1310 Dysphagia, unspecified: Secondary | ICD-10-CM | POA: Diagnosis present

## 2024-08-01 DIAGNOSIS — I69392 Facial weakness following cerebral infarction: Secondary | ICD-10-CM | POA: Diagnosis not present

## 2024-08-01 DIAGNOSIS — I63512 Cerebral infarction due to unspecified occlusion or stenosis of left middle cerebral artery: Secondary | ICD-10-CM | POA: Diagnosis not present

## 2024-08-01 DIAGNOSIS — Z791 Long term (current) use of non-steroidal anti-inflammatories (NSAID): Secondary | ICD-10-CM | POA: Diagnosis not present

## 2024-08-01 DIAGNOSIS — I10 Essential (primary) hypertension: Secondary | ICD-10-CM | POA: Diagnosis present

## 2024-08-01 DIAGNOSIS — Z79899 Other long term (current) drug therapy: Secondary | ICD-10-CM | POA: Diagnosis not present

## 2024-08-01 DIAGNOSIS — K5901 Slow transit constipation: Secondary | ICD-10-CM | POA: Diagnosis not present

## 2024-08-01 DIAGNOSIS — E785 Hyperlipidemia, unspecified: Secondary | ICD-10-CM | POA: Diagnosis present

## 2024-08-01 MED ORDER — ENOXAPARIN SODIUM 40 MG/0.4ML IJ SOSY
40.0000 mg | PREFILLED_SYRINGE | Freq: Every day | INTRAMUSCULAR | Status: DC
  Administered 2024-08-02 – 2024-08-08 (×7): 40 mg via SUBCUTANEOUS
  Filled 2024-08-01 (×8): qty 0.4

## 2024-08-01 MED ORDER — DULOXETINE HCL 30 MG PO CPEP
30.0000 mg | ORAL_CAPSULE | Freq: Every day | ORAL | Status: AC
Start: 2024-08-02 — End: ?
  Administered 2024-08-02 – 2024-08-10 (×9): 30 mg via ORAL
  Filled 2024-08-01 (×9): qty 1

## 2024-08-01 MED ORDER — CLOPIDOGREL BISULFATE 75 MG PO TABS
75.0000 mg | ORAL_TABLET | Freq: Every day | ORAL | Status: DC
  Administered 2024-08-02 – 2024-08-10 (×9): 75 mg via ORAL
  Filled 2024-08-01 (×9): qty 1

## 2024-08-01 MED ORDER — SENNOSIDES-DOCUSATE SODIUM 8.6-50 MG PO TABS: 1.0000 | ORAL_TABLET | Freq: Every evening | ORAL | Status: DC | PRN

## 2024-08-01 MED ORDER — ISOSORB DINITRATE-HYDRALAZINE 20-37.5 MG PO TABS
1.0000 | ORAL_TABLET | Freq: Three times a day (TID) | ORAL | Status: AC
Start: 2024-08-01 — End: ?
  Administered 2024-08-01 – 2024-08-10 (×26): 1 via ORAL
  Filled 2024-08-01 (×28): qty 1

## 2024-08-01 MED ORDER — CLOPIDOGREL BISULFATE 75 MG PO TABS: 75.0000 mg | ORAL_TABLET | Freq: Every day | ORAL | Status: DC

## 2024-08-01 MED ORDER — ACETAMINOPHEN 160 MG/5ML PO SOLN: 650.0000 mg | ORAL | Status: DC | PRN

## 2024-08-01 MED ORDER — ATORVASTATIN CALCIUM 40 MG PO TABS: 40.0000 mg | ORAL_TABLET | Freq: Every day | ORAL | Status: DC

## 2024-08-01 MED ORDER — ACETAMINOPHEN 650 MG RE SUPP: 650.0000 mg | RECTAL | Status: DC | PRN

## 2024-08-01 MED ORDER — LOSARTAN POTASSIUM 25 MG PO TABS
25.0000 mg | ORAL_TABLET | Freq: Every day | ORAL | Status: DC
  Administered 2024-08-02 – 2024-08-10 (×9): 25 mg via ORAL
  Filled 2024-08-01 (×9): qty 1

## 2024-08-01 MED ORDER — ENOXAPARIN SODIUM 40 MG/0.4ML IJ SOSY: 40.0000 mg | PREFILLED_SYRINGE | INTRAMUSCULAR | Status: DC

## 2024-08-01 MED ORDER — LOSARTAN POTASSIUM 25 MG PO TABS: 25.0000 mg | ORAL_TABLET | Freq: Every day | ORAL | Status: DC

## 2024-08-01 MED ORDER — FERROUS GLUCONATE 324 (38 FE) MG PO TABS: 324.0000 mg | ORAL_TABLET | Freq: Every day | ORAL | Status: DC

## 2024-08-01 MED ORDER — ACETAMINOPHEN 325 MG PO TABS: 650.0000 mg | ORAL_TABLET | ORAL | Status: DC | PRN

## 2024-08-01 MED ORDER — QUETIAPINE FUMARATE 50 MG PO TABS
50.0000 mg | ORAL_TABLET | Freq: Every day | ORAL | Status: AC
Start: 2024-08-01 — End: ?
  Administered 2024-08-01 – 2024-08-09 (×9): 50 mg via ORAL
  Filled 2024-08-01 (×10): qty 1

## 2024-08-01 MED ORDER — FERROUS SULFATE 325 (65 FE) MG PO TABS
325.0000 mg | ORAL_TABLET | Freq: Every day | ORAL | Status: DC
  Administered 2024-08-02 – 2024-08-10 (×9): 325 mg via ORAL
  Filled 2024-08-01 (×9): qty 1

## 2024-08-01 MED ORDER — ATORVASTATIN CALCIUM 40 MG PO TABS
40.0000 mg | ORAL_TABLET | Freq: Every day | ORAL | Status: DC
  Administered 2024-08-02 – 2024-08-10 (×9): 40 mg via ORAL
  Filled 2024-08-01 (×9): qty 1

## 2024-08-01 NOTE — Plan of Care (Signed)
  Problem: Clinical Measurements: Goal: Respiratory complications will improve Outcome: Progressing Goal: Cardiovascular complication will be avoided Outcome: Progressing   Problem: Coping: Goal: Level of anxiety will decrease Outcome: Progressing   

## 2024-08-01 NOTE — Progress Notes (Signed)
 PROGRESS NOTE    Christie Meza  FMW:[id number] DOB: 10/13/1946 DOA: 07/27/2024 PCP: Shelda Atlas, MD   Brief Narrative: Christie Meza is a 78 y.o. female with a history of hypertension, hyperlipidemia, bipolar disorder, chronic pain, alcohol abuse.  Patient presented secondary to right-sided facial droop and dysarthria concerning for a stroke. 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 No cardiac source identified. Patient to follow-up with neurology as an outpatient; plan for dual antiplatelet therapy for 3 months followed by Aspirin  monotherapy. Recommendation for discharge to acute inpatient rehabilitation.   Assessment and Plan:  Acute left MCA CVA Patient presented with symptoms including right-sided facial droop and dysarthria. Initial CT head (10/4) significant for a small age-indeterminate cortical infarct within the mid-to-posterior left frontal lobe. MRI brain (10/5) confirms acute non-hemorrhagic left MCA territory infarct involving the frontal, parietal, and temporal lobes with associated subcortical edema and cerebral swelling without midline shift/mass. 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 LDL of 73. Hemoglobin A1C of 5.3%. Transthoracic Echocardiogram significant for an LVEF of > 75% with no intracardiac source of embolism and no atrial level shunt. Neurology recommendations for Aspirin  and Plavix for 3 months followed by Aspirin  monotherapy and a 30-day heart monitor; patient unfortunately has an allergy to aspirin , so patient is on Plavix monotherapy. PT/OT recommendations for acute inpatient rehabilitation. Medication regimen on discharge includes Plavix 75 mg daily, Lipitor 40 mg daily. Recommendation for Neurology follow-up in 8 weeks.  Microcytic anemia Baseline hemoglobin of 8-10.  Hemoglobin of 8.1 g/dL on admission with downtrend to a  low of 7.2 g/dL. Currently stable. Anemia panel consistent with iron deficiency anemia. Last colonoscopy from 2009 significant for hyperplastic polyps, diverticulosis and hemorrhoids. -Will recommend iron supplementation on discharge  Hyperlipidemia Noted. Patient is on Pravastatin   Primary hypertension Patient is on Bidil  as an outpatient. Blood pressure currently uncontrolled. -Continue Bidil  -Continue losartan  Bipolar disorder Mood disorder -Continue Seroquel  and Cymbalta   AKI Baseline creatinine of 1.2. Creatinine of 1.54 on admission with peak of 1.60. Resolved.  Obesity, class II Estimated body mass index is 38.89 kg/m as calculated from the following:   Height as of 02/24/22: 5' 5 (1.651 m).   Weight as of this encounter: 106 kg.   DVT prophylaxis: Lovenox  Code Status:   Code Status: Full Code Family Communication: None at bedside Disposition Plan: Discharge to acute inpatient rehabilitation pending bed availability   Consultants:  Neurology  Procedures:  Transthoracic Echocardiogram  Antimicrobials: None    Subjective: No issues this morning, per patient. Eating breakfast.  Objective: BP (!) 178/61 (BP Location: Left Arm)   Pulse 81   Temp 98.4 F (36.9 C) (Oral)   Resp 16   Wt 106 kg   SpO2 98%   BMI 38.89 kg/m   Examination:  General exam: Appears calm and comfortable. Respiratory system: Clear to auscultation. Respiratory effort normal. Cardiovascular system: S1 & S2 heard, RRR. No murmur. Gastrointestinal system: Abdomen is nondistended, soft and nontender. Normal bowel sounds heard. Central nervous system: Alert and oriented. Dysarthria. Psychiatry: Judgement and insight appear normal. Mood & affect appropriate.    Data Reviewed: I have personally reviewed following labs and imaging studies  CBC Lab Results  Component Value Date   WBC 6.3 07/31/2024   RBC 3.09 (L) 07/31/2024   HGB 7.3 (L) 07/31/2024   HCT 22.7 (L) 07/31/2024  MCV  73.5 (L) 07/31/2024   MCH 23.6 (L) 07/31/2024   PLT 167 07/31/2024   MCHC 32.2 07/31/2024   RDW 16.2 (H) 07/31/2024   LYMPHSABS 1.4 07/27/2024   MONOABS 0.5 07/27/2024   EOSABS 0.2 07/27/2024   BASOSABS 0.0 07/27/2024     Last metabolic panel Lab Results  Component Value Date   NA 141 07/31/2024   K 3.6 07/31/2024   CL 111 07/31/2024   CO2 21 (L) 07/31/2024   BUN 8 07/31/2024   CREATININE 1.04 (H) 07/31/2024   GLUCOSE 105 (H) 07/31/2024   GFRNONAA 56 (L) 07/31/2024   GFRAA 79 (L) 12/03/2014   CALCIUM 10.6 (H) 07/31/2024   PHOS 3.0 03/01/2022   PROT 6.7 07/27/2024   ALBUMIN 3.6 07/27/2024   BILITOT 0.7 07/27/2024   ALKPHOS 149 (H) 07/27/2024   AST 22 07/27/2024   ALT 11 07/27/2024   ANIONGAP 9 07/31/2024    GFR: CrCl cannot be calculated (Unknown ideal weight.).  No results found for this or any previous visit (from the past 240 hours).    Radiology Studies: No results found.    LOS: 5 days    Elgin Lam, MD [facility] 08/01/2024, 9:43 AM   If 7PM-7AM, please contact night-coverage [email or web address]

## 2024-08-01 NOTE — H&P (Signed)
 Physical Medicine and Rehabilitation Admission H&P        Chief Complaint  Patient presents with   Code Stroke  : HPI: Christie Meza. 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  Per chart review patient lives with significant other.  1 level home.  Modified independent with straight point cane.  Plan is for daughter and significant other to provide assistance on discharge.  Presented 07/27/2024 with acute onset of aphasia with right facial droop and right side weakness.  Blood pressure 180/96.  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  No significant mass effect or midline shift.  Patient did not receive TNK.  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  EEG was done demonstrating continuous slow activity in the left frontal temporal parietal region without seizure.  Echocardiogram with ejection fraction 75% grade 1 diastolic dysfunction no regional wall motion abnormalities.  Admission chemistries unremarkable except hemoglobin 8.1, creatinine 1.54.  Neurology follow-up recommendations to be maintained on Plavix 75 mg daily for CVA prophylaxis as aspirin  was not initiated due to allergy.  Plan for 30-day cardiac event monitor.  Lovenox  added for DVT prophylaxis.  Hospital course mild AKI baseline creatinine 1.2 peaking at 1.60 resolved with gentle IV fluids with latest creatinine 1.04.  Microcytic anemia baseline hemoglobin 8-10 dipping to 7.2 during hospital course.  Anemia panel consistent  with iron deficiency anemia.  Last colonoscopy from 2009 significant for hyperplastic polyps diverticulosis and hemorrhoids and recommendations were made for iron supplementation on discharge..  Currently on dysphagia #1 thin liquid diet.  Therapy evaluations completed due to patient decreased functional mobility and right sided weakness was admitted for a comprehensive rehab program.   Review of Systems  Constitutional:  Negative for chills and fever.  HENT:  Negative for hearing loss.   Eyes:  Negative for blurred vision and double vision.  Respiratory:  Negative for cough and shortness of breath.   Cardiovascular:  Negative for chest pain and palpitations.  Musculoskeletal:  Positive for joint pain and myalgias.  Skin:  Negative for rash.  Neurological:  Positive for speech change and focal weakness.  All other systems reviewed and are negative.      Past Medical History:  Diagnosis Date   Bipolar 1 disorder (HCC)     Hypertension     Obesity               Past Surgical History:  Procedure Laterality Date   ABDOMINAL HYSTERECTOMY   ~2002   ANTERIOR CERVICAL DECOMP/DISCECTOMY FUSION N/A 02/24/2022    Procedure: ANTERIOR CERVICAL THREE- FOUR AND CERVICAL FOUR THROUGH FIVE DECOMPRESSION/DISCECTOMY FUSION 2 LEVELS;  Surgeon: Colon Shove, MD;  Location: [facility] OR;  Service: Neurosurgery;  Laterality: N/A;             Family History  Problem Relation Age of Onset   Kidney disease Mother     Hypertension Mother     Kidney disease Brother          Social History:  reports that she has never smoked. She has never used smokeless tobacco.  She reports current alcohol use. She reports current drug use. Allergies:  Allergies      Allergies  Allergen Reactions   Aspirin  Hives            Medications Prior to Admission  Medication Sig Dispense Refill   acetaminophen  (TYLENOL ) 325 MG tablet Take 2 tablets (650 mg total) by mouth every 4 (four) hours as needed for mild pain (or temp >  37.5 C (99.5 F)).       albuterol (VENTOLIN HFA) 108 (90 Base) MCG/ACT inhaler Inhale 1 puff into the lungs every 6 (six) hours as needed for shortness of breath.       bisacodyl  (DULCOLAX) 10 MG suppository Place 1 suppository (10 mg total) rectally daily as needed for moderate constipation. 12 suppository 0   diphenhydrAMINE (BENADRYL) 25 mg capsule Take 25 mg by mouth every 6 (six) hours as needed for itching.       docusate sodium  (COLACE) 100 MG capsule Take 1 capsule (100 mg total) by mouth 2 (two) times daily. 10 capsule 0   DULoxetine  (CYMBALTA ) 30 MG capsule Take 30 mg by mouth daily.       furosemide (LASIX) 20 MG tablet Take 20 mg by mouth daily as needed for fluid.       gabapentin  (NEURONTIN ) 100 MG capsule Take 100 mg by mouth 3 (three) times daily.       hydrALAZINE  (APRESOLINE ) 100 MG tablet Take 100 mg by mouth 2 (two) times daily.       HYDROcodone -acetaminophen  (NORCO/VICODIN) 5-325 MG tablet Take 1 tablet by mouth every 4 (four) hours as needed for moderate pain. 15 tablet 0   isosorbide -hydrALAZINE  (BIDIL ) 20-37.5 MG tablet Take 1 tablet by mouth 3 (three) times daily.       meloxicam (MOBIC) 15 MG tablet Take 15 mg by mouth daily.       omeprazole (PRILOSEC) 20 MG capsule Take 20 mg by mouth daily.       pravastatin  (PRAVACHOL ) 40 MG tablet Take 40 mg by mouth daily.       QUEtiapine  (SEROQUEL ) 50 MG tablet Take 50 mg by mouth at bedtime.       senna (SENOKOT) 8.6 MG TABS tablet Take 1 tablet (8.6 mg total) by mouth 2 (two) times daily. 120 tablet 0   tiZANidine (ZANAFLEX) 4 MG tablet Take 4 mg by mouth at bedtime as needed for muscle spasms.       traZODone  (DESYREL ) 100 MG tablet Take 100 mg by mouth at bedtime.       vitamin B-12 1000 MCG tablet Take 1 tablet (1,000 mcg total) by mouth daily.                  Home: Home Living Family/patient expects to be discharged to:: Private residence Living Arrangements: Spouse/significant other Available Help at Discharge:  Family Type of Home: House Home Access: Level entry Home Layout: One level Bathroom Shower/Tub: Proofreader: Information systems manager, Medical laboratory scientific officer - single point Additional Comments: Unclear if accurate home set-up  Lives With: Other (Comment) (pt states spouse but unsure if reliable hx and novel aphasia (past notes state grandson assisted in 2023))   Functional History: Prior Function Prior Level of Function : Independent/Modified Independent, Patient poor historian/Family not available Mobility Comments: Pt reports being ModI with SP cane, unclear if accurate PLOF. Chart review from 2023 stated pt had assist for transfers ADLs Comments: Pt reports being Ind with ADLs and iADLs. Unclear if accurate PLOF.  Chart review in 2023 stated pt had assist for ADLs. Does not drive   Functional Status:  Mobility: Bed Mobility Overal bed mobility: Needs Assistance Bed Mobility: Supine to Sit Supine to sit: Min assist Sit to supine: Min assist General bed mobility comments: MinA for slight trunk assist Transfers Overall transfer level: Needs assistance Equipment used: None, Rolling walker (2 wheels) Transfers: Sit to/from Stand, Bed to chair/wheelchair/BSC Sit to Stand: Min assist Bed to/from chair/wheelchair/BSC transfer type:: Step pivot Step pivot transfers: Min assist General transfer comment: MinA for steadying assist with pt able to stand with no AD and with RW. Cues for hand placement on RW with difficulty keeping R hand on RW handle Ambulation/Gait Ambulation/Gait assistance: Min assist Gait Distance (Feet): 8 Feet Assistive device: Rolling walker (2 wheels), None Gait Pattern/deviations: Step-through pattern, Decreased step length - right, Drifts right/left General Gait Details: Assist for RW management with pt trying to ambulate with no AD. Slightly unsteady with difficulty sequencing RW for gait Gait velocity: decr Gait velocity interpretation: <1.8 ft/sec, indicate of risk for  recurrent falls   ADL: ADL Overall ADL's : Needs assistance/impaired Eating/Feeding: Sitting, Minimal assistance Grooming: Minimal assistance, Sitting Upper Body Bathing: Moderate assistance, Sitting Lower Body Bathing: Maximal assistance, Sitting/lateral leans Upper Body Dressing : Moderate assistance, Sitting Lower Body Dressing: Maximal assistance, Sitting/lateral leans Toilet Transfer: Moderate assistance, Ambulation, Rolling walker (2 wheels) Toilet Transfer Details (indicate cue type and reason): simulated around bed Toileting- Clothing Manipulation and Hygiene: Maximal assistance Functional mobility during ADLs: Moderate assistance, Rolling walker (2 wheels)   Cognition: Cognition Overall Cognitive Status: Impaired/Different from baseline Arousal/Alertness: Awake/alert Orientation Level: Oriented X4 Month: October Day of Week: Incorrect Attention: Focused, Sustained Focused Attention: Impaired Focused Attention Impairment: Verbal basic, Verbal complex, Functional basic, Functional complex Sustained Attention: Impaired Sustained Attention Impairment: Verbal basic, Verbal complex, Functional basic, Functional complex Memory: Impaired Memory Impairment: Decreased recall of new information Awareness: Impaired Awareness Impairment: Intellectual impairment, Emergent impairment Problem Solving: Impaired Problem Solving Impairment: Functional basic, Verbal basic, Verbal complex, Functional complex Executive Function: Sequencing, Organizing, Self Monitoring Sequencing: Impaired Organizing: Impaired Self Monitoring: Impaired Safety/Judgment: Impaired Cognition Arousal: Alert Behavior During Therapy: Impulsive Overall Cognitive Status: Impaired/Different from baseline   Physical Exam: Blood pressure (!) 178/61, pulse 81, temperature 98.4 F (36.9 C), temperature source Oral, resp. rate 16, weight 106 kg, SpO2 98%. Constitutional: No apparent distress. Appropriate appearance  for age.  Laying in bed. HENT: No JVD. Neck Supple. Trachea midline. Atraumatic, normocephalic. +R eye subconjunctival hematoma Eyes: PERRLA. EOMI. Visual fields grossly intact.  Cardiovascular: RRR, no murmurs/rub/gallops.  1+ bilateral peripheral edema. Peripheral pulses 2+  Respiratory: CTAB. No rales, rhonchi, or wheezing. On RA.  Abdomen: Nondistended.  Hypoactive bowel sounds. No distention or tenderness on palpation in all 4 quadrants.  GU: Not examined.  PureWick, draining clear urine.  Skin: C/D/I. No apparent lesions.  Peripheral IVs intact   MSK:      No apparent deformity.       Neurologic exam:  Cognition: AAO to person, place, time and event.  Language: Moderate to severe dysarthria.  Difficulty naming 1/3 objects.  Memory: Recalls 0/3 objects at 5 minutes.   Insight: Poor insight into current condition.  Mood: Pleasant affect, appropriate mood.  Sensation: To light touch intact in BL UEs and LEs  Reflexes: 2+ in BL UE and LEs. Negative Hoffman's and babinski signs bilaterally.  CN: Right facial droop, right tongue deviation, right V3 sensory deficit Coordination: Ataxia right upper and lower  extremity Spasticity: MAS 0 in all extremities.       Strength:  + Right upper extremity motor apraxia                RUE: 4/5 SA, 4/5 EF, 4/5 EE, 5/5 WE, 5/5 FF, 5/5 FA                LUE:  5/5 SA, 5/5 EF, 5/5 EE, 5/5 WE, 5/5 FF, 5/5 FA                RLE: 4/5 HF, 5/5 KE, 5/5  DF, 5/5  EHL, 5/5  PF                 LLE:  4-/5 HF, 4/5 KE, 4/5  DF, 4/5  EHL, 4/5  PF        Lab Results Last 48 Hours        Results for orders placed or performed during the hospital encounter of 07/27/24 (from the past 48 hours)  Type and screen [facility]     Status: None    Collection Time: 07/31/24  1:32 AM  Result Value Ref Range    ABO/RH(D) A POS      Antibody Screen NEG      Sample Expiration          08/03/2024,2359 Performed at [facility], [address] [place], [place] [address]    CBC     Status: Abnormal    Collection Time: 07/31/24  1:34 AM  Result Value Ref Range    WBC 6.3 4.0 - 10.5 K/uL    RBC 3.09 (L) 3.87 - 5.11 MIL/uL    Hemoglobin 7.3 (L) 12.0 - 15.0 g/dL      Comment: Reticulocyte Hemoglobin testing may be clinically indicated, consider ordering this additional test [id number]      HCT 22.7 (L) 36.0 - 46.0 %    MCV 73.5 (L) 80.0 - 100.0 fL    MCH 23.6 (L) 26.0 - 34.0 pg    MCHC 32.2 30.0 - 36.0 g/dL    RDW 83.7 (H) 88.4 - 15.5 %    Platelets 167 150 - 400 K/uL    nRBC 0.0 0.0 - 0.2 %      Comment: Performed at [facility], [address], [place], [place] [address]  Basic metabolic panel with GFR     Status: Abnormal    Collection Time: 07/31/24  1:34 AM  Result Value Ref Range    Sodium 141 135 - 145 mmol/L    Potassium 3.6 3.5 - 5.1 mmol/L    Chloride 111 98 - 111 mmol/L    CO2 21 (L) 22 - 32 mmol/L    Glucose, Bld 105 (H) 70 - 99 mg/dL      Comment: Glucose reference range applies only to samples taken after fasting for at least 8 hours.    BUN 8 8 - 23 mg/dL    Creatinine, Ser 8.95 (H) 0.44 - 1.00 mg/dL    Calcium 89.3 (H) 8.9 - 10.3 mg/dL    GFR, Estimated 56 (L) >60 mL/min      Comment: (NOTE) Calculated using the CKD-EPI Creatinine Equation (2021)      Anion gap 9 5 - 15      Comment: Performed at [facility], [address], [place], [place] [address]  Magnesium      Status: None    Collection Time: 07/31/24  1:34 AM  Result Value Ref Range    Magnesium  1.8 1.7 - 2.4 mg/dL      Comment: Performed at [facility], [address] [place], [place] [address]  Iron and TIBC     Status: Abnormal    Collection Time: 07/31/24  1:34 AM  Result Value Ref Range    Iron 15 (L) 28 - 170 ug/dL    TIBC 631 749 - 549 ug/dL    Saturation Ratios 4 (L) 10.4 - 31.8 %    UIBC 353 ug/dL      Comment: Performed at [facility], [address], [place], [place] [address]  Ferritin     Status:  Abnormal    Collection Time: 07/31/24  1:34 AM  Result Value Ref Range    Ferritin 7 (L) 11 - 307 ng/mL      Comment: Performed at [facility], [address], [place], [place] [address]      Imaging Results (Last 48 hours)  No results found.         Blood pressure (!) 178/61, pulse 81, temperature 98.4 F (36.9 C), temperature source Oral, resp. rate 16, weight 106 kg, SpO2 98%.   Medical Problem List and Plan: 1. 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  Plan 30-day cardiac event monitor             -patient may shower             -ELOS/Goals: 7-10 days, SPV PT/OT/Min A SLP              - Stable for IRF admission   2.  Antithrombotics: -DVT/anticoagulation:  Pharmaceutical: Lovenox              -antiplatelet therapy: Plavix alone 75 mg daily due to aspirin  allergy 3. Pain Management chronic pain: Currently on Tylenol  as needed.  Prior to admission patient was taking hydrocodone  5-325 mg 1 tablet every 4 hours as needed as well as Robaxin  every 6 hours as needed muscle spasms.  Resume as needed 4. Mood/Behavior/Sleep/bipolar disorder: Cymbalta  30 mg daily             -antipsychotic agents: Seroquel  50 mg nightly 5. Neuropsych/cognition: This patient is not capable of making decisions on her own behalf. 6. Skin/Wound Care: Routine skin checks 7. Fluids/Electrolytes/Nutrition: Routine in and outs with follow-up chemistries 8.  Dysphagia.  Dysphagia #1 thin liquids.  Follow-up speech therapy 9.  Hyperlipidemia.  Lipitor 10.  AKI.  Resolved.  Baseline creatinine 1.2.  Follow-up chemistries 11.  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  Dietary follow-up 12.  History of alcohol use.  Provide counseling 13.  Microcytic anemia.  Baseline hemoglobin 8-10.  Hemoglobin dipping to 7.2.  Currently stable.  Anemia panel consistent with iron deficiency anemia.  Iron supplement added. 14.  Hypertension.  Cozaar 25 mg daily, Bidil  20-37.5 mg 3 times daily.  Monitor with  increased mobility 15.  Constipation.  No bowel movement since admission.  Is passing flatus. 14.  Urinary incontinence.  Removed PureWick, monitor PVRs.   Toribio PARAS Angiulli, PA-C 08/01/2024   I have examined the patient independently and edited the note for HPI, ROS, exam, assessment, and plan as appropriate. I am in agreement with the above recommendations.     Joesph JAYSON Likes, DO 08/01/2024

## 2024-08-01 NOTE — TOC Progression Note (Signed)
 Transition of Care [facility]) - Progression Note    Patient Details  Name: Christie Meza MRN: [id number] Date of Birth: 04-22-1946  Transition of Care [facility] CM/SW Contact  [name], RN Phone Number: 08/01/2024, 2:04 PM  Clinical Narrative:     Plan for admit to CIR when bed available.  IP Care management following.  Expected Discharge Plan: IP Rehab Facility                 Expected Discharge Plan and Services                                               Social Drivers of Health (SDOH) Interventions SDOH Screenings   Food Insecurity: Patient Unable To Answer (07/28/2024)  Housing: Unknown (07/28/2024)  Transportation Needs: Patient Unable To Answer (07/28/2024)  Social Connections: Unknown (07/28/2024)  Tobacco Use: Low Risk  (02/25/2022)    Readmission Risk Interventions     No data to display

## 2024-08-01 NOTE — Progress Notes (Signed)
 Pt has arrived and is stable

## 2024-08-01 NOTE — Progress Notes (Signed)
   Inpatient Rehabilitation Admissions Coordinator   We have CIR bed to admit her to today. Acute team and TOC made aware. I will make the arrangements.  Heron Leavell, RN, MSN Rehab Admissions Coordinator [phone] 08/01/2024 3:33 PM

## 2024-08-01 NOTE — Progress Notes (Signed)
 Emeline Joesph BROCKS, DO  Physician Physical Medicine and Rehabilitation   PMR Pre-admission    Signed   Date of Service: 08/01/2024  9:17 AM  Related encounter: ED to Hosp-Admission (Current) from 07/27/2024 in [facility] 3W [facility]   Signed     Expand All Collapse All  Show:Clear all [x] Written[x] Templated[x] Copied  Added by: [x] Stephene Alegria, Heron MATSU, RN[x] Emeline Joesph BROCKS, DO[x] Butler Reche BRAVO, PT  [] Hover for details PMR Admission Coordinator Pre-Admission Assessment   Patient: Christie Meza is an 78 y.o., female MRN: [id number] DOB: 18-Oct-1946 Height:   Weight: 106 kg   Insurance Information HMO: yes    PPO:      PCP:      IPA:      80/20:      OTHER:  PRIMARY: [employer]      Policy#: [id number]       Subscriber: pt CM Name: Corean      Phone#: [phone]     Fax#: [phone] Pre-Cert#: [id number]  auth for CIR from [place] with [facility] for admit 10/9 with next review date 10/15.  Updates due to fax listed above.        Employer:  Benefits:  Phone #: [phone]     Name:  Eff. Date: 10/25/23     Deduct: $0      Out of Pocket Max: [phone] ($0 met)      Life Max:  CIR: $435/day for days 1-4      SNF: 20 full days Outpatient:      Co-Pay: $30/visit Home Health: 100%      Co-Pay:  DME: 80%     Co-Pay: 20% Providers:  SECONDARY:       Policy#:      Phone#:    Artist:       Phone#:    The Engineer, materials Information Summary" for patients in Inpatient Rehabilitation Facilities with attached "Privacy Act Statement-Health Care Records" was provided and verbally reviewed with: Patient and Family   Emergency Contact Information Contact Information       Name Relation Home Work Mobile    [place] Daughter [phone]             Other Contacts   None on File        Current Medical History  Patient Admitting Diagnosis: CVA    History of Present Illness: Pt is a 78 yo female with PMH Of HTN,  bipolar, obesity, previous CVA, who presented to [facility] on 10/4 with inability to talk with associated right facial droop and right sided weakness. CT head revealed mid to posterior located left frontal lobe infarct as well as left parietal lobe involvement.  MRI notable for acute nonhemorrhagic left MCA infarct involving frontal, parietal, and temporal lobes with subcortical edema and cerebral swelling.  MRA noted occlusion of the left MCA/M1 segment with moderate stenosis in the right posterior cerebral artery p2 segment.  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  EEG showed continuous slow activity in the left frontal temporal parietal lobes.  EF >75% with hyperdynamic function of LV and grade 1 diastolic dysfunction.   Neurology recommended aspirin  and plavix for three months, then aspirin  alone, but pt with aspirin  allergy so will do plavix alone.  Recommended 30-day heart monitor at discharge.  Hospital course microcytic anemia, permissive HTN.  Pt is tolerating a D1/thin liquid diet.  Therapy ongoing and recommendations are for CIR for multidisciplinary team approach  for stroke rehab.    Complete NIHSS TOTAL: 8   Patient's medical record from [facility] has been reviewed by the rehabilitation admission coordinator and physician.   Past Medical History      Past Medical History:  Diagnosis Date   Bipolar 1 disorder (HCC)     Hypertension     Obesity            Has the patient had major surgery during 100 days prior to admission? No   Family History   family history includes Hypertension in her mother; Kidney disease in her brother and mother.   Current Medications  Current Medications    Current Facility-Administered Medications:    acetaminophen  (TYLENOL ) tablet 650 mg, 650 mg, Oral, Q4H PRN, 650 mg at 07/30/24 1555 **OR** acetaminophen  (TYLENOL ) 160 MG/5ML solution 650 mg, 650 mg, Per Tube, Q4H PRN **OR** acetaminophen  (TYLENOL ) suppository 650 mg, 650 mg,  Rectal, Q4H PRN, Sim, Mohammad L, MD   atorvastatin (LIPITOR) tablet 40 mg, 40 mg, Oral, Daily, Garba, Mohammad L, MD, 40 mg at 08/01/24 0913   clopidogrel (PLAVIX) tablet 75 mg, 75 mg, Oral, Daily, Lindzen, Eric, MD, 75 mg at 08/01/24 0913   DULoxetine  (CYMBALTA ) DR capsule 30 mg, 30 mg, Oral, Daily, Rojelio Nest, DO, 30 mg at 08/01/24 0913   enoxaparin  (LOVENOX ) injection 40 mg, 40 mg, Subcutaneous, Daily, Sim, Mohammad L, MD, 40 mg at 08/01/24 9085   hydrALAZINE  (APRESOLINE ) injection 10 mg, 10 mg, Intravenous, Q6H PRN, Rojelio Nest, DO, 10 mg at 07/31/24 0406   isosorbide -hydrALAZINE  (BIDIL ) 20-37.5 MG per tablet 1 tablet, 1 tablet, Oral, TID, Rojelio Nest, DO, 1 tablet at 08/01/24 0913   losartan (COZAAR) tablet 25 mg, 25 mg, Oral, Daily, Briana Elgin LABOR, MD, 25 mg at 08/01/24 9086   QUEtiapine  (SEROQUEL ) tablet 50 mg, 50 mg, Oral, QHS, Rojelio Nest, DO, 50 mg at 07/31/24 2131   senna-docusate (Senokot-S) tablet 1 tablet, 1 tablet, Oral, QHS PRN, Sim Emery CROME, MD     Patients Current Diet:  Diet Order                  DIET - DYS 1 Room service appropriate? Yes with Assist; Fluid consistency: Thin  Diet effective now                         Precautions / Restrictions Precautions Precautions: Fall, Other (comment) Precaution/Restrictions Comments: R inattention Restrictions Weight Bearing Restrictions Per Provider Order: No    Has the patient had 2 or more falls or a fall with injury in the past year? No   Prior Activity Level Limited Community (1-2x/wk): indep prior to admit occasional use of SPC but dtr states didn't use it, not driving   Prior Functional Level Self Care: Did the patient need help bathing, dressing, using the toilet or eating? Independent   Indoor Mobility: Did the patient need assistance with walking from room to room (with or without device)? Independent   Stairs: Did the patient need assistance with internal or external stairs (with  or without device)? Independent   Functional Cognition: Did the patient need help planning regular tasks such as shopping or remembering to take medications? Independent   Patient Information Are you of Hispanic, Latino/a,or Spanish origin?: A. No, not of Hispanic, Latino/a, or Spanish origin What is your race?: B. Black or African American Do you need or want an interpreter to communicate with a doctor or health care staff?: 0.  No   Patient's Response To:  Health Literacy and Transportation Is the patient able to respond to health literacy and transportation needs?: Yes Health Literacy - How often do you need to have someone help you when you read instructions, pamphlets, or other written material from your doctor or pharmacy?: Never In the past 12 months, has lack of transportation kept you from medical appointments or from getting medications?: No In the past 12 months, has lack of transportation kept you from meetings, work, or from getting things needed for daily living?: No   Journalist, newspaper / Equipment Home Equipment: Shower seat, Medical laboratory scientific officer - single point   Prior Device Use: Indicate devices/aids used by the patient prior to current illness, exacerbation or injury? cane   Current Functional Level Cognition   Arousal/Alertness: Awake/alert Overall Cognitive Status: Impaired/Different from baseline Orientation Level: Oriented X4 Attention: Focused, Sustained Focused Attention: Impaired Focused Attention Impairment: Verbal basic, Verbal complex, Functional basic, Functional complex Sustained Attention: Impaired Sustained Attention Impairment: Verbal basic, Verbal complex, Functional basic, Functional complex Memory: Impaired Memory Impairment: Decreased recall of new information Awareness: Impaired Awareness Impairment: Intellectual impairment, Emergent impairment Problem Solving: Impaired Problem Solving Impairment: Functional basic, Verbal basic, Verbal complex, Functional  complex Executive Function: Sequencing, Organizing, Self Monitoring Sequencing: Impaired Organizing: Impaired Self Monitoring: Impaired Safety/Judgment: Impaired    Extremity Assessment (includes Sensation/Coordination)   Upper Extremity Assessment: Defer to OT evaluation, RUE deficits/detail RUE Deficits / Details: able to use with self care task but often drops washcloth without being aware RUE Coordination: decreased fine motor, decreased gross motor  Lower Extremity Assessment: Defer to PT evaluation     ADLs   Overall ADL's : Needs assistance/impaired Eating/Feeding: Sitting, Minimal assistance Grooming: Wash/dry hands, Wash/dry face, Oral care, Minimal assistance, Sitting, Cueing for sequencing Grooming Details (indicate cue type and reason): at sink with min assist for toothpaste Upper Body Bathing: Moderate assistance, Sitting, Cueing for sequencing Upper Body Bathing Details (indicate cue type and reason): patient using RUE to wash left side with patient often dropping washcloth Lower Body Bathing: Moderate assistance, Maximal assistance, Sit to/from stand Lower Body Bathing Details (indicate cue type and reason): able to bathe upper legs while standing and required max assist for peri area back Upper Body Dressing : Minimal assistance, Sitting Upper Body Dressing Details (indicate cue type and reason): gown change Lower Body Dressing: Maximal assistance, Sitting/lateral leans Toilet Transfer: Moderate assistance, Ambulation, Rolling walker (2 wheels) Toilet Transfer Details (indicate cue type and reason): simulated around bed Toileting- Clothing Manipulation and Hygiene: Maximal assistance Functional mobility during ADLs: Minimal assistance, Rolling walker (2 wheels) General ADL Comments: encouraged patient to use LUE with bathing     Mobility   Overal bed mobility: Needs Assistance Bed Mobility: Supine to Sit Supine to sit: Contact guard Sit to supine: Min  assist General bed mobility comments: patient requiring CGA to complete     Transfers   Overall transfer level: Needs assistance Equipment used: Rolling walker (2 wheels) Transfers: Sit to/from Stand, Bed to chair/wheelchair/BSC Sit to Stand: Min assist Bed to/from chair/wheelchair/BSC transfer type:: Step pivot Step pivot transfers: Min assist General transfer comment: min assist to stand from EOB and to ambulate to sink and then to recliner, cues for hand placement and often assistance to keep RUE on RW     Ambulation / Gait / Stairs / Wheelchair Mobility   Ambulation/Gait Ambulation/Gait assistance: Editor, commissioning (Feet): 8 Feet Assistive device: Rolling walker (2 wheels), None Gait Pattern/deviations: Step-through  pattern, Decreased step length - right, Drifts right/left General Gait Details: Assist for RW management with pt trying to ambulate with no AD. Slightly unsteady with difficulty sequencing RW for gait Gait velocity: decr Gait velocity interpretation: <1.8 ft/sec, indicate of risk for recurrent falls     Posture / Balance Dynamic Sitting Balance Sitting balance - Comments: EOB Balance Overall balance assessment: Needs assistance Sitting-balance support: No upper extremity supported, Feet supported Sitting balance-Leahy Scale: Fair Sitting balance - Comments: EOB Postural control: Posterior lean Standing balance support: Single extremity supported, Bilateral upper extremity supported, During functional activity Standing balance-Leahy Scale: Poor Standing balance comment: reliant on UE support when standing     Special considerations/life events  Diabetic management A1c 5.3 and Behavioral consideration bipolar    Previous Home Environment (from acute therapy documentation) Living Arrangements: Spouse/significant other  Lives With: Other (Comment) (pt states spouse but unsure if reliable hx and novel aphasia (past notes state grandson assisted in  2023)) Available Help at Discharge: Family Type of Home: House Home Layout: One level Home Access: Level entry Bathroom Shower/Tub: Tub/shower unit Additional Comments: Unclear if accurate home set-up   Discharge Living Setting Plans for Discharge Living Setting: Patient's home, Lives with (comment) (s/o [name]) Type of Home at Discharge: House Discharge Home Layout: One level Discharge Home Access: Level entry Discharge Bathroom Shower/Tub: Tub/shower unit Discharge Bathroom Toilet: Handicapped height Discharge Bathroom Accessibility: Yes How Accessible: Accessible via walker Does the patient have any problems obtaining your medications?: No   Social/Family/Support Systems Patient Roles: Partner Contact Information: [name] Dales Anticipated Caregiver: Daughter, [name] LABOR. Anticipated Caregiver's Contact Information: [phone] Ability/Limitations of Caregiver: none stated Caregiver Availability: 24/7 Discharge Plan Discussed with Primary Caregiver: Yes Is Caregiver In Agreement with Plan?: Yes Does Caregiver/Family have Issues with Lodging/Transportation while Pt is in Rehab?: No   Goals Patient/Family Goal for Rehab: PT/OT supervision, SLP supervision to min Expected length of stay: 7-10 days Additional Information: discharge plan: return to pt's home (first choice and set up above) with partner, [name], and daughter, [name], providing 24/7 supervision Pt/Family Agrees to Admission and willing to participate: Yes Program Orientation Provided & Reviewed with Pt/Caregiver Including Roles  & Responsibilities: Yes   Decrease burden of Care through IP rehab admission: n/a   Possible need for SNF placement upon discharge: Not anticipated.  Pt plan to d/c back to her home (preference) with s/o and daughter providing 24/7 supervision.  Daughter Alston) okay with pt discharging to her home if needed.    Patient Condition: This patient's condition remains as documented in the consult dated  07/30/24, in which the Rehabilitation Physician determined and documented that the patient's condition is appropriate for intensive rehabilitative care in an inpatient rehabilitation facility. Will admit to inpatient rehab pending insurance approval today.   Preadmission Screen Completed By:  Reche FORBES Lowers, PT, DPT 08/01/2024 3:39 PM ______________________________________________________________________   Discussed status with Dr. Emeline on 08/01/24 at 1539 and received approval for admission today.   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 Yes Co-Morbidities requiring supervision/potential complications:  - Post stroke dysphagia -Hypertension -Microcytic anemia Due to bladder management, bowel management, safety, skin/wound care, disease management, medication administration, pain management, and patient  education, does the patient require 24 hr/day rehab nursing? Yes Does the patient require coordinated care of a physician, rehab nurse, therapy disciplines of PT, OT, SLP to address physical and functional deficits in the context of the above medical diagnosis(es)? Yes Addressing deficits in the following areas: balance, endurance, locomotion, strength, transferring, bowel/bladder control, bathing, dressing, feeding, grooming, toileting, speech, language, swallowing, and psychosocial support Can the patient actively participate in an intensive therapy program of at least 3 hrs of therapy per day at least 5 days per week? Yes The potential for patient to make measurable gains while on inpatient rehab is excellent Anticipated functional outcomes upon discharge from inpatient  rehab are supervision  with PT, supervision with OT, supervision and min assist with SLP. Estimated rehab length of stay to reach the above functional goals is: 7-10 days Anticipated discharge destination: Home Overall Rehab/Functional Prognosis: excellent     MD Signature:   Joesph JAYSON Likes, DO 08/01/2024           Revision History

## 2024-08-01 NOTE — Progress Notes (Signed)
 Inpatient Rehab Admissions Coordinator:   I have no beds available for this patient to admit to CIR today.  Will continue to follow for timing of potential admission pending bed availability.   Reche Lowers, PT, DPT Admissions Coordinator [phone] 08/01/24  9:46 AM

## 2024-08-01 NOTE — Discharge Instructions (Addendum)
 Inpatient Rehab Discharge Instructions  Christie Meza Discharge date and time: No discharge date for patient encounter.   Activities/Precautions/ Functional Status: Activity: As tolerated Diet: Dysphagia #2 thin liquids Wound Care: Routine skin checks Functional status:  ___ No restrictions     ___ Walk up steps independently ___ 24/7 supervision/assistance   ___ Walk up steps with assistance ___ Intermittent supervision/assistance  ___ Bathe/dress independently ___ Walk with walker     _x__ Bathe/dress with assistance ___ Walk Independently    ___ Shower independently ___ Walk with assistance    ___ Shower with assistance ___ No alcohol     ___ Return to work/school ________  Special Instructions: No driving smoking or alcohol   COMMUNITY REFERRALS UPON DISCHARGE:    Home Health:   PT      OT      ST    SNA                 Agency:[facility]  Phone:[phone]  *Please expect follow-up within 2-3 business days for discharge to schedule your home visit. If you have not received follow-up, be sure to contact the site directly.*   Medical Equipment/Items Ordered:rolling walker ad wheelchair                                                 Agency/Supplier:[facility] [phone]  My questions have been answered and I understand these instructions. I will adhere to these goals and the provided educational materials after my discharge from the hospital.  Patient/Caregiver Signature _______________________________ Date __________  Clinician Signature _______________________________________ Date __________  Please bring this form and your medication list with you to all your follow-up doctor's appointments. STROKE/TIA DISCHARGE INSTRUCTIONS SMOKING Cigarette smoking nearly doubles your risk of having a stroke & is the single most alterable risk factor  If you smoke or have smoked in the last 12 months, you are advised to quit smoking for your health. Most of the excess  cardiovascular risk related to smoking disappears within a year of stopping. Ask you doctor about anti-smoking medications [place] Quit Line: 1-800-QUIT NOW Free Smoking Cessation Classes [phone]  CHOLESTEROL Know your levels; limit fat & cholesterol in your diet  Lipid Panel     Component Value Date/Time   CHOL 134 07/27/2024 2321   TRIG 62 07/27/2024 2321   HDL 49 07/27/2024 2321   CHOLHDL 2.7 07/27/2024 2321   VLDL 12 07/27/2024 2321   LDLCALC 73 07/27/2024 2321   LDLCALC 107 (H) 05/02/2023 1515     Many patients benefit from treatment even if their cholesterol is at goal. Goal: Total Cholesterol (CHOL) less than 160 Goal:  Triglycerides (TRIG) less than 150 Goal:  HDL greater than 40 Goal:  LDL (LDLCALC) less than 100   BLOOD PRESSURE American Stroke Association blood pressure target is less that 120/80 mm/Hg  Your discharge blood pressure is:  BP: 138/76 Monitor your blood pressure Limit your salt and alcohol intake Many individuals will require more than one medication for high blood pressure  DIABETES (A1c is a blood sugar average for last 3 months) Goal HGBA1c is under 7% (HBGA1c is blood sugar average for last 3 months)  Diabetes: No known diagnosis of diabetes    Lab Results  Component Value Date   HGBA1C 5.3 07/27/2024    Your HGBA1c can be  lowered with medications, healthy diet, and exercise. Check your blood sugar as directed by your physician Call your physician if you experience unexplained or low blood sugars.  PHYSICAL ACTIVITY/REHABILITATION Goal is 30 minutes at least 4 days per week  Activity: Increase activity slowly, Therapies: Physical Therapy: Home Health Return to work:  Activity decreases your risk of heart attack and stroke and makes your heart stronger.  It helps control your weight and blood pressure; helps you relax and can improve your mood. Participate in a regular exercise program. Talk with your doctor about the best form of exercise for  you (dancing, walking, swimming, cycling).  DIET/WEIGHT Goal is to maintain a healthy weight  Your discharge diet is:  Diet Order             DIET - DYS 1 Room service appropriate? Yes with Assist; Fluid consistency: Thin  Diet effective now                   liquids Your height is:  Height: 5' 5 (165.1 cm) Your current weight is: Weight: 98 kg Your Body Mass Index (BMI) is:  BMI (Calculated): 35.95 Following the type of diet specifically designed for you will help prevent another stroke. Your goal weight range is:   Your goal Body Mass Index (BMI) is 19-24. Healthy food habits can help reduce 3 risk factors for stroke:  High cholesterol, hypertension, and excess weight.  RESOURCES Stroke/Support Group:  Call [phone]   STROKE EDUCATION PROVIDED/REVIEWED AND GIVEN TO PATIENT Stroke warning signs and symptoms How to activate emergency medical system (call 911). Medications prescribed at discharge. Need for follow-up after discharge. Personal risk factors for stroke. Pneumonia vaccine given: No Flu vaccine given: No My questions have been answered, the writing is legible, and I understand these instructions.  I will adhere to these goals & educational materials that have been provided to me after my discharge from the hospital.

## 2024-08-01 NOTE — Progress Notes (Signed)
 Babs Arthea DASEN, MD  Physician Physical Medicine and Rehabilitation   Consult Note    Signed   Date of Service: 07/30/2024 11:00 AM  Related encounter: ED to Hosp-Admission (Current) from 07/27/2024 in [facility]   Signed     Expand All Collapse All  Show:Clear all [x] Written[x] Templated[] Copied  Added by: [x] Babs Arthea DASEN, MD  [] Hover for details          Physical Medicine and Rehabilitation Consult Reason for Consult:language and functional mobility deficits Referring Physician: Rosemarie     HPI: Christie Meza is a 78 y.o. female with a history of obesity, hypertension and bipolar disorder who presented on 07/27/2024 with inability to talk and right facial droop and right-sided weakness.  CT of the head revealed mid to posterior located left frontal lobe infarct as well as involvement of left parietal lobe.  MRI is notable for acute nonhemorrhagic left MCA infarct involving the frontal, parietal, and temporal lobes with associated subcortical edema and cerebral swelling.  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  EEG was done demonstrating continuous slow activity in the left frontal temporal parietal region.  Patient was placed on aspirin  and Plavix with the plan to continue this for 32-months and then moving to aspirin  alone.  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  Patient is currently on D1 thin liquid diet.  Patient was up with therapies yesterday and was min assist for sit to stand transfers and walked 50 feet using a rolling walker at a mod assist level.  Patient had frequent loss of contact of her right hand with the walker causing the walker to turn right.  Patient worked on basic ADLs who Occupational Therapy and was min to mod assist.  Patient lives in a home in a 1  level house with her spouse.  She has a single-point cane and apparently was modified independent with a cane prior to admit.       Home: Home Living Family/patient expects to be discharged to:: Private residence Living Arrangements: Spouse/significant other Available Help at Discharge: Family Type of Home: House Home Access: Level entry Home Layout: One level Bathroom Shower/Tub: Proofreader: Information systems manager, Medical laboratory scientific officer - single point Additional Comments: Unclear if accurate home set-up  Lives With: Other (Comment) (pt states spouse but unsure if reliable hx and novel aphasia (past notes state grandson assisted in 2023))  Functional History: Prior Function Prior Level of Function : Independent/Modified Independent, Patient poor historian/Family not available Mobility Comments: Pt reports being ModI with SP cane, unclear if accurate PLOF. Chart review from 2023 stated pt had assist for transfers ADLs Comments: Pt reports being Ind with ADLs and iADLs. Unclear if accurate PLOF. Chart review in 2023 stated pt had assist for ADLs. Does not drive Functional Status:  Mobility: Bed Mobility Overal bed mobility: Needs Assistance Bed Mobility: Supine to Sit Supine to sit: Min assist Sit to supine: Min assist General bed mobility comments: Assist for safety and to make sure RUE isn't left behind. Transfers Overall transfer level: Needs assistance Equipment used: None, Rolling walker (2 wheels) Transfers: Sit to/from Stand Sit to Stand: Min assist General transfer comment: Assist for balance and safety. Verbal cues for hand placement Ambulation/Gait Ambulation/Gait assistance: Mod assist Gait Distance (Feet): 50 Feet Assistive device: Rolling walker (2 wheels) Gait Pattern/deviations: Step-through  pattern, Decreased step length - right, Drifts right/left General Gait Details: Assist for balance, to manage walker, and for safety. Pt with frequent loss of contact with correct  placement of rt hand on the walker causing the walker to turn to the rt and pt unaware/unable to correct. Gait velocity: decr Gait velocity interpretation: <1.8 ft/sec, indicate of risk for recurrent falls   ADL: ADL Overall ADL's : Needs assistance/impaired Eating/Feeding: Sitting, Minimal assistance Grooming: Minimal assistance, Sitting Upper Body Bathing: Moderate assistance, Sitting Lower Body Bathing: Maximal assistance, Sitting/lateral leans Upper Body Dressing : Moderate assistance, Sitting Lower Body Dressing: Maximal assistance, Sitting/lateral leans Toilet Transfer: Moderate assistance, Ambulation, Rolling walker (2 wheels) Toilet Transfer Details (indicate cue type and reason): simulated around bed Toileting- Clothing Manipulation and Hygiene: Maximal assistance Functional mobility during ADLs: Moderate assistance, Rolling walker (2 wheels)   Cognition: Cognition Overall Cognitive Status: Impaired/Different from baseline Arousal/Alertness: Awake/alert Orientation Level: Oriented to person, Oriented to place, Disoriented to time, Disoriented to situation Month: October Day of Week: Incorrect Attention: Focused, Sustained Focused Attention: Impaired Focused Attention Impairment: Verbal basic, Verbal complex, Functional basic, Functional complex Sustained Attention: Impaired Sustained Attention Impairment: Verbal basic, Verbal complex, Functional basic, Functional complex Memory: Impaired Memory Impairment: Decreased recall of new information Awareness: Impaired Awareness Impairment: Intellectual impairment, Emergent impairment Problem Solving: Impaired Problem Solving Impairment: Functional basic, Verbal basic, Verbal complex, Functional complex Executive Function: Sequencing, Organizing, Self Monitoring Sequencing: Impaired Organizing: Impaired Self Monitoring: Impaired Safety/Judgment: Impaired Cognition Arousal: Alert Behavior During Therapy: Impulsive Overall  Cognitive Status: Impaired/Different from baseline     Review of Systems  Unable to perform ROS: Language  Constitutional: Negative.   Skin: Negative.        Past Medical History:  Diagnosis Date   Bipolar 1 disorder (HCC)     Hypertension     Obesity               Past Surgical History:  Procedure Laterality Date   ABDOMINAL HYSTERECTOMY   ~2002   ANTERIOR CERVICAL DECOMP/DISCECTOMY FUSION N/A 02/24/2022    Procedure: ANTERIOR CERVICAL THREE- FOUR AND CERVICAL FOUR THROUGH FIVE DECOMPRESSION/DISCECTOMY FUSION 2 LEVELS;  Surgeon: Colon Shove, MD;  Location: [facility] OR;  Service: Neurosurgery;  Laterality: N/A;             Family History  Problem Relation Age of Onset   Kidney disease Mother     Hypertension Mother     Kidney disease Brother          Social History:  reports that she has never smoked. She has never used smokeless tobacco. She reports current alcohol use. She reports current drug use. Allergies:  Allergies      Allergies  Allergen Reactions   Aspirin  Hives            Medications Prior to Admission  Medication Sig Dispense Refill   acetaminophen  (TYLENOL ) 325 MG tablet Take 2 tablets (650 mg total) by mouth every 4 (four) hours as needed for mild pain (or temp > 37.5 C (99.5 F)).       albuterol (VENTOLIN HFA) 108 (90 Base) MCG/ACT inhaler Inhale 1 puff into the lungs every 6 (six) hours as needed for shortness of breath.       bisacodyl  (DULCOLAX) 10 MG suppository Place 1 suppository (10 mg total) rectally daily as needed for moderate constipation. 12 suppository 0   diphenhydrAMINE (BENADRYL) 25 mg capsule Take 25 mg by mouth every 6 (six) hours as needed for itching.  docusate sodium  (COLACE) 100 MG capsule Take 1 capsule (100 mg total) by mouth 2 (two) times daily. 10 capsule 0   DULoxetine  (CYMBALTA ) 30 MG capsule Take 30 mg by mouth daily.       furosemide (LASIX) 20 MG tablet Take 20 mg by mouth daily as needed for fluid.       gabapentin   (NEURONTIN ) 100 MG capsule Take 100 mg by mouth 3 (three) times daily.       hydrALAZINE  (APRESOLINE ) 100 MG tablet Take 100 mg by mouth 2 (two) times daily.       HYDROcodone -acetaminophen  (NORCO/VICODIN) 5-325 MG tablet Take 1 tablet by mouth every 4 (four) hours as needed for moderate pain. 15 tablet 0   isosorbide -hydrALAZINE  (BIDIL ) 20-37.5 MG tablet Take 1 tablet by mouth 3 (three) times daily.       meloxicam (MOBIC) 15 MG tablet Take 15 mg by mouth daily.       omeprazole (PRILOSEC) 20 MG capsule Take 20 mg by mouth daily.       pravastatin  (PRAVACHOL ) 40 MG tablet Take 40 mg by mouth daily.       QUEtiapine  (SEROQUEL ) 50 MG tablet Take 50 mg by mouth at bedtime.       senna (SENOKOT) 8.6 MG TABS tablet Take 1 tablet (8.6 mg total) by mouth 2 (two) times daily. 120 tablet 0   tiZANidine (ZANAFLEX) 4 MG tablet Take 4 mg by mouth at bedtime as needed for muscle spasms.       traZODone  (DESYREL ) 100 MG tablet Take 100 mg by mouth at bedtime.       vitamin B-12 1000 MCG tablet Take 1 tablet (1,000 mcg total) by mouth daily.                Blood pressure (!) 161/74, pulse 91, temperature 98.7 F (37.1 C), temperature source Oral, resp. rate 16, weight 106 kg, SpO2 100%. Physical Exam Constitutional:      Appearance: She is obese.  HENT:     Head: Normocephalic.     Right Ear: External ear normal.     Left Ear: External ear normal.     Nose: Nose normal.     Mouth/Throat:     Mouth: Mucous membranes are moist.  Eyes:     Conjunctiva/sclera: Conjunctivae normal.  Cardiovascular:     Rate and Rhythm: Normal rate.  Pulmonary:     Effort: Pulmonary effort is normal.  Abdominal:     Palpations: Abdomen is soft.  Musculoskeletal:        General: Normal range of motion.     Cervical back: Normal range of motion.  Skin:    General: Skin is warm.  Neurological:     Mental Status: She is alert.     Comments: Pt is alert. Oriented to name, hospital, month, city of living. Follows  basic commands with extra time. Word finding deficits with fair comprehension and apraxic at times. Significant dysarthria and right central VII. Mild right hemiparesis, 4 to 4+/5 prox to distal RUE and RLE and 4+ to 5/5 LUE and LLE. Sensed pain in all 4's. No abnl resting tone. DTR's 1+  Psychiatric:        Mood and Affect: Mood normal.        Behavior: Behavior normal.       Lab Results Last 24 Hours       Results for orders placed or performed during the hospital encounter of 07/27/24 (from the past 24  hours)  CBC     Status: Abnormal    Collection Time: 07/30/24  5:26 AM  Result Value Ref Range    WBC 5.4 4.0 - 10.5 K/uL    RBC 3.05 (L) 3.87 - 5.11 MIL/uL    Hemoglobin 7.2 (L) 12.0 - 15.0 g/dL    HCT 77.5 (L) 63.9 - 46.0 %    MCV 73.4 (L) 80.0 - 100.0 fL    MCH 23.6 (L) 26.0 - 34.0 pg    MCHC 32.1 30.0 - 36.0 g/dL    RDW 83.7 (H) 88.4 - 15.5 %    Platelets 174 150 - 400 K/uL    nRBC 0.0 0.0 - 0.2 %  Basic metabolic panel with GFR     Status: Abnormal    Collection Time: 07/30/24  5:26 AM  Result Value Ref Range    Sodium 142 135 - 145 mmol/L    Potassium 3.8 3.5 - 5.1 mmol/L    Chloride 111 98 - 111 mmol/L    CO2 23 22 - 32 mmol/L    Glucose, Bld 107 (H) 70 - 99 mg/dL    BUN 9 8 - 23 mg/dL    Creatinine, Ser 8.92 (H) 0.44 - 1.00 mg/dL    Calcium 89.3 (H) 8.9 - 10.3 mg/dL    GFR, Estimated 54 (L) >60 mL/min    Anion gap 8 5 - 15  Magnesium      Status: None    Collection Time: 07/30/24  5:26 AM  Result Value Ref Range    Magnesium  1.9 1.7 - 2.4 mg/dL       Imaging Results (Last 48 hours)  ECHOCARDIOGRAM COMPLETE Result Date: 07/29/2024    ECHOCARDIOGRAM REPORT   Patient Name:   Christie Meza Date of Exam: 07/29/2024 Medical Rec #:  [id number]       Height:       65.0 in Accession #:    [id number]      Weight:       233.7 lb Date of Birth:  1946-09-16       BSA:          2.113 m Patient Age:    78 years        BP:           107/46 mmHg Patient Gender: F               HR:            85 bpm. Exam Location:  Inpatient Procedure: 2D Echo and Intracardiac Opacification Agent (Both Spectral and Color            Flow Doppler were utilized during procedure). Indications:    Stroke  History:        Patient has no prior history of Echocardiogram examinations.  Sonographer:    Charmaine Gaskins Referring Phys: [id number] MOHAMMAD L GARBA IMPRESSIONS  1. 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 Left ventricular ejection fraction, by estimation, is >75%. The left ventricle has hyperdynamic function. The left ventricle has no regional wall motion abnormalities. There is mild concentric left ventricular hypertrophy. Left ventricular diastolic parameters are consistent with Grade I diastolic dysfunction (impaired relaxation).  2. Right ventricular systolic function is normal. The right ventricular size is normal. There is mildly elevated pulmonary artery systolic pressure. The estimated right ventricular systolic pressure is 36.4 mmHg.  3. The mitral valve is normal in structure. Trivial mitral valve  regurgitation. No evidence of mitral stenosis.  4. The aortic valve is normal in structure. Aortic valve regurgitation is not visualized. No aortic stenosis is present.  5. The inferior vena cava is normal in size with greater than 50% respiratory variability, suggesting right atrial pressure of 3 mmHg. Conclusion(s)/Recommendation(s): No intracardiac source of embolism detected on this transthoracic study. Consider a transesophageal echocardiogram to exclude cardiac source of embolism if clinically indicated. 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 Left ventricular ejection fraction, by estimation, is >75%. The left ventricle has hyperdynamic function.  The left ventricle has no regional wall motion  abnormalities. The left ventricular internal cavity size was normal in size. There is mild concentric left ventricular hypertrophy. Left ventricular diastolic parameters are consistent with Grade I diastolic dysfunction (impaired relaxation). Right Ventricle: The right ventricular size is normal. No increase in right ventricular wall thickness. Right ventricular systolic function is normal. There is mildly elevated pulmonary artery systolic pressure. The tricuspid regurgitant velocity is 2.89  m/s, and with an assumed right atrial pressure of 3 mmHg, the estimated right ventricular systolic pressure is 36.4 mmHg. Left Atrium: Left atrial size was normal in size. Right Atrium: Right atrial size was normal in size. Pericardium: There is no evidence of pericardial effusion. Mitral Valve: The mitral valve is normal in structure. Trivial mitral valve regurgitation. No evidence of mitral valve stenosis. Tricuspid Valve: The tricuspid valve is normal in structure. Tricuspid valve regurgitation is trivial. No evidence of tricuspid stenosis. Aortic Valve: The aortic valve is normal in structure. Aortic valve regurgitation is not visualized. No aortic stenosis is present. Pulmonic Valve: The pulmonic valve was normal in structure. Pulmonic valve regurgitation is not visualized. No evidence of pulmonic stenosis. Aorta: The aortic root is normal in size and structure. Venous: The inferior vena cava is normal in size with greater than 50% respiratory variability, suggesting right atrial pressure of 3 mmHg. IAS/Shunts: No atrial level shunt detected by color flow Doppler.  LEFT VENTRICLE PLAX 2D LVIDd:         3.90 cm   Diastology LVIDs:         1.60 cm   LV e' medial:    0.05 cm/s LV PW:         1.20 cm   LV E/e' medial:  14.1 LV IVS:        1.30 cm   LV e' lateral:   0.07 cm/s LVOT diam:     2.10 cm   LV E/e' lateral: 10.5 LVOT Area:     3.46 cm  RIGHT VENTRICLE RV Basal diam:  2.90 cm RV Mid diam:    2.50 cm RV S prime:      15.60 cm/s LEFT ATRIUM             Index        RIGHT ATRIUM           Index LA diam:        2.70 cm 1.28 cm/m   RA Area:     12.40 cm LA Vol (A2C):   51.7 ml 24.46 ml/m  RA Volume:   26.80 ml  12.68 ml/m LA Vol (A4C):   40.9 ml 19.35 ml/m LA Biplane Vol: 46.3 ml 21.91 ml/m   AORTA Ao Root diam: 2.90 cm Ao Asc diam:  3.40 cm MITRAL VALVE  TRICUSPID VALVE MV Area (PHT): 7.59 cm     TR Peak grad:   33.4 mmHg MV Decel Time: 100 msec     TR Vmax:        289.00 cm/s MV E velocity: 0.72 cm/s MV A velocity: 101.00 cm/s  SHUNTS MV E/A ratio:  0.01         Systemic Diam: 2.10 cm Oneil Parchment MD Electronically signed by Oneil Parchment MD Signature Date/Time: 07/29/2024/1:48:21 PM    Final        Assessment/Plan: Diagnosis: 78 year old female with left MCA infarct and associated right hemiparesis and aphasia Does the need for close, 24 hr/day medical supervision in concert with the patient's rehab needs make it unreasonable for this patient to be served in a less intensive setting? Yes Co-Morbidities requiring supervision/potential complications:  - Post stroke dysphagia -Hypertension -Microcytic anemia Due to bladder management, bowel management, safety, skin/wound care, disease management, medication administration, pain management, and patient education, does the patient require 24 hr/day rehab nursing? Yes Does the patient require coordinated care of a physician, rehab nurse, therapy disciplines of PT, OT, SLP to address physical and functional deficits in the context of the above medical diagnosis(es)? Yes Addressing deficits in the following areas: balance, endurance, locomotion, strength, transferring, bowel/bladder control, bathing, dressing, feeding, grooming, toileting, speech, language, swallowing, and psychosocial support Can the patient actively participate in an intensive therapy program of at least 3 hrs of therapy per day at least 5 days per week? Yes The potential for patient to  make measurable gains while on inpatient rehab is excellent Anticipated functional outcomes upon discharge from inpatient rehab are supervision  with PT, supervision with OT, supervision and min assist with SLP. Estimated rehab length of stay to reach the above functional goals is: 7-10 days Anticipated discharge destination: Home Overall Rehab/Functional Prognosis: excellent   POST ACUTE RECOMMENDATIONS: This patient's condition is appropriate for continued rehabilitative care in the following setting: CIR Patient has agreed to participate in recommended program. Yes Note that insurance prior authorization may be required for reimbursement for recommended care.   Comment: Rehab Admissions Coordinator to follow up.         I have personally performed a face to face diagnostic evaluation of this patient. Additionally, I have examined the patient's medical record including any pertinent labs and radiographic images.     Thanks,   Arthea ONEIDA Gunther, MD 07/30/2024          Routing History

## 2024-08-01 NOTE — Discharge Summary (Signed)
 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 *  Hospital Course: Christie Meza is a 78 y.o. female with a history of hypertension, hyperlipidemia, bipolar disorder, chronic pain, alcohol abuse.  Patient presented secondary to right-sided facial droop and dysarthria concerning for a stroke. 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 No cardiac source identified. Patient to follow-up with neurology as an outpatient; plan for dual antiplatelet therapy for 3 months followed by Aspirin  monotherapy. Recommendation for discharge to acute inpatient rehabilitation.  Assessment and Plan:  Acute left MCA CVA Patient presented with symptoms including right-sided facial droop, aphasia and dysarthria. Initial CT head (10/4) significant for a small age-indeterminate cortical infarct within the mid-to-posterior left frontal lobe. MRI brain (10/5) confirms acute non-hemorrhagic left MCA territory infarct involving the frontal, parietal, and temporal lobes with associated subcortical edema and cerebral swelling without midline shift/mass. 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 LDL of 73. Hemoglobin A1C of 5.3%. Transthoracic Echocardiogram significant for an LVEF of > 75% with no intracardiac source of  embolism and no atrial level shunt. Neurology recommendations for Aspirin  and Plavix for 3 months followed by Aspirin  monotherapy and a 30-day heart monitor; patient unfortunately has an allergy to aspirin , so patient is on Plavix monotherapy. PT/OT recommendations for acute inpatient rehabilitation. Medication regimen on discharge includes Plavix 75 mg daily, Lipitor 40 mg daily. Recommendation for Neurology follow-up in 8 weeks.   Microcytic anemia Baseline hemoglobin of 8-10.  Hemoglobin of 8.1 g/dL on admission with downtrend to a low of 7.2 g/dL. Currently stable. Anemia panel consistent with iron deficiency anemia. Last colonoscopy from 2009 significant for hyperplastic polyps, diverticulosis and hemorrhoids. Iron supplementation on discharge. Recommend to repeat CBC in 3-5 days.   Hyperlipidemia Noted. Patient is on Pravastatin    Primary hypertension Patient is on Bidil  as an outpatient. Bidil  continued and losartan added. Patient will need continued titration of antihypertensives.   Bipolar disorder Mood disorder Continue Seroquel  and Cymbalta    AKI Baseline creatinine of 1.2. Creatinine of 1.54 on admission with peak of 1.60. Resolved.   Obesity, class II Estimated body mass index is 38.89 kg/m as calculated from the following:   Height as of 02/24/22: 5' 5 (1.651 m).   Weight as of this encounter: 106 kg.  Consultants:  Neurology   Procedures:  Transthoracic Echocardiogram  Disposition: Rehabilitation facility Diet recommendation: Cardiac diet   DISCHARGE MEDICATION: Allergies as of 08/01/2024       Reactions   Aspirin  Hives        Medication List     STOP taking these medications    hydrALAZINE  100 MG tablet Commonly known as: APRESOLINE    HYDROcodone -acetaminophen  5-325 MG tablet Commonly known as: NORCO/VICODIN   meloxicam 15 MG tablet Commonly known as: MOBIC   omeprazole  20 MG capsule Commonly known as: PRILOSEC   pravastatin  40 MG  tablet Commonly known as: PRAVACHOL        TAKE these medications    acetaminophen  325 MG tablet Commonly known as: TYLENOL  Take 2 tablets (650 mg total) by mouth every 4 (four) hours as needed for mild pain (or temp > 37.5 C (99.5 F)).   albuterol 108 (90 Base) MCG/ACT inhaler Commonly known as: VENTOLIN HFA Inhale 1 puff into the lungs every 6 (six) hours as needed for shortness of breath.   atorvastatin 40 MG tablet Commonly known as: LIPITOR Take 1 tablet (40 mg total) by mouth daily. Start taking on: August 02, 2024   bisacodyl  10 MG suppository Commonly known as: DULCOLAX Place 1 suppository (10 mg total) rectally daily as needed for moderate constipation.   clopidogrel 75 MG tablet Commonly known as: PLAVIX Take 1 tablet (75 mg total) by mouth daily. Start taking on: August 02, 2024   cyanocobalamin  1000 MCG tablet Take 1 tablet (1,000 mcg total) by mouth daily.   diphenhydrAMINE 25 mg capsule Commonly known as: BENADRYL Take 25 mg by mouth every 6 (six) hours as needed for itching.   docusate sodium  100 MG capsule Commonly known as: COLACE Take 1 capsule (100 mg total) by mouth 2 (two) times daily.   DULoxetine  30 MG capsule Commonly known as: CYMBALTA  Take 30 mg by mouth daily.   ferrous gluconate 324 MG tablet Commonly known as: FERGON Take 1 tablet (324 mg total) by mouth daily with breakfast.   furosemide 20 MG tablet Commonly known as: LASIX Take 20 mg by mouth daily as needed for fluid.   gabapentin  100 MG capsule Commonly known as: NEURONTIN  Take 100 mg by mouth 3 (three) times daily.   isosorbide -hydrALAZINE  20-37.5 MG tablet Commonly known as: BIDIL  Take 1 tablet by mouth 3 (three) times daily.   losartan 25 MG tablet Commonly known as: COZAAR Take 1 tablet (25 mg total) by mouth daily. Start taking on: August 02, 2024   QUEtiapine  50 MG tablet Commonly known as: SEROQUEL  Take 50 mg by mouth at bedtime.   senna 8.6 MG Tabs  tablet Commonly known as: SENOKOT Take 1 tablet (8.6 mg total) by mouth 2 (two) times daily.   tiZANidine 4 MG tablet Commonly known as: ZANAFLEX Take 4 mg by mouth at bedtime as needed for muscle spasms.   traZODone  100 MG tablet Commonly known as: DESYREL  Take 100 mg by mouth at bedtime.        Discharge Exam: BP (!) 148/71 (BP Location: Left Arm)   Pulse 90   Temp 97.7 F (36.5 C) (Oral)   Resp 19   Wt 106 kg   SpO2 100%   BMI 38.89 kg/m   General exam: Appears calm and comfortable. Respiratory system: Clear to auscultation. Respiratory effort normal. Cardiovascular system: S1 & S2 heard, RRR. No murmur. Gastrointestinal system: Abdomen is nondistended, soft and nontender. Normal bowel sounds heard. Central nervous system: Alert and oriented. Dysarthria. Psychiatry: Judgement and insight appear normal. Mood & affect appropriate.   Condition at discharge: stable  The results of significant diagnostics from this hospitalization (including imaging, microbiology, ancillary and laboratory) are listed below for reference.   Imaging Studies: ECHOCARDIOGRAM COMPLETE Result Date: 07/29/2024    ECHOCARDIOGRAM REPORT   Patient Name:   CLETA HEATLEY Date of Exam: 07/29/2024 Medical Rec #:  [id number]       Height:       65.0 in Accession #:  [id number]      Weight:       233.7 lb Date of Birth:  08/05/1946       BSA:          2.113 m Patient Age:    78 years        BP:           107/46 mmHg Patient Gender: F               HR:           85 bpm. Exam Location:  Inpatient Procedure: 2D Echo and Intracardiac Opacification Agent (Both Spectral and Color            Flow Doppler were utilized during procedure). Indications:    Stroke  History:        Patient has no prior history of Echocardiogram examinations.  Sonographer:    Charmaine Gaskins Referring Phys: [id number] MOHAMMAD L GARBA IMPRESSIONS  1. Moderate dynamic left ventricular outflow tract obstruction from hyperdynamic contraction:  Valsalva 3.9 m/s, peak gradient 61 mmHg. Left ventricular ejection fraction, by estimation, is >75%. The left ventricle has hyperdynamic function. The left ventricle has no regional wall motion abnormalities. There is mild concentric left ventricular hypertrophy. Left ventricular diastolic parameters are consistent with Grade I diastolic dysfunction (impaired relaxation).  2. Right ventricular systolic function is normal. The right ventricular size is normal. There is mildly elevated pulmonary artery systolic pressure. The estimated right ventricular systolic pressure is 36.4 mmHg.  3. The mitral valve is normal in structure. Trivial mitral valve regurgitation. No evidence of mitral stenosis.  4. The aortic valve is normal in structure. Aortic valve regurgitation is not visualized. No aortic stenosis is present.  5. The inferior vena cava is normal in size with greater than 50% respiratory variability, suggesting right atrial pressure of 3 mmHg. Conclusion(s)/Recommendation(s): No intracardiac source of embolism detected on this transthoracic study. Consider a transesophageal echocardiogram to exclude cardiac source of embolism if clinically indicated. 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 Left ventricular ejection fraction, by estimation, is >75%. The left ventricle has hyperdynamic function.  The left ventricle has no regional wall motion abnormalities. The left ventricular internal cavity size was normal in size. There is mild concentric left ventricular hypertrophy. Left ventricular diastolic parameters are consistent with Grade I diastolic dysfunction (impaired relaxation). Right Ventricle: The right ventricular size is normal. No increase in right ventricular wall thickness. Right ventricular systolic function is normal. There is mildly elevated pulmonary artery systolic pressure. The  tricuspid regurgitant velocity is 2.89  m/s, and with an assumed right atrial pressure of 3 mmHg, the estimated right ventricular systolic pressure is 36.4 mmHg. Left Atrium: Left atrial size was normal in size. Right Atrium: Right atrial size was normal in size. Pericardium: There is no evidence of pericardial effusion. Mitral Valve: The mitral valve is normal in structure. Trivial mitral valve regurgitation. No evidence of mitral valve stenosis. Tricuspid Valve: The tricuspid valve is normal in structure. Tricuspid valve regurgitation is trivial. No evidence of tricuspid stenosis. Aortic Valve: The aortic valve is normal in structure. Aortic valve regurgitation is not visualized. No aortic stenosis is present. Pulmonic Valve: The pulmonic valve was normal in structure. Pulmonic valve regurgitation is not visualized. No evidence of pulmonic stenosis. Aorta: The aortic root is normal in size and structure. Venous: The inferior vena cava is normal in size with greater than  50% respiratory variability, suggesting right atrial pressure of 3 mmHg. IAS/Shunts: No atrial level shunt detected by color flow Doppler.  LEFT VENTRICLE PLAX 2D LVIDd:         3.90 cm   Diastology LVIDs:         1.60 cm   LV e' medial:    0.05 cm/s LV PW:         1.20 cm   LV E/e' medial:  14.1 LV IVS:        1.30 cm   LV e' lateral:   0.07 cm/s LVOT diam:     2.10 cm   LV E/e' lateral: 10.5 LVOT Area:     3.46 cm  RIGHT VENTRICLE RV Basal diam:  2.90 cm RV Mid diam:    2.50 cm RV S prime:     15.60 cm/s LEFT ATRIUM             Index        RIGHT ATRIUM           Index LA diam:        2.70 cm 1.28 cm/m   RA Area:     12.40 cm LA Vol (A2C):   51.7 ml 24.46 ml/m  RA Volume:   26.80 ml  12.68 ml/m LA Vol (A4C):   40.9 ml 19.35 ml/m LA Biplane Vol: 46.3 ml 21.91 ml/m   AORTA Ao Root diam: 2.90 cm Ao Asc diam:  3.40 cm MITRAL VALVE                TRICUSPID VALVE MV Area (PHT): 7.59 cm     TR Peak grad:   33.4 mmHg MV Decel Time: 100 msec      TR Vmax:        289.00 cm/s MV E velocity: 0.72 cm/s MV A velocity: 101.00 cm/s  SHUNTS MV E/A ratio:  0.01         Systemic Diam: 2.10 cm Oneil Parchment MD Electronically signed by Oneil Parchment MD Signature Date/Time: 07/29/2024/1:48:21 PM    Final    MR Angio Head WO CONTRAST Addendum Date: 07/28/2024 ADDENDUM REPORT: 07/28/2024 10:46 ADDENDUM: Impression #2 called by telephone at the time of interpretation on 07/28/2024 at 10:43 am to provider Dr. Rojelio, who verbally acknowledged these results. Electronically Signed   By: Rockey Childs D.O.   On: 07/28/2024 10:46   Result Date: 07/28/2024 CLINICAL DATA:  Provided history: Neuro deficit, acute, stroke suspected. EXAM: MRA HEAD WITHOUT CONTRAST TECHNIQUE: Angiographic images of the Circle of Willis were acquired using MRA technique without intravenous contrast. COMPARISON:  Same-day brain MRI 07/28/2024. Non-contrast head CT and CT angiogram head/neck 07/27/2024. FINDINGS: Anterior circulation: The intracranial internal carotid arteries are patent. Mild atherosclerotic irregularity of both vessels. The left middle cerebral artery M1 segment is occluded at its mid-to-distal aspect. There is some flow related signal within M2 and more distal left MCA branches, likely due to collateral flow. The right middle cerebral artery M1 segment is patent. No right M2 proximal branch occlusion or high-grade proximal stenosis is identified. The anterior cerebral arteries are patent. 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 Posterior circulation: The proximal intracranial vertebral arteries, and proximal right posterior inferior cerebellar artery, are excluded from the field of view. The visible intracranial vertebral arteries are patent. The basilar artery is patent. The posterior cerebral arteries are patent. Redemonstrated moderate stenosis within the right posterior cerebral artery P2 segment.  A right posterior  communicating artery is present. The left posterior communicating artery is diminutive or absent. Anatomic variants: As described. Attempts are being made to reach the ordering provider at this time. IMPRESSION: 1. The proximal intracranial vertebral arteries, and proximal right posterior inferior cerebellar artery (PICA), are excluded from the field of view. Within this limitation, findings are as follows. 2. The left middle cerebral artery M1 segment is occluded at its mid-to-distal aspect. 3. Redemonstrated moderate stenosis within the right posterior cerebral artery P2 segment. 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 Electronically Signed: By: Rockey Childs D.O. On: 07/28/2024 10:15   EEG adult Result Date: 07/28/2024 Shelton Arlin KIDD, MD     07/28/2024 10:43 AM Patient Name: MCKAYLA MULCAHEY MRN: [id number] Epilepsy Attending: Arlin KIDD Shelton Referring Physician/Provider: Rosemarie Eather RAMAN, MD Date: 07/28/2024 Duration: 22.30 mins Patient history: 78 y.o. female with hx of bipolar 1 diorder, HTN and obesity, who presents to the ED from home via EMS as a Code Stroke, after family noted that she suddenly could not talk.  EEG to evaluate for seizure Level of alertness: Awake, asleep AEDs during EEG study: None Technical aspects: This EEG study was done with scalp electrodes positioned according to the 10-20 International system of electrode placement. Electrical activity was reviewed with band pass filter of 1-70Hz , sensitivity of 7 uV/mm, display speed of 72mm/sec with a 60Hz  notched filter applied as appropriate. EEG data were recorded continuously and digitally stored.  Video monitoring was available and reviewed as appropriate. Description: The posterior dominant rhythm consists of 8-9 Hz activity of moderate voltage (25-35 uV) seen predominantly in posterior head regions, symmetric and reactive to eye opening and eye closing. Sleep was characterized by vertex waves, sleep  spindles (12 to 14 Hz), maximal frontocentral region.  There is continuous sharply contoured 3 to 6 Hz theta-delta slowing in left fronto-temporo-parietal region. Hyperventilation and photic stimulation were not performed.   ABNORMALITY - Continuous slow, left fronto-temporo-parietal region IMPRESSION: This study is suggestive of cortical dysfunction arising from left fronto-temporo-parietal region likely secondary to underlying structural abnormality. No seizures or definite epileptiform discharges were seen throughout the recording. If suspicion for interictal activity remains a concern, a prolonged study including sleep can be considered. Arlin KIDD Shelton   MR Brain Wo Contrast (neuro protocol) Result Date: 07/28/2024 EXAM: MRI BRAIN WITHOUT CONTRAST 07/28/2024 04:55:37 AM TECHNIQUE: Multiplanar multisequence MRI of the head/brain was performed without the administration of intravenous contrast. BLADE sequences were utilized to compensate for patient motion. The study is mildly degraded by patient motion. COMPARISON: CT of the head dated 07/27/2024 and MRI of the head dated 02/23/2022. CLINICAL HISTORY: Neuro deficit, acute, stroke suspected. Patient confused, not able to hold still. BLADE sequences utilized to compensate for motion. FINDINGS: BRAIN AND VENTRICLES: There is restricted diffusion within the cortex of the left frontal, parietal and temporal lobes compatible with acute non-hemorrhagic MCA distribution infarction. There is subcortical edema with mild cerebral swelling but no significant mass effect or midline shift. There is mild-to-moderate subcortical cerebral white matter disease. No intracranial hemorrhage. No mass. No midline shift. No hydrocephalus. The sella is unremarkable. Normal flow voids. ORBITS: No acute abnormality. SINUSES AND MASTOIDS: No acute abnormality. BONES AND SOFT TISSUES: Normal marrow signal. No acute soft tissue abnormality. IMPRESSION: 1. Acute non-hemorrhagic left MCA  territory infarction involving the frontal, parietal, and temporal lobes with associated subcortical edema and mild cerebral swelling. No significant mass effect or midline shift. 2.  Mild-to-moderate subcortical white matter disease. 3. Mild motion-related image degradation. Electronically signed by: Evalene Coho MD 07/28/2024 05:06 AM EDT RP Workstation: [id number]   CT ANGIO HEAD NECK W WO CM (CODE STROKE) Addendum Date: 07/27/2024 ADDENDUM REPORT: 07/27/2024 16:34 ADDENDUM: Due to motion on the initial acquisition, a repeat scan was performed. Unchanged appearance of the M1 and more distal left middle cerebral artery vessels. 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 Beyond this, there is apparent occlusion of multiple left middle cerebral artery branches (beginning at the proximal M2 segment level). These results were called by telephone at the time of interpretation on 07/27/2024 at 4:25 pm to provider ERIC [facility] , who verbally acknowledged these results. The previously described stenosis of the paraclinoid left ICA appears moderate-to-severe on the most recent CTA acquisition. The previously described stenosis within the right posterior cerebral artery P2 segment appears severe on the most recent CTA acquisition. Electronically Signed   By: Rockey Childs D.O.   On: 07/27/2024 16:34   Result Date: 07/27/2024 CLINICAL DATA:  Provided history: Neuro deficit, acute, stroke suspected. EXAM: CT ANGIOGRAPHY HEAD AND NECK WITH AND WITHOUT CONTRAST TECHNIQUE: Multidetector CT imaging of the head and neck was performed using the standard protocol during bolus administration of intravenous contrast. Multiplanar CT image reconstructions and MIPs were obtained to evaluate the vascular anatomy. Carotid stenosis measurements (when applicable) are obtained utilizing NASCET criteria, using the distal internal carotid diameter as the denominator. RADIATION  DOSE REDUCTION: This exam was performed according to the departmental dose-optimization program which includes automated exposure control, adjustment of the mA and/or kV according to patient size and/or use of iterative reconstruction technique. CONTRAST:  Administered contrast not known at this time. COMPARISON:  Noncontrast head CT performed earlier today 07/27/2024. FINDINGS: CTA NECK FINDINGS Aortic arch: Standard aortic branching. Minimal atherosclerotic plaque within the aortic arch. Streak/beam hardening artifact arising from a dense contrast bolus partially obscures the left subclavian artery. Within this limitation, there is no appreciable hemodynamically significant innominate or proximal subclavian artery stenosis. Right carotid system: CCA and ICA patent within the neck without stenosis or significant atherosclerotic disease. Partially retropharyngeal course of the cervical ICA. Left carotid system: CCA and ICA patent within the neck without stenosis. Minimal atherosclerotic plaque about the carotid bifurcation. Vertebral arteries: Venous reflux of contrast partially obscures the left vertebral artery proximal V1 segment. Within this limitation, the vertebral arteries are codominant and patent within the neck without stenosis or significant aspect disease. Skeleton: Prior C3-C5 ACDF. Cervical and thoracic spondylosis. No acute fracture or aggressive osseous lesion. Other neck: Multiple thyroid  nodules (the largest an exophytic nodule projecting inferiorly from the lower pole of the right thyroid  lobe measuring 3 cm). This nodule extends into the superior mediastinum. Upper chest: No consolidation within the imaged lung apices. Review of the MIP images confirms the above findings CTA HEAD FINDINGS Moderately motion degraded examination. Within this limitation, findings are as follows. Anterior circulation: The intracranial internal carotid arteries are patent. Atherosclerotic plaque within both vessels.  Most notably, there is up to moderate stenosis of the paraclinoid ICAs, bilaterally. Occlusion versus severe near-occlusive stenosis within the left middle cerebral artery mid-to-distal M1 segment (for instance as seen on series 10, image 19). Beyond this, multiple left MCA branches are poorly delineated (beginning at the proximal M2 segment level) and are likely occluded. The right middle cerebral artery M1 segment is patent. No right M2 proximal branch occlusion or high-grade proximal  stenosis is identified. The anterior cerebral arteries are patent. No intracranial aneurysm is identified. Posterior circulation: The intracranial vertebral arteries are patent. The basilar artery is patent. The posterior cerebral arteries are patent. Moderate stenosis within the right posterior cerebral artery P2 segment. Posterior communicating arteries are diminutive or absent, bilaterally. Venous sinuses: Within the limitations of contrast timing, no convincing thrombus. Anatomic variants: As described. Review of the MIP images confirms the above findings CTA head impressiopn #2 called by telephone at the time of interpretation on 07/27/2024 at 3:35 pm to provider ERIC [facility] , who verbally acknowledged these results. IMPRESSION: CTA neck: 1. The common carotid and internal carotid arteries are patent within the neck without stenosis. Minimal atherosclerotic plaque on the left. 2. Venous reflux of contrast partially obscures the left vertebral artery proximal V1 segment. Within this limitation, the vertebral arteries are patent within the neck without stenosis or significant atherosclerotic disease. 3. Aortic Atherosclerosis (ICD10-I70.0). 4. 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 A nonemergent thyroid  ultrasound is recommended for further evaluation. Reference: J Am Coll Radiol. 2015 Feb;12(2): 143-50. CTA head: 1. Moderately motion degraded examination. Within this limitation, findings are as follows. 2. Occlusion  versus severe-near occlusive stenosis of the left middle cerebral artery mid-to-distal M1 segment. Beyond this, multiple left MCA branches are also poorly delineated (beginning at the proximal M2 segment level) and are likely occluded. 3. Background intracranial atherosclerotic disease as described within the body of the report. Up to moderate stenosis of the paraclinoid internal carotid arteries, bilaterally. Moderate stenosis within the right posterior cerebral artery P2 segment. Electronically Signed: By: Rockey Childs D.O. On: 07/27/2024 15:51   CT HEAD CODE STROKE WO CONTRAST Result Date: 07/27/2024 CLINICAL DATA:  Code stroke. Provided history: Neuro deficit, acute, stroke suspected. EXAM: CT HEAD WITHOUT CONTRAST TECHNIQUE: Contiguous axial images were obtained from the base of the skull through the vertex without intravenous contrast. RADIATION DOSE REDUCTION: This exam was performed according to the departmental dose-optimization program which includes automated exposure control, adjustment of the mA and/or kV according to patient size and/or use of iterative reconstruction technique. COMPARISON:  Brain MRI 02/23/2022.  Head CT 02/23/2022. FINDINGS: Brain: Generalized cerebral atrophy. A cortical/subcortical infarct within the left parietal lobe has progressed in extent as compared to the brain MRI of 02/23/2022. Small age-indeterminate cortical infarct within the mid to posterior left frontal lobe, new from the prior MRI (series 5, image 37) (series 2, image 23). Background mild patchy and ill-defined hypoattenuation within the cerebral white matter, nonspecific but compatible with chronic small vessel image disease. There is no acute intracranial hemorrhage. No extra-axial fluid collection. No evidence of an intracranial mass. No midline shift. Vascular: No hyperdense vessel.  Atherosclerotic calcifications. Skull: No calvarial fracture or aggressive osseous lesion. Sinuses/Orbits: No mass or acute  finding within the imaged orbits. Trace consult thickening within the bilateral ethmoid sinuses. ASPECTS [facility] Stroke Program Early CT Score) - Ganglionic level infarction (caudate, lentiform nuclei, internal capsule, insula, M1-M3 cortex): 6 - Supraganglionic infarction (M4-M6 cortex): 1 Total score (0-10 with 10 being normal): 7 Age-indeterminate infarcts within the left frontal and left parietal lobes. These results were communicated to Dr. Lindzen at 3:19 pmon 10/4/2025by text page via the [facility] messaging system. IMPRESSION: 1. Small age-indeterminate cortical infarct within the mid-to-posterior left frontal lobe, new from the prior brain MRI of 02/23/2022. Additionally, a left parietal lobe cortical/subcortical infarct has increased in extent raising the possibility of an acute on chronic infarct at this site. Consider  a brain MRI for further evaluation. ASPECTS is 7. 2. Background parenchymal atrophy and chronic small vessel ischemic disease. Electronically Signed   By: Rockey Childs D.O.   On: 07/27/2024 15:20    Microbiology: Results for orders placed or performed in visit on 05/02/23  Urine Culture     Status: Abnormal   Collection Time: 05/02/23  3:15 PM  Result Value Ref Range Status   MICRO NUMBER: [id number]  Final   SPECIMEN QUALITY: Adequate  Final   Sample Source BLADDER  Final   STATUS: FINAL  Final   ISOLATE 1: Escherichia coli (A)  Final    Comment: Greater than 100,000 CFU/mL of Escherichia coli      Susceptibility   Escherichia coli - URINE CULTURE, REFLEX    AMOX/CLAVULANIC 16 Intermediate     AMPICILLIN >=32 Resistant     AMPICILLIN/SULBACTAM >=32 Resistant     CEFAZOLIN * 8 Resistant      * For uncomplicated UTI caused by E. coli, K. pneumoniae or P. mirabilis: Cefazolin  is susceptible if MIC <32 mcg/mL and predicts susceptible to the oral agents cefaclor, cefdinir, cefpodoxime , cefprozil, cefuroxime, cephalexin  and loracarbef.     CEFTAZIDIME <=1 Sensitive      CEFEPIME <=1 Sensitive     CEFTRIAXONE  <=1 Sensitive     CIPROFLOXACIN <=0.25 Sensitive     LEVOFLOXACIN <=0.12 Sensitive     GENTAMICIN >=16 Resistant     IMIPENEM <=0.25 Sensitive     NITROFURANTOIN <=16 Sensitive     PIP/TAZO 8 Sensitive     TOBRAMYCIN 2 Sensitive     TRIMETH/SULFA* >=320 Resistant      * For uncomplicated UTI caused by E. coli, K. pneumoniae or P. mirabilis: Cefazolin  is susceptible if MIC <32 mcg/mL and predicts susceptible to the oral agents cefaclor, cefdinir, cefpodoxime , cefprozil, cefuroxime, cephalexin  and loracarbef. Legend: S = Susceptible  I = Intermediate R = Resistant  NS = Not susceptible * = Not tested  NR = Not reported **NN = See antimicrobic comments     Labs: CBC: Recent Labs  Lab 07/27/24 1503 07/27/24 1505 07/27/24 2321 07/29/24 0519 07/30/24 0526 07/31/24 0134  WBC 5.3  --  6.1 5.4 5.4 6.3  NEUTROABS 3.2  --   --   --   --   --   HGB 8.1* 8.5* 7.9* 7.3* 7.2* 7.3*  HCT 25.5* 25.0* 24.4* 22.8* 22.4* 22.7*  MCV 75.0*  --  74.4* 73.8* 73.4* 73.5*  PLT 194  --  187 163 174 167   Basic Metabolic Panel: Recent Labs  Lab 07/27/24 1503 07/27/24 1505 07/27/24 2321 07/28/24 2345 07/29/24 0519 07/30/24 0526 07/31/24 0134  NA 140 143  --  142 141 142 141  K 3.9 4.0  --  3.3* 3.4* 3.8 3.6  CL 111 110  --  111 109 111 111  CO2 22  --   --  22 21* 23 21*  GLUCOSE 104* 103*  --  96 95 107* 105*  BUN 13 13  --  9 10 9 8   CREATININE 1.54* 1.60* 1.16* 1.13* 1.09* 1.07* 1.04*  CALCIUM 10.7*  --   --  10.4* 10.2 10.6* 10.6*  MG  --   --   --  1.6*  --  1.9 1.8   Liver Function Tests: Recent Labs  Lab 07/27/24 1503  AST 22  ALT 11  ALKPHOS 149*  BILITOT 0.7  PROT 6.7  ALBUMIN 3.6   CBG: Recent Labs  Lab 07/27/24 1459  GLUCAP 106*    Discharge time spent: 35 minutes.  Signed: Elgin Lam, MD [facility] 08/01/2024

## 2024-08-01 NOTE — Progress Notes (Signed)
 Occupational Therapy Treatment Patient Details Name: Christie Meza MRN: [id number] DOB: 1946/08/01 Today's Date: 08/01/2024   History of present illness 78 y.o. female presents to [facility] 07/27/24 with R sided facial droop and slurred speech. 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 PMHx: HTN, bipolar disorder, obesity, chronic pain syndrome, alcohol abuse, urine incontinence   OT comments  Patient demonstrating good gains with OT treatment with CGA to get to EOB. Patient was min assist to stand and ambulate to sink with assistance to keep RUE on walker. Patient performed bathing seated at sink with cues for sequencing and mod assist for UB and mod/max assist for LB with patient attempting to use RUE but often dropping washcloth. Patient demonstrated improved balance when ambulating to recliner but continues to require min assist for RUE.  Patient will benefit from intensive inpatient follow-up therapy, >3 hours/day.  Acute OT to continue to follow to address established goals to facilitate DC to next venue of care.        If plan is discharge home, recommend the following:  A little help with walking and/or transfers;A lot of help with bathing/dressing/bathroom;Assistance with cooking/housework;Direct supervision/assist for medications management;Direct supervision/assist for financial management;Assist for transportation   Equipment Recommendations  Other (comment) (defer)    Recommendations for Other Services      Precautions / Restrictions Precautions Precautions: Fall;Other (comment) Recall of Precautions/Restrictions: Impaired Precaution/Restrictions Comments: R inattention Restrictions Weight Bearing Restrictions Per Provider Order: No       Mobility Bed Mobility Overal bed mobility: Needs Assistance Bed Mobility: Supine to Sit     Supine  to sit: Contact guard     General bed mobility comments: patient requiring CGA to complete    Transfers Overall transfer level: Needs assistance Equipment used: Rolling walker (2 wheels) Transfers: Sit to/from Stand, Bed to chair/wheelchair/BSC Sit to Stand: Min assist           General transfer comment: min assist to stand from EOB and to ambulate to sink and then to recliner, cues for hand placement and often assistance to keep RUE on RW     Balance Overall balance assessment: Needs assistance Sitting-balance support: No upper extremity supported, Feet supported Sitting balance-Leahy Scale: Fair Sitting balance - Comments: EOB   Standing balance support: Single extremity supported, Bilateral upper extremity supported, During functional activity Standing balance-Leahy Scale: Poor Standing balance comment: reliant on UE support when standing                           ADL either performed or assessed with clinical judgement   ADL Overall ADL's : Needs assistance/impaired     Grooming: Wash/dry hands;Wash/dry face;Oral care;Minimal assistance;Sitting;Cueing for sequencing Grooming Details (indicate cue type and reason): at sink with min assist for toothpaste Upper Body Bathing: Moderate assistance;Sitting;Cueing for sequencing Upper Body Bathing Details (indicate cue type and reason): patient using RUE to wash left side with patient often dropping washcloth Lower Body Bathing: Moderate assistance;Maximal assistance;Sit to/from stand Lower Body Bathing Details (indicate cue type and reason): able to bathe upper legs while standing and required max assist for peri area back Upper Body Dressing : Minimal assistance;Sitting Upper Body Dressing Details (indicate cue type and reason): gown change                 Functional mobility during ADLs: Minimal assistance;Rolling walker (2  wheels) General ADL Comments: encouraged patient to use LUE with bathing     Extremity/Trunk Assessment Upper Extremity Assessment RUE Deficits / Details: able to use with self care task but often drops washcloth without being aware RUE Coordination: decreased fine motor;decreased gross motor            Vision       Perception     Praxis     Communication Communication Communication: Impaired Factors Affecting Communication: Difficulty expressing self;Reduced clarity of speech   Cognition Arousal: Alert Behavior During Therapy: Impulsive Cognition: Cognition impaired   Orientation impairments: Situation Awareness: Online awareness impaired Memory impairment (select all impairments): Short-term memory Attention impairment (select first level of impairment): Focused attention Executive functioning impairment (select all impairments): Sequencing, Reasoning OT - Cognition Comments: demonstrates right inattention                 Following commands: Impaired Following commands impaired: Follows one step commands inconsistently, Follows one step commands with increased time      Cueing   Cueing Techniques: Verbal cues, Tactile cues  Exercises      Shoulder Instructions       General Comments VSS on RA    Pertinent Vitals/ Pain       Pain Assessment Pain Assessment: Faces Faces Pain Scale: No hurt  Home Living                                          Prior Functioning/Environment              Frequency  Min 2X/week        Progress Toward Goals  OT Goals(current goals can now be found in the care plan section)  Progress towards OT goals: Progressing toward goals  Acute Rehab OT Goals Patient Stated Goal: none stated OT Goal Formulation: With patient Time For Goal Achievement: 08/11/24 Potential to Achieve Goals: Good ADL Goals Pt Will Perform Grooming: sitting;with contact guard assist Pt Will Perform Upper Body Dressing: with min assist;sitting Pt Will Perform Lower Body Dressing: with min  assist;sitting/lateral leans;sit to/from stand Pt Will Transfer to Toilet: with min assist;stand pivot transfer;bedside commode Pt Will Perform Tub/Shower Transfer: Tub transfer;Shower transfer;shower seat;Stand pivot transfer;Squat pivot transfer;with min assist  Plan      Co-evaluation                 AM-PAC OT 6 Clicks Daily Activity     Outcome Measure   Help from another person eating meals?: A Little Help from another person taking care of personal grooming?: A Little Help from another person toileting, which includes using toliet, bedpan, or urinal?: A Lot Help from another person bathing (including washing, rinsing, drying)?: A Lot Help from another person to put on and taking off regular upper body clothing?: A Lot Help from another person to put on and taking off regular lower body clothing?: A Lot 6 Click Score: 14    End of Session Equipment Utilized During Treatment: Gait belt;Rolling walker (2 wheels)  OT Visit Diagnosis: Other abnormalities of gait and mobility (R26.89);Unsteadiness on feet (R26.81);Muscle weakness (generalized) (M62.81)   Activity Tolerance Patient tolerated treatment well   Patient Left in chair;with call bell/phone within reach;with chair alarm set   Nurse Communication Mobility status        Time: 8995-8971 OT Time Calculation (min): 24 min  Charges: OT  General Charges $OT Visit: 1 Visit OT Treatments $Self Care/Home Management : 23-37 mins  Dick Laine, OTA Acute Rehabilitation Services  Office [phone]   Jeb LITTIE Laine 08/01/2024, 2:35 PM

## 2024-08-01 NOTE — Progress Notes (Signed)
 SLP Cancellation Note  Patient Details Name: EFFA YARROW MRN: [id number] DOB: 09-15-1946   Cancelled treatment:       Reason Eval/Treat Not Completed: Patient at procedure or test/unavailable  Unable to assess readiness for advanced solids and complete cognitive linguistic treatment, as pt is currently unavailable. Will continue efforts.   Kindred Reidinger B. Dory, MSP, CCC-SLP Speech Language Pathologist Office: [phone]  Dory Caprice Daring 08/01/2024, 1:41 PM

## 2024-08-02 LAB — CBC WITH DIFFERENTIAL/PLATELET
Abs Immature Granulocytes: 0.02 K/uL (ref 0.00–0.07)
Basophils Absolute: 0.1 K/uL (ref 0.0–0.1)
Basophils Relative: 1 %
Eosinophils Absolute: 0.2 K/uL (ref 0.0–0.5)
Eosinophils Relative: 3 %
HCT: 28.3 % — ABNORMAL LOW (ref 36.0–46.0)
Hemoglobin: 9 g/dL — ABNORMAL LOW (ref 12.0–15.0)
Immature Granulocytes: 0 %
Lymphocytes Relative: 27 %
Lymphs Abs: 1.9 K/uL (ref 0.7–4.0)
MCH: 24 pg — ABNORMAL LOW (ref 26.0–34.0)
MCHC: 31.8 g/dL (ref 30.0–36.0)
MCV: 75.5 fL — ABNORMAL LOW (ref 80.0–100.0)
Monocytes Absolute: 0.7 K/uL (ref 0.1–1.0)
Monocytes Relative: 10 %
Neutro Abs: 4.3 K/uL (ref 1.7–7.7)
Neutrophils Relative %: 59 %
Platelets: 220 K/uL (ref 150–400)
RBC: 3.75 MIL/uL — ABNORMAL LOW (ref 3.87–5.11)
RDW: 17 % — ABNORMAL HIGH (ref 11.5–15.5)
WBC: 7.2 K/uL (ref 4.0–10.5)
nRBC: 0 % (ref 0.0–0.2)

## 2024-08-02 LAB — COMPREHENSIVE METABOLIC PANEL WITH GFR
ALT: 12 U/L (ref 0–44)
AST: 32 U/L (ref 15–41)
Albumin: 3.1 g/dL — ABNORMAL LOW (ref 3.5–5.0)
Alkaline Phosphatase: 118 U/L (ref 38–126)
Anion gap: 13 (ref 5–15)
BUN: 11 mg/dL (ref 8–23)
CO2: 17 mmol/L — ABNORMAL LOW (ref 22–32)
Calcium: 10.3 mg/dL (ref 8.9–10.3)
Chloride: 110 mmol/L (ref 98–111)
Creatinine, Ser: 1.2 mg/dL — ABNORMAL HIGH (ref 0.44–1.00)
GFR, Estimated: 47 mL/min — ABNORMAL LOW (ref >60)
Glucose, Bld: 107 mg/dL — ABNORMAL HIGH (ref 70–99)
Potassium: 4.2 mmol/L (ref 3.5–5.1)
Sodium: 140 mmol/L (ref 135–145)
Total Bilirubin: 0.3 mg/dL (ref 0.0–1.2)
Total Protein: 6.4 g/dL — ABNORMAL LOW (ref 6.5–8.1)

## 2024-08-02 MED ORDER — ENSURE PLUS HIGH PROTEIN PO LIQD
237.0000 mL | Freq: Two times a day (BID) | ORAL | Status: DC
  Administered 2024-08-03 – 2024-08-09 (×6): 237 mL via ORAL

## 2024-08-02 NOTE — Progress Notes (Signed)
 Initial Nutrition Assessment  DOCUMENTATION CODES:   Obesity unspecified  INTERVENTION:  Magic cup TID with meals, each supplement provides 290 kcal and 9 grams of protein  Ensure Plus High Protein po BID, each supplement provides 350 kcal and 20 grams of protein.  Encouraged adequate intake of meals and supplements to meet calorie and protein needs  Monitor progress made with SLP and any diet advancements  NUTRITION DIAGNOSIS:   Increased nutrient needs related to other (see comment) (rehab participation) as evidenced by estimated needs.  GOAL:   Patient will meet greater than or equal to 90% of their needs  MONITOR:   PO intake, Supplement acceptance, Diet advancement, Weight trends  REASON FOR ASSESSMENT:   Consult Assessment of nutrition requirement/status  ASSESSMENT:   Pt with hx of HTN, HLD, bipolar disorder, anemia, EtOH use, and chronic pain. Recent admission 10/4-10/9 with acute L MCA CVA. Hospital course complicated by aphasia, dysarthria, and dysphagia. Admitted to CIR to work on functional mobility and weakness.  10/9 admitted to [facility]  Spoke with pt who was resting in bed. Pt presents with continued dysarthria but was able to answer most questions. Pt reports she does not like the pureed foods she has been receiving which is why she is not eating well. Discussed importance of following safe swallow recommendations and how important her therapy work with speech is. Discussed that speech will continue to work on swallow with her and advance her diet as she makes progress. Encouraged pt to continue hard work. Pt reports she will try to eat magic cups to help intake but is also agreeable to trying Ensure. Pt believes it will be easier for her to drink calories since she is not a fan of the pureed foods.  Very limited wt hx available in chart. Pt reports no recent wt changes but pt presents with moderate pitting edema in lower extremities which may be skewing current  recorded weight in chart. Will monitor weight trends as fluid retention improves.  Pt reports no mobility issues prior to stroke and reports she was up and walking the halls today with PT.  Encouraged pt to continue hard work with therapies and discussed continuing to monitor her intake as she progresses.  Medications: Ferrous sulfate 325 mg daily  Labs reviewed  NUTRITION - FOCUSED PHYSICAL EXAM:  Flowsheet Row Most Recent Value  Orbital Region No depletion  Upper Arm Region No depletion  Thoracic and Lumbar Region No depletion  Buccal Region No depletion  Temple Region No depletion  Clavicle Bone Region No depletion  Clavicle and Acromion Bone Region No depletion  Scapular Bone Region No depletion  Dorsal Hand No depletion  Patellar Region Unable to assess  Anterior Thigh Region Unable to assess  Posterior Calf Region Unable to assess  Edema (RD Assessment) Moderate  [BLE,  pitting]  Hair Reviewed  Eyes Reviewed  Mouth Reviewed  [missing teeth]  Skin Reviewed  Nails Reviewed    Diet Order:   Diet Order             DIET - DYS 1 Room service appropriate? Yes with Assist; Fluid consistency: Thin  Diet effective now                   EDUCATION NEEDS:   Education needs have been addressed  Skin:  Skin Assessment: Reviewed RN Assessment  Last BM:  10/9 type 7  Height:   Ht Readings from Last 1 Encounters:  08/01/24 5' 5 (1.651 m)  Weight:   Wt Readings from Last 1 Encounters:  08/01/24 98 kg    Ideal Body Weight:  56.8 kg  BMI:  Body mass index is 35.95 kg/m.  Estimated Nutritional Needs:   Kcal:  1500-1700  Protein:  65-85g  Fluid:  1.5-1.7L    Josette Glance, MS, RDN, LDN Clinical Dietitian I Please reach out via secure chat

## 2024-08-02 NOTE — Discharge Summary (Signed)
 Physician Discharge Summary  Patient ID: BARBARANN KELLY MRN: [id number] DOB/AGE: Oct 01, 1946 78 y.o.  Admit date: 08/01/2024 Discharge date: 08/10/2024  Discharge Diagnoses:  Principal Problem:   Left middle cerebral artery stroke [facility]) DVT prophylaxis Mood stabilization Dysphagia Hyperlipidemia AKI Class II obesity History of alcohol use Microcytic anemia Hypertension Constipation  Discharged Condition: Stable  Significant Diagnostic Studies: ECHOCARDIOGRAM COMPLETE Result Date: 07/29/2024    ECHOCARDIOGRAM REPORT   Patient Name:   Christie Meza Date of Exam: 07/29/2024 Medical Rec #:  [id number]       Height:       65.0 in Accession #:    [id number]      Weight:       233.7 lb Date of Birth:  1946-09-16       BSA:          2.113 m Patient Age:    78 years        BP:           107/46 mmHg Patient Gender: F               HR:           85 bpm. Exam Location:  Inpatient Procedure: 2D Echo and Intracardiac Opacification Agent (Both Spectral and Color            Flow Doppler were utilized during procedure). Indications:    Stroke  History:        Patient has no prior history of Echocardiogram examinations.  Sonographer:    Charmaine Gaskins Referring Phys: [id number] MOHAMMAD L GARBA IMPRESSIONS  1. 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 Left ventricular ejection fraction, by estimation, is >75%. The left ventricle has hyperdynamic function. The left ventricle has no regional wall motion abnormalities. There is mild concentric left ventricular hypertrophy. Left ventricular diastolic parameters are consistent with Grade I diastolic dysfunction (impaired relaxation).  2. Right ventricular systolic function is normal. The right ventricular size is normal. There is mildly elevated pulmonary artery systolic pressure. The estimated right ventricular systolic pressure is 36.4 mmHg.  3. The mitral valve is normal in  structure. Trivial mitral valve regurgitation. No evidence of mitral stenosis.  4. The aortic valve is normal in structure. Aortic valve regurgitation is not visualized. No aortic stenosis is present.  5. The inferior vena cava is normal in size with greater than 50% respiratory variability, suggesting right atrial pressure of 3 mmHg. Conclusion(s)/Recommendation(s): No intracardiac source of embolism detected on this transthoracic study. Consider a transesophageal echocardiogram to exclude cardiac source of embolism if clinically indicated. 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 Left ventricular ejection fraction, by estimation, is >75%. The left ventricle has hyperdynamic function.  The left ventricle has no regional wall motion abnormalities. The left ventricular internal cavity size was normal in size. There is mild concentric left ventricular hypertrophy. Left ventricular diastolic parameters are consistent with Grade I diastolic dysfunction (impaired relaxation). Right Ventricle: The right ventricular size is normal. No increase in right ventricular wall thickness. Right ventricular systolic function is normal. There is mildly elevated pulmonary artery systolic pressure. The tricuspid regurgitant velocity is 2.89  m/s, and with an assumed right atrial pressure of 3 mmHg, the estimated right ventricular systolic pressure is 36.4 mmHg. Left Atrium: Left atrial size was normal in size. Right Atrium: Right atrial size was normal in  size. Pericardium: There is no evidence of pericardial effusion. Mitral Valve: The mitral valve is normal in structure. Trivial mitral valve regurgitation. No evidence of mitral valve stenosis. Tricuspid Valve: The tricuspid valve is normal in structure. Tricuspid valve regurgitation is trivial. No evidence of tricuspid stenosis. Aortic Valve: The aortic valve is normal  in structure. Aortic valve regurgitation is not visualized. No aortic stenosis is present. Pulmonic Valve: The pulmonic valve was normal in structure. Pulmonic valve regurgitation is not visualized. No evidence of pulmonic stenosis. Aorta: The aortic root is normal in size and structure. Venous: The inferior vena cava is normal in size with greater than 50% respiratory variability, suggesting right atrial pressure of 3 mmHg. IAS/Shunts: No atrial level shunt detected by color flow Doppler.  LEFT VENTRICLE PLAX 2D LVIDd:         3.90 cm   Diastology LVIDs:         1.60 cm   LV e' medial:    0.05 cm/s LV PW:         1.20 cm   LV E/e' medial:  14.1 LV IVS:        1.30 cm   LV e' lateral:   0.07 cm/s LVOT diam:     2.10 cm   LV E/e' lateral: 10.5 LVOT Area:     3.46 cm  RIGHT VENTRICLE RV Basal diam:  2.90 cm RV Mid diam:    2.50 cm RV S prime:     15.60 cm/s LEFT ATRIUM             Index        RIGHT ATRIUM           Index LA diam:        2.70 cm 1.28 cm/m   RA Area:     12.40 cm LA Vol (A2C):   51.7 ml 24.46 ml/m  RA Volume:   26.80 ml  12.68 ml/m LA Vol (A4C):   40.9 ml 19.35 ml/m LA Biplane Vol: 46.3 ml 21.91 ml/m   AORTA Ao Root diam: 2.90 cm Ao Asc diam:  3.40 cm MITRAL VALVE                TRICUSPID VALVE MV Area (PHT): 7.59 cm     TR Peak grad:   33.4 mmHg MV Decel Time: 100 msec     TR Vmax:        289.00 cm/s MV E velocity: 0.72 cm/s MV A velocity: 101.00 cm/s  SHUNTS MV E/A ratio:  0.01         Systemic Diam: 2.10 cm Oneil Parchment MD Electronically signed by Oneil Parchment MD Signature Date/Time: 07/29/2024/1:48:21 PM    Final    MR Angio Head WO CONTRAST Addendum Date: 07/28/2024 ADDENDUM REPORT: 07/28/2024 10:46 ADDENDUM: Impression #2 called by telephone at the time of interpretation on 07/28/2024 at 10:43 am to provider Dr. Rojelio, who verbally acknowledged these results. Electronically Signed   By: Rockey Childs D.O.   On: 07/28/2024 10:46   Result Date: 07/28/2024 CLINICAL DATA:  Provided history:  Neuro deficit, acute, stroke suspected. EXAM: MRA HEAD WITHOUT CONTRAST TECHNIQUE: Angiographic images of the Circle of Willis were acquired using MRA technique without intravenous contrast. COMPARISON:  Same-day brain MRI 07/28/2024. Non-contrast head CT and CT angiogram head/neck 07/27/2024. FINDINGS: Anterior circulation: The intracranial internal carotid arteries are patent. Mild atherosclerotic irregularity of both vessels. The left middle cerebral artery M1 segment is occluded at its mid-to-distal aspect. There  is some flow related signal within M2 and more distal left MCA branches, likely due to collateral flow. The right middle cerebral artery M1 segment is patent. No right M2 proximal branch occlusion or high-grade proximal stenosis is identified. The anterior cerebral arteries are patent. 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 Posterior circulation: The proximal intracranial vertebral arteries, and proximal right posterior inferior cerebellar artery, are excluded from the field of view. The visible intracranial vertebral arteries are patent. The basilar artery is patent. The posterior cerebral arteries are patent. Redemonstrated moderate stenosis within the right posterior cerebral artery P2 segment. A right posterior communicating artery is present. The left posterior communicating artery is diminutive or absent. Anatomic variants: As described. Attempts are being made to reach the ordering provider at this time. IMPRESSION: 1. The proximal intracranial vertebral arteries, and proximal right posterior inferior cerebellar artery (PICA), are excluded from the field of view. Within this limitation, findings are as follows. 2. The left middle cerebral artery M1 segment is occluded at its mid-to-distal aspect. 3. Redemonstrated moderate stenosis within the right posterior cerebral artery P2 segment. 4. 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 Electronically Signed: By: Rockey Childs D.O. On: 07/28/2024 10:15   EEG adult Result Date: 07/28/2024 Shelton Arlin KIDD, MD     07/28/2024 10:43 AM Patient Name: KAELY HOLLAN MRN: [id number] Epilepsy Attending: Arlin KIDD Shelton Referring Physician/Provider: Rosemarie Eather RAMAN, MD Date: 07/28/2024 Duration: 22.30 mins Patient history: 78 y.o. female with hx of bipolar 1 diorder, HTN and obesity, who presents to the ED from home via EMS as a Code Stroke, after family noted that she suddenly could not talk.  EEG to evaluate for seizure Level of alertness: Awake, asleep AEDs during EEG study: None Technical aspects: This EEG study was done with scalp electrodes positioned according to the 10-20 International system of electrode placement. Electrical activity was reviewed with band pass filter of 1-70Hz , sensitivity of 7 uV/mm, display speed of 106mm/sec with a 60Hz  notched filter applied as appropriate. EEG data were recorded continuously and digitally stored.  Video monitoring was available and reviewed as appropriate. Description: The posterior dominant rhythm consists of 8-9 Hz activity of moderate voltage (25-35 uV) seen predominantly in posterior head regions, symmetric and reactive to eye opening and eye closing. Sleep was characterized by vertex waves, sleep spindles (12 to 14 Hz), maximal frontocentral region.  There is continuous sharply contoured 3 to 6 Hz theta-delta slowing in left fronto-temporo-parietal region. Hyperventilation and photic stimulation were not performed.   ABNORMALITY - Continuous slow, left fronto-temporo-parietal region IMPRESSION: This study is suggestive of cortical dysfunction arising from left fronto-temporo-parietal region likely secondary to underlying structural abnormality. No seizures or definite epileptiform discharges were seen throughout the recording. If suspicion for interictal activity remains a concern, a prolonged study including sleep can  be considered. Arlin KIDD Shelton   MR Brain Wo Contrast (neuro protocol) Result Date: 07/28/2024 EXAM: MRI BRAIN WITHOUT CONTRAST 07/28/2024 04:55:37 AM TECHNIQUE: Multiplanar multisequence MRI of the head/brain was performed without the administration of intravenous contrast. BLADE sequences were utilized to compensate for patient motion. The study is mildly degraded by patient motion. COMPARISON: CT of the head dated 07/27/2024 and MRI of the head dated 02/23/2022. CLINICAL HISTORY: Neuro deficit, acute, stroke suspected. Patient confused, not able to hold still. BLADE sequences utilized to compensate for motion. FINDINGS: BRAIN AND VENTRICLES: There is restricted diffusion within the cortex  of the left frontal, parietal and temporal lobes compatible with acute non-hemorrhagic MCA distribution infarction. There is subcortical edema with mild cerebral swelling but no significant mass effect or midline shift. There is mild-to-moderate subcortical cerebral white matter disease. No intracranial hemorrhage. No mass. No midline shift. No hydrocephalus. The sella is unremarkable. Normal flow voids. ORBITS: No acute abnormality. SINUSES AND MASTOIDS: No acute abnormality. BONES AND SOFT TISSUES: Normal marrow signal. No acute soft tissue abnormality. IMPRESSION: 1. Acute non-hemorrhagic left MCA territory infarction involving the frontal, parietal, and temporal lobes with associated subcortical edema and mild cerebral swelling. No significant mass effect or midline shift. 2. Mild-to-moderate subcortical white matter disease. 3. Mild motion-related image degradation. Electronically signed by: Evalene Coho MD 07/28/2024 05:06 AM EDT RP Workstation: [id number]   CT ANGIO HEAD NECK W WO CM (CODE STROKE) Addendum Date: 07/27/2024 ADDENDUM REPORT: 07/27/2024 16:34 ADDENDUM: Due to motion on the initial acquisition, a repeat scan was performed. Unchanged appearance of the M1 and more distal left middle cerebral artery  vessels. 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 Beyond this, there is apparent occlusion of multiple left middle cerebral artery branches (beginning at the proximal M2 segment level). These results were called by telephone at the time of interpretation on 07/27/2024 at 4:25 pm to provider ERIC [facility] , who verbally acknowledged these results. The previously described stenosis of the paraclinoid left ICA appears moderate-to-severe on the most recent CTA acquisition. The previously described stenosis within the right posterior cerebral artery P2 segment appears severe on the most recent CTA acquisition. Electronically Signed   By: Rockey Childs D.O.   On: 07/27/2024 16:34   Result Date: 07/27/2024 CLINICAL DATA:  Provided history: Neuro deficit, acute, stroke suspected. EXAM: CT ANGIOGRAPHY HEAD AND NECK WITH AND WITHOUT CONTRAST TECHNIQUE: Multidetector CT imaging of the head and neck was performed using the standard protocol during bolus administration of intravenous contrast. Multiplanar CT image reconstructions and MIPs were obtained to evaluate the vascular anatomy. Carotid stenosis measurements (when applicable) are obtained utilizing NASCET criteria, using the distal internal carotid diameter as the denominator. RADIATION DOSE REDUCTION: This exam was performed according to the departmental dose-optimization program which includes automated exposure control, adjustment of the mA and/or kV according to patient size and/or use of iterative reconstruction technique. CONTRAST:  Administered contrast not known at this time. COMPARISON:  Noncontrast head CT performed earlier today 07/27/2024. FINDINGS: CTA NECK FINDINGS Aortic arch: Standard aortic branching. Minimal atherosclerotic plaque within the aortic arch. Streak/beam hardening artifact arising from a dense contrast bolus partially obscures the left subclavian artery. Within this limitation,  there is no appreciable hemodynamically significant innominate or proximal subclavian artery stenosis. Right carotid system: CCA and ICA patent within the neck without stenosis or significant atherosclerotic disease. Partially retropharyngeal course of the cervical ICA. Left carotid system: CCA and ICA patent within the neck without stenosis. Minimal atherosclerotic plaque about the carotid bifurcation. Vertebral arteries: Venous reflux of contrast partially obscures the left vertebral artery proximal V1 segment. Within this limitation, the vertebral arteries are codominant and patent within the neck without stenosis or significant aspect disease. Skeleton: Prior C3-C5 ACDF. Cervical and thoracic spondylosis. No acute fracture or aggressive osseous lesion. Other neck: Multiple thyroid  nodules (the largest an exophytic nodule projecting inferiorly from the lower pole of the right thyroid  lobe measuring 3 cm). This nodule extends into the superior mediastinum. Upper chest: No consolidation within the imaged lung apices. Review of  the MIP images confirms the above findings CTA HEAD FINDINGS Moderately motion degraded examination. Within this limitation, findings are as follows. Anterior circulation: The intracranial internal carotid arteries are patent. Atherosclerotic plaque within both vessels. Most notably, there is up to moderate stenosis of the paraclinoid ICAs, bilaterally. Occlusion versus severe near-occlusive stenosis within the left middle cerebral artery mid-to-distal M1 segment (for instance as seen on series 10, image 19). Beyond this, multiple left MCA branches are poorly delineated (beginning at the proximal M2 segment level) and are likely occluded. The right middle cerebral artery M1 segment is patent. No right M2 proximal branch occlusion or high-grade proximal stenosis is identified. The anterior cerebral arteries are patent. No intracranial aneurysm is identified. Posterior circulation: The  intracranial vertebral arteries are patent. The basilar artery is patent. The posterior cerebral arteries are patent. Moderate stenosis within the right posterior cerebral artery P2 segment. Posterior communicating arteries are diminutive or absent, bilaterally. Venous sinuses: Within the limitations of contrast timing, no convincing thrombus. Anatomic variants: As described. Review of the MIP images confirms the above findings CTA head impressiopn #2 called by telephone at the time of interpretation on 07/27/2024 at 3:35 pm to provider ERIC [facility] , who verbally acknowledged these results. IMPRESSION: CTA neck: 1. The common carotid and internal carotid arteries are patent within the neck without stenosis. Minimal atherosclerotic plaque on the left. 2. Venous reflux of contrast partially obscures the left vertebral artery proximal V1 segment. Within this limitation, the vertebral arteries are patent within the neck without stenosis or significant atherosclerotic disease. 3. Aortic Atherosclerosis (ICD10-I70.0). 4. 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 A nonemergent thyroid  ultrasound is recommended for further evaluation. Reference: J Am Coll Radiol. 2015 Feb;12(2): 143-50. CTA head: 1. Moderately motion degraded examination. Within this limitation, findings are as follows. 2. Occlusion versus severe-near occlusive stenosis of the left middle cerebral artery mid-to-distal M1 segment. Beyond this, multiple left MCA branches are also poorly delineated (beginning at the proximal M2 segment level) and are likely occluded. 3. Background intracranial atherosclerotic disease as described within the body of the report. Up to moderate stenosis of the paraclinoid internal carotid arteries, bilaterally. Moderate stenosis within the right posterior cerebral artery P2 segment. Electronically Signed: By: Rockey Childs D.O. On: 07/27/2024 15:51   CT HEAD CODE STROKE WO CONTRAST Result Date: 07/27/2024 CLINICAL DATA:   Code stroke. Provided history: Neuro deficit, acute, stroke suspected. EXAM: CT HEAD WITHOUT CONTRAST TECHNIQUE: Contiguous axial images were obtained from the base of the skull through the vertex without intravenous contrast. RADIATION DOSE REDUCTION: This exam was performed according to the departmental dose-optimization program which includes automated exposure control, adjustment of the mA and/or kV according to patient size and/or use of iterative reconstruction technique. COMPARISON:  Brain MRI 02/23/2022.  Head CT 02/23/2022. FINDINGS: Brain: Generalized cerebral atrophy. A cortical/subcortical infarct within the left parietal lobe has progressed in extent as compared to the brain MRI of 02/23/2022. Small age-indeterminate cortical infarct within the mid to posterior left frontal lobe, new from the prior MRI (series 5, image 37) (series 2, image 23). Background mild patchy and ill-defined hypoattenuation within the cerebral white matter, nonspecific but compatible with chronic small vessel image disease. There is no acute intracranial hemorrhage. No extra-axial fluid collection. No evidence of an intracranial mass. No midline shift. Vascular: No hyperdense vessel.  Atherosclerotic calcifications. Skull: No calvarial fracture or aggressive osseous lesion. Sinuses/Orbits: No mass or acute finding within the imaged orbits. Trace consult thickening within the  bilateral ethmoid sinuses. ASPECTS [facility] Stroke Program Early CT Score) - Ganglionic level infarction (caudate, lentiform nuclei, internal capsule, insula, M1-M3 cortex): 6 - Supraganglionic infarction (M4-M6 cortex): 1 Total score (0-10 with 10 being normal): 7 Age-indeterminate infarcts within the left frontal and left parietal lobes. These results were communicated to Dr. Lindzen at 3:19 pmon 10/4/2025by text page via the [facility] messaging system. IMPRESSION: 1. Small age-indeterminate cortical infarct within the mid-to-posterior left frontal lobe, new  from the prior brain MRI of 02/23/2022. Additionally, a left parietal lobe cortical/subcortical infarct has increased in extent raising the possibility of an acute on chronic infarct at this site. Consider a brain MRI for further evaluation. ASPECTS is 7. 2. Background parenchymal atrophy and chronic small vessel ischemic disease. Electronically Signed   By: Rockey Childs D.O.   On: 07/27/2024 15:20    Labs:  Basic Metabolic Panel: Recent Labs  Lab 08/02/24 0614 08/03/24 0642 08/06/24 0512  NA 140 140 141  K 4.2 3.6 3.5  CL 110 108 109  CO2 17* 20* 22  GLUCOSE 107* 106* 104*  BUN 11 11 9   CREATININE 1.20* 1.27* 1.12*  CALCIUM 10.3 10.7* 10.5*  MG  --   --  1.7  PHOS  --   --  3.0    CBC: Recent Labs  Lab 08/02/24 1051 08/06/24 0512  WBC 7.2 5.1  NEUTROABS 4.3 2.5  HGB 9.0* 7.9*  HCT 28.3* 24.5*  MCV 75.5* 74.5*  PLT 220 187    CBG: No results for input(s): GLUCAP in the last 168 hours.  Family history.  Mother with hypertension as well as kidney disease.  Denies any colon cancer or esophageal cancer or rectal cancer  Brief HPI:   KANIKA BUNGERT is a 78 y.o. right-handed female with history significant for hypertension, class II obesity with BMI 38.89 hyperlipidemia bipolar disorder maintained on Seroquel  as well as Cymbalta , chronic pain syndrome, microcytic anemia, history of alcohol use, ACDF 02/24/2022.  Per chart review lives with significant other.  1 level home.  Modified independent with a straight point cane.  Plan is for daughter and significant other to provide assistance on discharge.  Presented 07/27/2024 with acute onset of aphasia with right side facial droop and right side weakness.  Blood pressure 180/96.  CT/MRI showed acute nonhemorrhagic left MCA territory infarction involving the frontal, parietal and temporal lobes with associated subcortical edema and mild cerebral swelling.  No significant mass effect or midline shift.  Patient did not receive TNK.  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  EEG was done demonstrating continuous slow activity in the left frontal temporal parietal region without seizure.  Echocardiogram with ejection fraction of 75% grade 1 diastolic dysfunction no regional wall motion abnormalities.  Admission chemistries unremarkable except hemoglobin 8.1 creatinine 1.54.  Neurology follow-up recommendations to be maintained on Plavix 75 mg daily for CVA prophylaxis as aspirin  was not initiated due to allergy.  Plan for 30-day cardiac event monitor.  Lovenox  added for DVT prophylaxis.  Hospital course mild AKI baseline creatinine 1.2 peaking at 1.6 resolved with gentle IV fluids with latest creatinine 1.04.  Microcytic anemia hemoglobin 8-10 dipping to 7.2 during hospital course.  Anemia panel consistent with iron deficiency anemia.  Last colonoscopy from 2009 significant for hyperplastic polyp diverticulosis and hemorrhoids recommendations were made for iron supplement on discharge.  Currently on a dysphagia #1 thin liquid diet.  Therapy evaluations completed due to patient decreased functional mobility and right side weakness was admitted for a comprehensive rehab program.   Hospital Course: ABYGALE KARPF was admitted to rehab 08/01/2024 for inpatient therapies to consist of PT, ST and OT at least three hours five days a week. Past admission physiatrist, therapy team and rehab RN have worked together to provide customized collaborative inpatient rehab.  Pertaining to patient's left MCA infarction/left M1 occlusion.  Patient remained on Plavix alone due to history of aspirin  allergy.  Plan for 30-day cardiac event monitor and follow-up neurology services.  Lovenox  ongoing during hospital course for DVT prophylaxis no bleeding episodes.  Pain management with history of chronic back pain patient  on hydrocodone  5-325 mg 1 tablet every 4 hours as needed prior to admission.  Mood stabilization continued on Seroquel  and Cymbalta  as prior to admission patient attending full therapies with emotional support provided.  Noted dysphagia currently on a dysphagia #1 thin liquid diet and diet was advanced as per speech therapy.  Lipitor ongoing for hyperlipidemia.  History of AKI resolved baseline creatinine 1.2 monitoring of chemistries.  Class II obesity BMI 38.89 dietary follow-up.  History of alcohol use provided counts regards to cessation of alcohol products.  Microcytic anemia baseline hemoglobin 8-10 iron supplement as advised.  Blood pressure controlled and monitor on Cozaar as well as BiDil  and monitored with increased mobility and patient would need outpatient follow-up with PCP.  Bouts of constipation resolved with laxative assistance.   Blood pressures were monitored on TID basis and remained controlled and monitored     Rehab course: During patient's stay in rehab weekly team conferences were held to monitor patient's progress, set goals and discuss barriers to discharge. At admission, patient required minimal assist 8 feet rolling walker minimal assist step pivot transfers  He/She  has had improvement in activity tolerance, balance, postural control as well as ability to compensate for deficits. He/She has had improvement in functional use RUE/LUE  and RLE/LLE as well as improvement in awareness.  Working with energy conservation techniques.  Patient did need some encouragement at times to participate.  Ambulates 40 feet rolling walker contact-guard with cues to adjust right upper extremity grip on a rolling walker.  Demonstrates decreased safety with stand to sit allowing body to fall backwards into wheelchair providing education to patient and family.  Patient stands with contact-guard.  Completes functional strengthening and transfer training with close supervision.  During ADLs patient  focused on right hand coordination and right side awareness.  Requires moderate assist for bathing upper body mod assist lower body bathing mod assist upper body dressing mod assist lower body dressing minimal assist for toileting.  SLP follow-up for dysphagia and diet advanced as tolerated with education provided to family.  Full family teaching completed plan discharge to home.       Disposition:  Discharge disposition: 06-Home-Health Care Svc        Diet: Dysphagia #2 thin liquids  Special Instructions: No driving smoking or alcohol  Medications at discharge. 1.  Tylenol  as needed 2.  Pravachol  40 mg daily 3.  Plavix 75 mg p.o. daily 4.  Cymbalta  30 mg p.o. daily 5.  Fergon 324 mg daily 6.Bidil  20-37.5 mg 1 tablet p.o. 3 times daily 7.  Cozaar 25 mg p.o. daily 8.  Seroquel  50 mg p.o. daily 9.  Albuterol inhaler 1 puff every 6 hours as needed 10.  Neurontin  100 mg  p.o. 3 times daily 11.  Vitamin B12 1000 mcg p.o. daily 12.  Trazodone  100 mg p.o. nightly 13.  MiraLAX  daily hold for loose stools 14.  Protonix  40 mg daily 15.  Lasix 20 mg daily as needed edema 16.  Colace 100 mg twice daily  30-35 minutes were spent completing discharge summary and discharge planning  Discharge Instructions     Ambulatory referral to Neurology   Complete by: As directed    An appointment is requested in approximately: 4 weeks left MCA infarction   Ambulatory referral to Physical Medicine Rehab   Complete by: As directed    Moderate complexity follow-up 1 to 2 weeks left MCA infarction        Follow-up Information     Emeline Search C, DO Follow up.   Specialty: Physical Medicine and Rehabilitation Why: Office to call for appointment Contact information: [address] [place] [address] [phone]         Shelda Atlas, MD Follow up.   Specialty: Internal Medicine Why: Call for appointment Contact information: [address] [place]  [address] [phone]                 Signed: Toribio PARAS Fonda Rochon 08/09/2024, 3:38 AM

## 2024-08-02 NOTE — Progress Notes (Signed)
 Patient claims she is feeling dizzy. Vital signs WNL. Patient going to therapy and blood work done.

## 2024-08-02 NOTE — Progress Notes (Signed)
 Occupational Therapy Assessment and Plan  Patient Details  Name: Christie Meza MRN: [id number] Date of Birth: Mar 19, 1946  OT Diagnosis: apraxia, ataxia, hemiplegia affecting dominant side, and muscle weakness (generalized) Rehab Potential: Rehab Potential (ACUTE ONLY): Good ELOS: 7-10 days   Today's Date: 08/02/2024 OT Individual Time: 9169-9054 OT Individual Time Calculation (min): 75 min     Hospital Problem: Principal Problem:   Left middle cerebral artery stroke [facility])   Past Medical History:  Past Medical History:  Diagnosis Date   Bipolar 1 disorder (HCC)    Hypertension    Obesity    Past Surgical History:  Past Surgical History:  Procedure Laterality Date   ABDOMINAL HYSTERECTOMY  ~2002   ANTERIOR CERVICAL DECOMP/DISCECTOMY FUSION N/A 02/24/2022   Procedure: ANTERIOR CERVICAL THREE- FOUR AND CERVICAL FOUR THROUGH FIVE DECOMPRESSION/DISCECTOMY FUSION 2 LEVELS;  Surgeon: Colon Shove, MD;  Location: [facility] OR;  Service: Neurosurgery;  Laterality: N/A;    Assessment & Plan Clinical Impression: Christie Meza. 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 Per chart review patient lives with significant other. 1 level home. Modified independent with straight point cane. Plan is for daughter and significant other to provide assistance on discharge. Presented 07/27/2024 with acute onset of aphasia with right facial droop and right side weakness. Blood pressure 180/96. 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 No significant mass effect or midline shift. Patient did not receive TNK. 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 EEG was done demonstrating continuous slow activity in the left frontal temporal parietal region without seizure. Echocardiogram with ejection fraction 75% grade 1 diastolic dysfunction no regional wall motion abnormalities. Admission chemistries unremarkable except hemoglobin 8.1, creatinine 1.54. Neurology follow-up recommendations to be maintained on Plavix 75 mg daily for CVA prophylaxis as aspirin  was not initiated due to allergy. Plan for 30-day cardiac event monitor. Lovenox  added for DVT prophylaxis. Hospital course mild AKI baseline creatinine 1.2 peaking at 1.60 resolved with gentle IV fluids with latest creatinine 1.04. Microcytic anemia baseline hemoglobin 8-10 dipping to 7.2 during hospital course. Anemia panel consistent with iron deficiency anemia. Last colonoscopy from 2009 significant for hyperplastic polyps diverticulosis and hemorrhoids and recommendations were made for iron supplementation on discharge.. Currently on dysphagia #1 thin liquid diet. Therapy evaluations completed due to patient decreased functional mobility and right sided weakness was admitted for a comprehensive rehab program.  Patient transferred to CIR on 08/01/2024 .    Patient currently requires min with basic self-care skills secondary to muscle weakness, decreased cardiorespiratoy endurance, motor apraxia, decreased coordination, and decreased motor planning, decreased attention to right, decreased safety awareness, and decreased standing balance, decreased postural control, hemiplegia, and decreased balance strategies.  Prior to hospitalization, patient could complete ADLs with modified independent .  Patient will benefit from skilled intervention to increase independence with basic self-care skills prior to discharge home with care partner.  Anticipate patient will require  24 hour supervision and follow up outpatient.  OT - End of Session Activity Tolerance: Tolerates 10 - 20 min activity with multiple rests Endurance Deficit: Yes Endurance Deficit Description: generalized  deconditioning OT Assessment Rehab Potential (ACUTE ONLY): Good OT Barriers to Discharge: Other (comments) (unclear if pt is reliable historian to determine help at home) OT Patient demonstrates impairments in the following area(s): Balance OT Basic ADL's Functional Problem(s): Toileting;Dressing;Bathing;Grooming;Eating OT Advanced ADL's Functional Problem(s): None OT Transfers Functional Problem(s): Toilet;Tub/Shower OT Additional Impairment(s): Fuctional Use of Upper Extremity OT Plan OT Intensity: Minimum of 1-2 x/day, 45 to 90 minutes OT Frequency: 5 out of 7 days OT Duration/Estimated Length of Stay: 7-10 days OT Treatment/Interventions: Balance/vestibular training;Discharge planning;Pain management;Self Care/advanced ADL retraining;Therapeutic Activities;UE/LE Coordination activities;Visual/perceptual remediation/compensation;Therapeutic Exercise;Functional mobility training;Patient/family education;Skin care/wound managment;Cognitive remediation/compensation;Community reintegration;DME/adaptive equipment instruction;Neuromuscular re-education;Psychosocial support;UE/LE Strength taining/ROM;Wheelchair propulsion/positioning;Splinting/orthotics OT Self Feeding Anticipated Outcome(s): (S) OT Basic Self-Care Anticipated Outcome(s): (S) OT Toileting Anticipated Outcome(s): (S) OT Bathroom Transfers Anticipated Outcome(s): (S) OT Recommendation Recommendations for Other Services: None Patient destination: Home Follow Up Recommendations: Outpatient OT Equipment Recommended: To be determined   OT Evaluation Precautions/Restrictions  Restrictions Weight Bearing Restrictions Per Provider Order: No General Chart Reviewed: Yes Family/Caregiver Present: No Vital Signs Therapy  Vitals Temp: 97.8 F (36.6 C) Temp Source: Oral Pulse Rate: 87 Resp: 16 BP: 135/75 Patient Position (if appropriate): Sitting Oxygen Therapy SpO2: 99 % O2 Device: Room Air Pain Pain Assessment Pain Scale: 0-10 Pain Score: 0-No pain Home Living/Prior Functioning Home Living Family/patient expects to be discharged to:: Private residence Living Arrangements: Spouse/significant other Available Help at Discharge: Family Type of Home: House Home Access: Level entry Home Layout: One level Bathroom Shower/Tub: Tub/shower unit Additional Comments: Unclear if hx is correct- pt stated home is 4 levels and chart previously stated single level home. Shower info is contradictory as well.  Lives With: Spouse IADL History Homemaking Responsibilities: Yes Meal Prep Responsibility: Primary Laundry Responsibility: Primary Cleaning Responsibility: Primary Bill Paying/Finance Responsibility: Primary Current License: No Leisure and Hobbies: TV, cookouts, Prior Function Level of Independence: Requires assistive device for independence, Independent with gait, Independent with basic ADLs Vocation: Retired Administrator, sports Baseline Vision/History: 0 No visual deficits Ability to See in Adequate Light: 0 Adequate Patient Visual Report: No change from baseline Vision Assessment?: Yes Eye Alignment: Within Functional Limits Ocular Range of Motion: Within Functional Limits Alignment/Gaze Preference: Within Defined Limits Tracking/Visual Pursuits: Decreased smoothness of vertical tracking;Decreased smoothness of horizontal tracking Saccades: Additional eye shifts occurred during testing;Decreased speed of saccadic movement Visual Fields: No apparent deficits Perception  Perception: Impaired Praxis Praxis: Impaired Praxis Impairment Details: Motor planning;Limb apraxia Cognition Cognition Overall Cognitive Status: Impaired/Different from baseline Arousal/Alertness: Awake/alert Orientation Level:  Person;Place;Situation Person: Oriented Place: Oriented Situation: Oriented Memory: Impaired Memory Impairment: Decreased recall of new information Attention: Selective Selective Attention: Impaired Selective Attention Impairment: Verbal basic;Functional basic Awareness: Impaired Awareness Impairment: Intellectual impairment Problem Solving: Impaired Problem Solving Impairment: Verbal basic;Functional basic Safety/Judgment: Impaired Comments: Poor awareness of deficits Brief Interview for Mental Status (BIMS) Repetition of Three Words (First Attempt): 3 Temporal Orientation: Year: Correct Temporal Orientation: Month: Accurate within 5 days Temporal Orientation: Day: Correct Recall: Sock: No, could not recall Recall: Blue: Yes, after cueing (a color) Recall: Bed: No, could not recall BIMS Summary Score: 10 Sensation Sensation Light Touch: Impaired Detail Central sensation comments: Sensory testing impaired but not absent- poor localization of stimuli, very poor proprioception. Light Touch Impaired Details: Impaired RUE;Impaired RLE Proprioception: Impaired Detail Proprioception Impaired Details: Impaired RUE;Impaired RLE Stereognosis: Impaired Detail Stereognosis Impaired Details: Impaired RUE Coordination Gross Motor Movements are Fluid and Coordinated: No Fine Motor Movements are Fluid and Coordinated: No Coordination and Movement Description: R hemi- mostly  proprioceptive and ataxia vs strength Motor  Motor Motor: Motor apraxia;Ataxia;Hemiplegia Motor - Skilled Clinical Observations: R hemi- apraxia and ataxia  Trunk/Postural Assessment  Cervical Assessment Cervical Assessment: Within Functional Limits Thoracic Assessment Thoracic Assessment: Within Functional Limits Lumbar Assessment Lumbar Assessment: Within Functional Limits Postural Control Postural Control: Deficits on evaluation Righting Reactions: delayed and inadequate  Balance Balance Balance  Assessed: Yes Static Sitting Balance Static Sitting - Balance Support: Feet supported Static Sitting - Level of Assistance: 6: Modified independent (Device/Increase time) Dynamic Sitting Balance Dynamic Sitting - Balance Support: Feet supported Dynamic Sitting - Level of Assistance: 5: Stand by assistance Static Standing Balance Static Standing - Balance Support: During functional activity;Bilateral upper extremity supported Static Standing - Level of Assistance: 4: Min assist Dynamic Standing Balance Dynamic Standing - Balance Support: During functional activity;Bilateral upper extremity supported Dynamic Standing - Level of Assistance: 4: Min assist Extremity/Trunk Assessment RUE Assessment RUE Assessment: Exceptions to [facility] General Strength Comments: Strength mostly intact and likely at baseline compared to LUE. Neuro deficits include apraxia, ataxia, and proprioceptive deficits RUE Body System: Neuro Brunstrum levels for arm and hand: Arm;Hand Brunstrum level for arm: Stage V Relative Independence from Synergy Brunstrum level for hand: Stage VI Isolated joint movements RUE Strength Right Hand Grip (lbs): 41 LUE Assessment LUE Assessment: Within Functional Limits General Strength Comments: Generalized weakness, 4-5, likely baseline LUE Strength Left Hand Grip (lbs): 39  Care Tool Care Tool Self Care Eating   Eating Assist Level: Minimal Assistance - Patient > 75%    Oral Care    Oral Care Assist Level: Minimal Assistance - Patient > 75%    Bathing   Body parts bathed by patient: Right arm;Left arm;Chest;Abdomen;Front perineal area;Right upper leg;Buttocks;Face;Left lower leg;Right lower leg;Left upper leg     Assist Level: Moderate Assistance - Patient 50 - 74%    Upper Body Dressing(including orthotics)   What is the patient wearing?: Pull over shirt   Assist Level: Moderate Assistance - Patient 50 - 74%    Lower Body Dressing (excluding footwear)   What is the  patient wearing?: Underwear/pull up;Pants Assist for lower body dressing: Moderate Assistance - Patient 50 - 74%    Putting on/Taking off footwear   What is the patient wearing?: Non-skid slipper socks Assist for footwear: Dependent - Patient 0%       Care Tool Toileting Toileting activity   Assist for toileting: Moderate Assistance - Patient 50 - 74%     Care Tool Bed Mobility Roll left and right activity   Roll left and right assist level: Contact Guard/Touching assist    Sit to lying activity   Sit to lying assist level: Minimal Assistance - Patient > 75%    Lying to sitting on side of bed activity   Lying to sitting on side of bed assist level: the ability to move from lying on the back to sitting on the side of the bed with no back support.: Minimal Assistance - Patient > 75%     Care Tool Transfers Sit to stand transfer   Sit to stand assist level: Minimal Assistance - Patient > 75%    Chair/bed transfer   Chair/bed transfer assist level: Minimal Assistance - Patient > 75%     Toilet transfer   Assist Level: Minimal Assistance - Patient > 75%     Care Tool Cognition  Expression of Ideas and Wants Expression of Ideas and Wants: 3. Some difficulty - exhibits some difficulty with expressing needs and ideas (  e.g, some words or finishing thoughts) or speech is not clear  Understanding Verbal and Non-Verbal Content Understanding Verbal and Non-Verbal Content: 3. Usually understands - understands most conversations, but misses some part/intent of message. Requires cues at times to understand   Memory/Recall Ability Memory/Recall Ability : Current season;That he or she is in a hospital/hospital unit   Refer to Care Plan for Long Term Goals  SHORT TERM GOAL WEEK 1 OT Short Term Goal 1 (Week 1): STG= LTG d/t ELOS  Recommendations for other services: None    Skilled Therapeutic Intervention ADL ADL Eating: Minimal assistance Where Assessed-Eating: Edge of  bed Grooming: Minimal assistance Where Assessed-Grooming: Sitting at sink Upper Body Bathing: Moderate assistance Where Assessed-Upper Body Bathing: Sitting at sink Lower Body Bathing: Moderate assistance Where Assessed-Lower Body Bathing: Sitting at sink Upper Body Dressing: Moderate assistance Where Assessed-Upper Body Dressing: Sitting at sink Lower Body Dressing: Moderate assistance Where Assessed-Lower Body Dressing: Sitting at sink Toileting: Minimal assistance Where Assessed-Toileting: Teacher, adult education: Minimal Dentist Method: Stand pivot Tub/Shower Transfer: Unable to assess Mobility  Bed Mobility Bed Mobility: Supine to Sit;Sit to Supine Supine to Sit: Supervision/Verbal cueing Sit to Supine: Supervision/Verbal cueing Transfers Sit to Stand: Minimal Assistance - Patient > 75% Stand to Sit: Minimal Assistance - Patient > 75%   Skilled OT evaluation completed with the creation of pt centered OT POC. Pt educated on condition, ELOS, rehab expectations, and fall risk reduction strategies throughout session. ADLs completed sink side as described above. Pt limited by R apraxia and ataxia that both impact proprioception of the RUE especially. She has limited awareness of deficits and need for assist that makes her a high fall risk. Transfers at min A level with the RW overall. Toileting completed with min A. Pt was left sitting up in the wheelchair with all needs met, chair alarm set, and call bell within reach.    Box and Blocks Test measures unilateral gross manual dexterity. - Instructions The pt was instructed to carry one block over at a time and go as quickly as they could, making sure their fingertips crossed the partition. One minute was given to complete the task per UE. The pt was allowed a 15-second trial period prior to testing if needed. - Results The pt transferred 19 blocks with the R hand and 30 with the L hand. The total number of blocks  carried from one compartment to the other in one minute is scored per hand. Higher scores on the test indicate better gross manual dexterity.  - Norms for adults females 50-75+ 50-54 R 77.7 L 74.3 55-59 R 74.7 L 73.6 60-64 R 76.1 L 73.6 65-69 R 72 L 71.3 70-74 R 68.6 L 68.3 75+ R 65.0 L 63.6    Discharge Criteria: Patient will be discharged from OT if patient refuses treatment 3 consecutive times without medical reason, if treatment goals not met, if there is a change in medical status, if patient makes no progress towards goals or if patient is discharged from hospital.  The above assessment, treatment plan, treatment alternatives and goals were discussed and mutually agreed upon: by patient  Christie Meza 08/02/2024, 12:09 PM

## 2024-08-02 NOTE — Progress Notes (Signed)
 Met with patient to review current situation, team conference and plan of care. Reviewed medications DAPT stroke prophylaxis, hypertension , hyperlipidemia. Reviewed dietary modifications, follow up with MD. Reviewed bowel, bladder and pain. Continue to follow along to provide educational needs to facilitate preparation for discharge

## 2024-08-02 NOTE — Progress Notes (Signed)
 Inpatient Rehabilitation  Patient information reviewed and entered into eRehab system by [facility]. Karen Kays., CCC/SLP, PPS Coordinator.  Information including medical coding, functional ability and quality indicators will be reviewed and updated through discharge.

## 2024-08-02 NOTE — IPOC Note (Signed)
 Overall Plan of Care [facility]) Patient Details Name: Christie Meza MRN: [id number] DOB: 1946-03-10  Admitting Diagnosis: Left middle cerebral artery stroke [facility])  Hospital Problems: Principal Problem:   Left middle cerebral artery stroke [facility])     Functional Problem List: Nursing Bladder, Bowel, Edema, Endurance, Safety  PT Balance, Safety, Endurance, Motor  OT Balance  SLP Cognition, Linguistic, Motor, Nutrition, Safety  TR         Basic ADL's: OT Toileting, Dressing, Bathing, Grooming, Eating     Advanced  ADL's: OT None     Transfers: PT Bed Mobility, Bed to Chair, Car, Occupational psychologist, Research scientist (life sciences): PT Ambulation, Educational psychologist, Psychologist, prison and probation services     Additional Impairments: OT Fuctional Use of Upper Extremity  SLP Swallowing, Communication, Social Cognition comprehension, expression Attention, Awareness  TR      Anticipated Outcomes Item Anticipated Outcome  Self Feeding (S)  Swallowing  minA   Basic self-care  (S)  Toileting  (S)   Bathroom Transfers (S)  Bowel/Bladder  manage bowels with medications/ manage bladder with toileting assistance  Transfers  supervision  Locomotion  supervision w/ LRAD  Communication  minA  Cognition  minA  Pain  <4 w/ prns  Safety/Judgment  manage safety with supervision to min assistance   Therapy Plan: PT Intensity: Minimum of 1-2 x/day ,45 to 90 minutes PT Frequency: 5 out of 7 days PT Duration Estimated Length of Stay: 7-10 days OT Intensity: Minimum of 1-2 x/day, 45 to 90 minutes OT Frequency: 5 out of 7 days OT Duration/Estimated Length of Stay: 7-10 days SLP Intensity: Minumum of 1-2 x/day, 30 to 90 minutes SLP Frequency: 3 to 5 out of 7 days SLP Duration/Estimated Length of Stay: 7-10 days   Team Interventions: Nursing Interventions Patient/Family Education, Bladder Management, Bowel Management, Disease Management/Prevention, Pain Management, Medication Management, Discharge Planning,  Dysphagia/Aspiration Precaution Training  PT interventions Ambulation/gait training, Community reintegration, Neuromuscular re-education, Stair training, UE/LE Strength taining/ROM, Wheelchair propulsion/positioning, UE/LE Coordination activities, Therapeutic Activities, Discharge planning, Warden/ranger, Functional mobility training, Patient/family education, Therapeutic Exercise  OT Interventions Balance/vestibular training, Discharge planning, Pain management, Self Care/advanced ADL retraining, Therapeutic Activities, UE/LE Coordination activities, Visual/perceptual remediation/compensation, Therapeutic Exercise, Functional mobility training, Patient/family education, Skin care/wound managment, Cognitive remediation/compensation, Firefighter, Fish farm manager, Neuromuscular re-education, Psychosocial support, UE/LE Strength taining/ROM, Wheelchair propulsion/positioning, Splinting/orthotics  SLP Interventions Cognitive remediation/compensation, Cueing hierarchy, Functional tasks, Therapeutic Activities, Multimodal communication approach, Speech/Language facilitation, Internal/external aids, Dysphagia/aspiration precaution training, Patient/family education  TR Interventions    SW/CM Interventions Discharge Planning, Psychosocial Support, Patient/Family Education   Barriers to Discharge MD  Medical stability, Home enviroment access/loayout, Neurogenic bowel and bladder, Lack of/limited family support, Insurance for SNF coverage, Weight, and Nutritional means  Nursing Decreased caregiver support, Home environment access/layout, Incontinence Discharge Living Setting: Patient's home, Lives with (comment) (s/o [name])  Type of Home at Discharge: House  Discharge Home Layout: One level  Discharge Home Access: Level entry  PT Inaccessible home environment    OT Other (comments) (unclear if pt is reliable historian to determine help at home)    SLP      SW  Insurance for SNF coverage     Team Discharge Planning: Destination: PT-Home ,OT- Home , SLP-Home Projected Follow-up: PT-Home health PT, OT-  Outpatient OT, SLP-Outpatient SLP, 24 hour supervision/assistance Projected Equipment Needs: PT-To be determined, OT- To be determined, SLP-None recommended by SLP Equipment Details: PT- , OT-  Patient/family involved in discharge planning: PT- Patient,  OT-Patient, SLP-Patient unable/family or caregive not available  MD ELOS: 7-10 days Medical Rehab Prognosis:  Good Assessment: The patient has been admitted for CIR therapies with the diagnosis of CVA. The team will be addressing functional mobility, strength, stamina, balance, safety, adaptive techniques and equipment, self-care, bowel and bladder mgt, patient and caregiver education. Goals have been set at supervision PT, OT, Min A SLP. Anticipated discharge destination is home.       See Team Conference Notes for weekly updates to the plan of care

## 2024-08-02 NOTE — Evaluation (Signed)
 Physical Therapy Assessment and Plan  Patient Details  Name: Christie Meza MRN: [id number] Date of Birth: Mar 05, 1946  PT Diagnosis: Abnormal posture, Abnormality of gait, Coordination disorder, Hemiplegia dominant, and Muscle weakness Rehab Potential: Good ELOS: 7-10 days   Today's Date: 08/02/2024 PT Individual Time: 1045-1200 PT Individual Time Calculation (min): 75 min    Hospital Problem: Principal Problem:   Left middle cerebral artery stroke [facility])   Past Medical History:  Past Medical History:  Diagnosis Date   Bipolar 1 disorder (HCC)    Hypertension    Obesity    Past Surgical History:  Past Surgical History:  Procedure Laterality Date   ABDOMINAL HYSTERECTOMY  ~2002   ANTERIOR CERVICAL DECOMP/DISCECTOMY FUSION N/A 02/24/2022   Procedure: ANTERIOR CERVICAL THREE- FOUR AND CERVICAL FOUR THROUGH FIVE DECOMPRESSION/DISCECTOMY FUSION 2 LEVELS;  Surgeon: Colon Shove, MD;  Location: [facility] OR;  Service: Neurosurgery;  Laterality: N/A;    Assessment & Plan Clinical Impression: Christie Meza is a 78 year old right-handed female with history significant for essential hypertension, 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 Per chart review patient lives with significant other. 1 level home. Modified independent with straight point cane. Plan is for daughter and significant other to provide assistance on discharge. Presented 07/27/2024 with acute onset of aphasia with right facial droop and right side weakness. Blood pressure 180/96. 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 No significant mass effect or midline shift. Patient did not receive TNK. 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 EEG was done demonstrating continuous slow activity in the left frontal temporal parietal region without seizure. Echocardiogram with ejection fraction 75% grade 1 diastolic dysfunction no regional wall motion abnormalities. Admission chemistries unremarkable except hemoglobin 8.1, creatinine 1.54. Neurology follow-up recommendations to be maintained on Plavix 75 mg daily for CVA prophylaxis as aspirin  was not initiated due to allergy. Plan for 30-day cardiac event monitor. Lovenox  added for DVT prophylaxis. Hospital course mild AKI baseline creatinine 1.2 peaking at 1.60 resolved with gentle IV fluids with latest creatinine 1.04. Microcytic anemia baseline hemoglobin 8-10 dipping to 7.2 during hospital course. Anemia panel consistent with iron deficiency anemia. Last colonoscopy from 2009 significant for hyperplastic polyps diverticulosis and hemorrhoids and recommendations were made for iron supplementation on discharge.. Currently on dysphagia #1 thin liquid diet. Therapy evaluations completed due to patient decreased functional mobility and right sided weakness was admitted for a comprehensive rehab program.   Patient currently requires min with mobility secondary to muscle weakness and impaired timing and sequencing, decreased coordination, and decreased motor planning.  Prior to hospitalization, patient was modified independent  with mobility and lived with Spouse in a House home.  Home access is   (pt states at PT eval 6 STE.).  Patient will benefit from skilled PT intervention to maximize safe functional mobility, minimize fall risk, and decrease caregiver burden for planned discharge home with 24 hour supervision.  Anticipate patient will benefit from follow up HH at discharge.  PT - End of Session Activity Tolerance: Tolerates 10 - 20 min activity with multiple  rests Endurance Deficit: Yes Endurance Deficit Description: generalized deconditioning PT Assessment Rehab Potential (ACUTE/IP ONLY):  Good PT Barriers to Discharge: Inaccessible home environment PT Patient demonstrates impairments in the following area(s): Balance;Safety;Endurance;Motor PT Transfers Functional Problem(s): Bed Mobility;Bed to Chair;Car;Furniture PT Locomotion Functional Problem(s): Ambulation;Stairs;Wheelchair Mobility PT Plan PT Intensity: Minimum of 1-2 x/day ,45 to 90 minutes PT Frequency: 5 out of 7 days PT Duration Estimated Length of Stay: 7-10 days PT Treatment/Interventions: Ambulation/gait training;Community reintegration;Neuromuscular re-education;Stair training;UE/LE Strength taining/ROM;Wheelchair propulsion/positioning;UE/LE Coordination activities;Therapeutic Activities;Discharge planning;Balance/vestibular training;Functional mobility training;Patient/family education;Therapeutic Exercise PT Transfers Anticipated Outcome(s): supervision PT Locomotion Anticipated Outcome(s): supervision w/ LRAD PT Recommendation Follow Up Recommendations: Home health PT Patient destination: Home Equipment Recommended: To be determined   PT Evaluation Precautions/Restrictions Precautions Precautions: Fall (right inattention.) Precaution/Restrictions Comments: R inattention Restrictions Weight Bearing Restrictions Per Provider Order: No General Chart Reviewed: Yes Family/Caregiver Present: No Vital SignsTherapy Vitals Temp: 97.8 F (36.6 C) Temp Source: Oral Pulse Rate: 87 Resp: 16 BP: 135/75 Patient Position (if appropriate): Sitting Oxygen Therapy SpO2: 99 % O2 Device: Room Air Pain Pain Assessment Pain Scale: 0-10 Pain Score: 0-No pain Pain Interference Pain Interference Pain Effect on Sleep: 2. Occasionally Pain Interference with Therapy Activities: 1. Rarely or not at all Pain Interference with Day-to-Day Activities: 1. Rarely or not at all Home  Living/Prior Functioning Home Living Living Arrangements: Spouse/significant other Available Help at Discharge: Family Type of Home: House Home Access:  (pt states at PT eval 6 STE.) Home Layout: One level Bathroom Shower/Tub: Tub/shower unit Additional Comments: Unclear if hx is correct- pt stated home is 4 levels and chart previously stated single level home. Shower info is contradictory as well.  Lives With: Spouse Prior Function Level of Independence: Independent with transfers;Independent with gait;Requires assistive device for independence (SPC occasionally)  Able to Take Stairs?: Yes Vocation: Retired Vision/Perception  Vision - History Ability to See in Adequate Light: 0 Adequate Vision - Assessment Eye Alignment: Within Functional Limits Ocular Range of Motion: Within Functional Limits Alignment/Gaze Preference: Within Defined Limits Tracking/Visual Pursuits: Decreased smoothness of vertical tracking;Decreased smoothness of horizontal tracking Saccades: Additional eye shifts occurred during testing;Decreased speed of saccadic movement Perception Perception: Impaired Preception Impairment Details: Inattention/Neglect;Spatial orientation Praxis Praxis: Impaired Praxis Impairment Details: Motor planning;Limb apraxia  Cognition Overall Cognitive Status: Impaired/Different from baseline Arousal/Alertness: Awake/alert Attention: Selective Selective Attention: Impaired Selective Attention Impairment: Verbal basic;Functional basic Memory: Impaired Memory Impairment: Decreased recall of new information Awareness: Impaired Awareness Impairment: Intellectual impairment Problem Solving: Impaired Problem Solving Impairment: Verbal basic;Functional basic Safety/Judgment: Impaired Comments: Poor awareness of deficits Sensation Sensation Light Touch: Appears Intact (to LES per report.) Central sensation comments: Sensory testing impaired but not absent- poor localization of  stimuli, very poor proprioception. Light Touch Impaired Details: Impaired RUE;Impaired RLE Proprioception: Impaired Detail Proprioception Impaired Details: Impaired RUE;Impaired RLE Stereognosis: Impaired Detail Stereognosis Impaired Details: Impaired RUE Coordination Gross Motor Movements are Fluid and Coordinated: No Fine Motor Movements are Fluid and Coordinated: No Coordination and Movement Description: R hemi- mostly proprioceptive and ataxia vs strength Heel Shin Test: pt unable to perform although attempts. Motor  Motor Motor: Hemiplegia Motor - Skilled Clinical Observations: slight hemi   Trunk/Postural Assessment  Cervical Assessment Cervical Assessment: Within Functional Limits Thoracic Assessment Thoracic Assessment: Within Functional Limits Lumbar Assessment Lumbar Assessment: Within Functional Limits Postural Control Postural Control: Deficits on evaluation Righting Reactions: delayed.  Balance Balance Balance Assessed: Yes Static Sitting Balance Static Sitting - Balance Support: Feet supported Static Sitting - Level of Assistance: 6: Modified independent (Device/Increase time) Dynamic Sitting Balance Dynamic Sitting - Balance Support: Feet supported Dynamic Sitting -  Level of Assistance: 5: Stand by assistance Static Standing Balance Static Standing - Balance Support: During functional activity Static Standing - Level of Assistance: 4: Min assist Dynamic Standing Balance Dynamic Standing - Balance Support: During functional activity Dynamic Standing - Level of Assistance: 4: Min assist Extremity Assessment  RUE Assessment RUE Assessment: Exceptions to [facility] General Strength Comments: Strength mostly intact and likely at baseline compared to LUE. Neuro deficits include apraxia, ataxia, and proprioceptive deficits RUE Body System: Neuro Brunstrum levels for arm and hand: Arm;Hand Brunstrum level for arm: Stage V Relative Independence from Synergy Brunstrum  level for hand: Stage VI Isolated joint movements RUE Strength Right Hand Grip (lbs): 41 LUE Assessment LUE Assessment: Within Functional Limits General Strength Comments: Generalized weakness, 4-5, likely baseline LUE Strength Left Hand Grip (lbs): 39 RLE Assessment RLE Assessment: Exceptions to [facility] General Strength Comments: strength grossly 4+/5 LLE Assessment LLE Assessment: Within Functional Limits  Care Tool Care Tool Bed Mobility Roll left and right activity   Roll left and right assist level: Contact Guard/Touching assist    Sit to lying activity   Sit to lying assist level: Minimal Assistance - Patient > 75%    Lying to sitting on side of bed activity   Lying to sitting on side of bed assist level: the ability to move from lying on the back to sitting on the side of the bed with no back support.: Minimal Assistance - Patient > 75%     Care Tool Transfers Sit to stand transfer   Sit to stand assist level: Minimal Assistance - Patient > 75%    Chair/bed transfer   Chair/bed transfer assist level: Minimal Assistance - Patient > 75%    Car transfer Car transfer activity did not occur: Safety/medical concerns (c/o dizziness.)        Care Tool Locomotion Ambulation   Assist level: Minimal Assistance - Patient > 75% Assistive device: No Device Max distance: 40  Walk 10 feet activity   Assist level: Minimal Assistance - Patient > 75% Assistive device: No Device   Walk 50 feet with 2 turns activity Walk 50 feet with 2 turns activity did not occur: Safety/medical concerns      Walk 150 feet activity Walk 150 feet activity did not occur: Safety/medical concerns      Walk 10 feet on uneven surfaces activity Walk 10 feet on uneven surfaces activity did not occur: Safety/medical concerns      Stairs   Assist level: Moderate Assistance - Patient - 50 - 74% Stairs assistive device: 2 hand rails Max number of stairs: 4  Walk up/down 1 step activity   Walk up/down 1  step (curb) assist level: Moderate Assistance - Patient - 50 - 74% Walk up/down 1 step or curb assistive device: 2 hand rails  Walk up/down 4 steps activity   Walk up/down 4 steps assist level: Moderate Assistance - Patient - 50 - 74% Walk up/down 4 steps assistive device: 2 hand rails  Walk up/down 12 steps activity Walk up/down 12 steps activity did not occur: Refused      Pick up small objects from floor Pick up small object from the floor (from standing position) activity did not occur: Safety/medical concerns      Wheelchair Is the patient using a wheelchair?: Yes Type of Wheelchair: Manual   Wheelchair assist level: Dependent - Patient 0% Max wheelchair distance: 0 (unable to mobilize w/ RUE.)  Wheel 50 feet with 2 turns activity   Assist Level:  Dependent - Patient 0%  Wheel 150 feet activity   Assist Level: Dependent - Patient 0%    Refer to Care Plan for Long Term Goals  SHORT TERM GOAL WEEK 1 PT Short Term Goal 1 (Week 1): STG=LTG 2/2 ELOS  Recommendations for other services: None   Skilled Therapeutic Intervention Evaluation completed (see details above and below) with education on PT POC and goals and individual treatment initiated with focus on  balance, transfers, gait w/ LRAD, NMR, stairs.  Pt presents sitting in w/c and c/o lightheadedness w/ nursing present for BP, 135/75.  Pt states comes and goes, very vague about it.  Pt transfers all surfaces w/ min A and cues for hand placement.  Pt unable to mobilize w/c 2/2 RUE weakness/uncoordination.  Pt wheeled to main gym.  Pt transfers sit to stand w/ min A and amb w/o AD x 40' min A w/o LOB, cues for posture.  Pt transfers sit to supine on mat table w/ min A.  Total A to don TEDS from nursing.  Pt negotiated 4 steps w/ B rails and mod A, pt insisting on leading w/ hemiplegic R limbthat's what I always do and then reciprocal gait after.  Pt agreeable to step-to w/ descent.  Pt wheeled to small gym for attempted car  transfer, but pt c/o lightheadedness and returned to room.  BP at 115/69, HR 91.  Pt performed step-pivot w/c to bed w/o AD and min A.  Min A for bringing R LE onto bed.  Bed alarm on and all needs in reach.     Mobility Bed Mobility Bed Mobility: Supine to Sit;Sit to Supine;Rolling Right;Rolling Left Rolling Right: Contact Guard/Touching assist Rolling Left: Contact Guard/Touching assist Supine to Sit: Minimal Assistance - Patient > 75% Sit to Supine: Minimal Assistance - Patient > 75% Transfers Transfers: Sit to Stand;Stand Pivot Transfers;Stand to Sit Sit to Stand: Minimal Assistance - Patient > 75% Stand to Sit: Minimal Assistance - Patient > 75% Stand Pivot Transfers: Minimal Assistance - Patient > 75% Stand Pivot Transfer Details: Verbal cues for sequencing;Verbal cues for precautions/safety Transfer (Assistive device): None Locomotion  Gait Ambulation: Yes Gait Assistance: Minimal Assistance - Patient > 75% Gait Distance (Feet): 40 Feet Assistive device: None Gait Assistance Details: Verbal cues for precautions/safety Gait Assistance Details: cues for posture and visual scanning. Gait Gait: Yes Gait Pattern: Step-through pattern;Trunk flexed Gait velocity: decr Stairs / Additional Locomotion Stairs: Yes Stairs Assistance: Moderate Assistance - Patient 50 - 74% Stair Management Technique: Two rails (pt attempts reciprocal pattern w/ R foot leading, cued for step-to ascending w/ L LE but pt reverts to R foot 2/2 that's what I do!) Number of Stairs: 4 Height of Stairs: 6 Curb: Moderate Assistance - Patient 50 - 74% Wheelchair Mobility Wheelchair Mobility: Yes Wheelchair Assistance: Dependent - Patient 0% Wheelchair Parts Management: Needs assistance Distance: pt unable to mobilize w/ R hand.   Discharge Criteria: Patient will be discharged from PT if patient refuses treatment 3 consecutive times without medical reason, if treatment goals not met, if there is a  change in medical status, if patient makes no progress towards goals or if patient is discharged from hospital.  The above assessment, treatment plan, treatment alternatives and goals were discussed and mutually agreed upon: by patient  Skyla Champagne P Nieve Rojero 08/02/2024, 12:37 PM

## 2024-08-02 NOTE — Evaluation (Signed)
 Speech Language Pathology Assessment and Plan  Patient Details  Name: Christie Meza MRN: [id number] Date of Birth: 12-Jul-1946  SLP Diagnosis: Aphasia;Cognitive Impairments;Dysphagia;Dysarthria  Rehab Potential: Good ELOS: 7-10 days    Today's Date: 08/02/2024 SLP Individual Time: 8784-8684 SLP Individual Time Calculation (min): 60 min   Hospital Problem: Principal Problem:   Left middle cerebral artery stroke [facility])  Past Medical History:  Past Medical History:  Diagnosis Date   Bipolar 1 disorder (HCC)    Hypertension    Obesity    Past Surgical History:  Past Surgical History:  Procedure Laterality Date   ABDOMINAL HYSTERECTOMY  ~2002   ANTERIOR CERVICAL DECOMP/DISCECTOMY FUSION N/A 02/24/2022   Procedure: ANTERIOR CERVICAL THREE- FOUR AND CERVICAL FOUR THROUGH FIVE DECOMPRESSION/DISCECTOMY FUSION 2 LEVELS;  Surgeon: Colon Shove, MD;  Location: [facility] OR;  Service: Neurosurgery;  Laterality: N/A;    Assessment / Plan / Recommendation Clinical Impression  Pt is a 78 yo female with PMH Of HTN, bipolar, obesity, previous CVA, who presented to [facility] on 10/4 with inability to talk with associated right facial droop and right sided weakness. CT head revealed mid to posterior located left frontal lobe infarct as well as left parietal lobe involvement. MRI notable for acute nonhemorrhagic left MCA infarct involving frontal, parietal, and temporal lobes with subcortical edema and cerebral swelling. MRA noted occlusion of the left MCA/M1 segment with moderate stenosis in the right posterior cerebral artery p2 segment. 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 EEG showed continuous slow activity in the left frontal temporal parietal lobes. EF >75% with hyperdynamic function of LV and grade 1 diastolic dysfunction. Neurology recommended aspirin  and plavix for three months, then aspirin  alone, but pt with aspirin  allergy so will do plavix alone. Recommended 30-day heart  monitor at discharge. Hospital course microcytic anemia, permissive HTN. Pt is tolerating a D1/thin liquid diet. Therapy ongoing and recommendations are for CIR for multidisciplinary team approach for stroke rehab.   Cognitive-Linguistic Evaluation: Patient presents with moderate deficits in the areas of receptive/expressive language, motor speech, attention, safety and intellectual awareness, and orientation. Patient is broadly oriented to self and situation, though exhibits breakdowns in situational awareness when tasked with identifying new deficits s/p CVA. She is disoriented to time/date. Across bedside WAB subtests, patient exhibited mild to moderate deficits in the areas of fluency, repetition, comprehension, object naming, and Y/N accuracy, and moderate deficits during following directions task. Verbal output is characterized by intermittent halting secondary to word finding deficits as well as imprecise articulation. Volume, respiration, prosody, and resonance appear unimpaired. Due to language deficits, formal cognitive evaluation was not administered, however across functional tasks, noted limited insight to deficits, sustained attention, and safety awareness.    Bedside Swallow Evaluation: Patient presents with clinical swallowing functional positive for oral dysphagia with functional pharyngeal component as evidenced by limited overt s/sx of penetration/aspiration across all provided items. Oral mechanism exam was significant for right sided deficits including reduced facial and lingual ROM, labial strength, facial and lingual asymmetry, and lingual strength. SLP administered thin liquids, puree, and regular solids. With purees and thin liquids, no overt s/sx of penetration/aspiration nor difficulty noted. With regular solids, patient exhibited increased mastication time and initial oral residue which cleared when given extra time. At this time, recommend continuation of Dys1/thin liquid diet at  this time, though will plan to conduct meal with SLP at next availability to assess possibility of upgrade to Dys2 diet. Administer medications crushed in puree.  Patient would benefit from ongoing SLP services targeting all aforementioned deficits. Patient was left in lowered bed with call bell in reach and bed alarm set. SLP will continue to target goals per plan of care.    Skilled Therapeutic Interventions          SLP conducted skilled evaluation session to assess cognitive-linguistic and swallowing function. Utilized bedside WAB, bedside swallow evaluation and patient and/or family interview. Full results above.   SLP Assessment  Patient will need skilled Speech Lanaguage Pathology Services during CIR admission    Recommendations  SLP Diet Recommendations: Dysphagia 1 (Puree);Thin Liquid Administration via: Cup;Straw Medication Administration: Whole meds with puree Supervision: Patient able to self feed Compensations: Minimize environmental distractions;Small sips/bites;Slow rate Postural Changes and/or Swallow Maneuvers: Seated upright 90 degrees Oral Care Recommendations: Oral care BID Recommendations for Other Services: Neuropsych consult Patient destination: Home Follow up Recommendations: Outpatient SLP;24 hour supervision/assistance Equipment Recommended: None recommended by SLP    SLP Frequency 3 to 5 out of 7 days   SLP Duration  SLP Intensity  SLP Treatment/Interventions 7-10 days  Minumum of 1-2 x/day, 30 to 90 minutes  Cognitive remediation/compensation;Cueing hierarchy;Functional tasks;Therapeutic Activities;Multimodal communication approach;Speech/Language facilitation;Internal/external aids;Dysphagia/aspiration precaution training;Patient/family education    Pain Pain Assessment Pain Scale: Faces Pain Score: 4  Pain Location: Flank  Prior Functioning Cognitive/Linguistic Baseline: Baseline deficits Baseline deficit details: 2023 SLE, reports recall  deficits; depenedent on family for med management, other higher level tasks at baseline Type of Home: House  Lives With: Significant other Available Help at Discharge: Family Education: prior notes report 11th grade Vocation: Retired  SLP Evaluation Cognition Overall Cognitive Status: History of cognitive impairments - at baseline (Difficult to assess due to language impairments) Arousal/Alertness: Awake/alert Orientation Level: Oriented X4 Year: 2025 Month: February Day of Week: Incorrect Attention: Sustained;Selective Focused Attention: Appears intact Sustained Attention: Impaired Sustained Attention Impairment: Verbal basic;Functional basic Selective Attention: Impaired Selective Attention Impairment: Verbal basic;Functional basic Memory: Impaired Memory Impairment: Decreased recall of new information Awareness: Impaired Awareness Impairment: Intellectual impairment Problem Solving: Impaired Problem Solving Impairment: Verbal basic;Functional basic Safety/Judgment: Impaired Comments: Poor awareness of deficits  Comprehension Auditory Comprehension Overall Auditory Comprehension: Impaired Yes/No Questions: Impaired Basic Biographical Questions: 76-100% accurate Basic Immediate Environment Questions: 75-100% accurate Complex Questions: 25-49% accurate Other Yes/No Questions Comments`: Moderate Commands: Impaired One Step Basic Commands: 50-74% accurate Two Step Basic Commands: 0-24% accurate Complex Commands: 0-24% accurate Conversation: Simple Interfering Components: Attention EffectiveTechniques: Repetition;Increased volume;Pausing;Extra processing time;Slowed speech Visual Recognition/Discrimination Discrimination: Not tested Reading Comprehension Reading Status: Impaired Word level: Within functional limits Sentence Level: Impaired Paragraph Level: Not tested Interfering Components: Attention;Visual acuity Effective Techniques: Large  print Expression Expression Primary Mode of Expression: Verbal Verbal Expression Overall Verbal Expression: Impaired Initiation: Impaired Automatic Speech: Name;Social Response;Counting;Day of week;Month of year Level of Generative/Spontaneous Verbalization: Sentence Repetition: Impaired Level of Impairment: Sentence level Naming: Impairment Responsive: Not tested Confrontation: Impaired Convergent: Not tested Divergent: Not tested Verbal Errors: Semantic paraphasias;Phonemic paraphasias;Perseveration Pragmatics: No impairment Interfering Components: Attention Effective Techniques: Open ended questions;Semantic cues;Phonemic cues Non-Verbal Means of Communication: Not applicable Written Expression Dominant Hand: Right Written Expression: Exceptions to [facility] Self Formulation Ability: Word Oral Motor Oral Motor/Sensory Function Overall Oral Motor/Sensory Function: Mild impairment Facial ROM: Reduced right Facial Symmetry: Abnormal symmetry right Facial Strength: Reduced right Facial Sensation: Reduced right Lingual ROM: Reduced right Lingual Symmetry: Abnormal symmetry right Lingual Strength: Reduced Velum: Within Functional Limits Mandible: Within Functional Limits Motor Speech Overall Motor Speech: Impaired Respiration: Within functional limits Phonation:  Normal Resonance: Within functional limits Articulation: Impaired Level of Impairment: Phrase Intelligibility: Intelligibility reduced Word: 75-100% accurate Phrase: 75-100% accurate Sentence: 75-100% accurate Conversation: 50-74% accurate Motor Planning: Within functional limits Motor Speech Errors: Consistent Effective Techniques: Slow rate;Increased vocal intensity;Over-articulate  Care Tool Care Tool Cognition Ability to hear (with hearing aid or hearing appliances if normally used Ability to hear (with hearing aid or hearing appliances if normally used): 0. Adequate - no difficulty in normal conservation,  social interaction, listening to TV   Expression of Ideas and Wants Expression of Ideas and Wants: 3. Some difficulty - exhibits some difficulty with expressing needs and ideas (e.g, some words or finishing thoughts) or speech is not clear   Understanding Verbal and Non-Verbal Content Understanding Verbal and Non-Verbal Content: 3. Usually understands - understands most conversations, but misses some part/intent of message. Requires cues at times to understand  Memory/Recall Ability Memory/Recall Ability : Current season;That he or she is in a hospital/hospital unit   Intelligibility: Intelligibility reduced Word: 75-100% accurate Phrase: 75-100% accurate Sentence: 75-100% accurate Conversation: 50-74% accurate  Bedside Swallowing Assessment General Date of Onset: 07/27/24 Previous Swallow Assessment: BSE 07/28/24 Diet Prior to this Study: Dysphagia 1 (pureed);Thin liquids (Level 0) Temperature Spikes Noted: No Respiratory Status: Room air History of Recent Intubation: No Behavior/Cognition: Alert;Cooperative;Pleasant mood Oral Cavity - Dentition: Missing dentition;Poor condition Self-Feeding Abilities: Able to feed self Vision: Functional for self-feeding Patient Positioning: Upright in bed Baseline Vocal Quality: Normal Volitional Cough: Strong Volitional Swallow: Able to elicit  Oral Care Assessment Oral Assessment  (WDL): Exceptions to WDL Lips: Asymmetrical Teeth: Dentures upper Tongue: Pink;Moist Mucous Membrane(s): Moist;Pink Saliva: Moist, saliva free flowing Level of Consciousness: Alert Is patient on any of following O2 devices?: None of the above Nutritional status: No high risk factors Oral Assessment Risk : Low Risk Ice Chips Ice chips: Not tested Thin Liquid Thin Liquid: Within functional limits Presentation: Cup Nectar Thick Nectar Thick Liquid: Not tested Honey Thick Honey Thick Liquid: Not tested Puree Puree: Impaired Presentation: Spoon Oral Phase  Impairments: Reduced lingual movement/coordination Solid Solid: Impaired Presentation: Self Fed Oral Phase Impairments: Impaired mastication;Reduced labial seal;Other (comment) Oral Phase Functional Implications: Prolonged oral transit;Oral residue;Impaired mastication BSE Assessment Risk for Aspiration Impact on safety and function: Mild aspiration risk Other Related Risk Factors: Cognitive impairment;Previous CVA  Short Term Goals: Week 1: SLP Short Term Goal 1 (Week 1): STGs=LTGs d/t ELOS  Refer to Care Plan for Long Term Goals  Recommendations for other services: Neuropsych  Discharge Criteria: Patient will be discharged from SLP if patient refuses treatment 3 consecutive times without medical reason, if treatment goals not met, if there is a change in medical status, if patient makes no progress towards goals or if patient is discharged from hospital.  The above assessment, treatment plan, treatment alternatives and goals were discussed and mutually agreed upon: No family available/patient unable  Rosina Downy, M.A., CCC-SLP  Rosina LABOR Kamylle Axelson 08/02/2024, 1:55 PM

## 2024-08-02 NOTE — Progress Notes (Addendum)
 PROGRESS NOTE   Subjective/Complaints:  No events overnight. Vitals stable     08/02/2024   10:51 AM 08/02/2024    5:52 AM 08/01/2024    8:18 PM  Vitals with BMI  Systolic 135 146 861  Diastolic 75 77 76  Pulse 87 87 93    Labs this a.m. significant for AKI creatinine 1.2, CBC this a.m. pending  Refused breakfast this a.m., but meal intakes have been 100%.  Denies any nausea this morning. Mixed continence of bladder, PVRs low. Last BM 10-9: Large, liquid.  Patient says she feels much better after bowel movement  ROS: Denies fevers, chills, N/V, abdominal pain, constipation, diarrhea, SOB, cough, chest pain, new weakness or paraesthesias.    Objective:   No results found. Recent Labs    07/31/24 0134  WBC 6.3  HGB 7.3*  HCT 22.7*  PLT 167   Recent Labs    07/31/24 0134 08/02/24 0614  NA 141 140  K 3.6 4.2  CL 111 110  CO2 21* 17*  GLUCOSE 105* 107*  BUN 8 11  CREATININE 1.04* 1.20*  CALCIUM 10.6* 10.3    Intake/Output Summary (Last 24 hours) at 08/02/2024 1118 Last data filed at 08/02/2024 0834 Gross per 24 hour  Intake 240 ml  Output 510 ml  Net -270 ml        Physical Exam: Vital Signs Blood pressure 135/75, pulse 87, temperature 97.8 F (36.6 C), temperature source Oral, resp. rate 16, height 5' 5 (1.651 m), weight 98 kg, SpO2 99%.  Constitutional: No apparent distress. Appropriate appearance for age.  Sitting up in bedside chair. HENT: No JVD. Neck Supple. Trachea midline. Atraumatic, normocephalic. +R eye subconjunctival hematoma Eyes: PERRLA. EOMI. Visual fields grossly intact.  Cardiovascular: RRR, no murmurs/rub/gallops.  1+ bilateral peripheral edema. Peripheral pulses 2+  Respiratory: CTAB. No rales, rhonchi, or wheezing. On RA.  Abdomen: Nondistended.  Hypoactive bowel sounds. No distention or tenderness on palpation.  Skin: C/D/I. No apparent lesions.  Peripheral IVs intact    MSK:      No apparent deformity.       Neurologic exam:  Cognition: AAO to person, place, time and event.  Language: Moderate to severe dysarthria.  Difficulty word finding. Memory: Moderately impaired Insight: Poor insight into current condition.  Mood: Pleasant affect, appropriate mood.  Sensation: To light touch intact in BL UEs and LEs  Reflexes: 2+ in BL UE and LEs. Negative Hoffman's and babinski signs bilaterally.  CN: Right facial droop, right tongue deviation, right V3 sensory deficit Coordination: Ataxia right upper and lower extremity Spasticity: MAS 0 in all extremities.       Strength:  + Right upper extremity motor apraxia                RUE: 4/5 SA, 4/5 EF, 4/5 EE, 5/5 WE, 5/5 FF, 5/5 FA                LUE:  5/5 SA, 5/5 EF, 5/5 EE, 5/5 WE, 5/5 FF, 5/5 FA                RLE: 4/5 HF, 5/5 KE, 5/5  DF, 5/5  EHL,  5/5  PF                 LLE:  4-/5 HF, 4/5 KE, 4/5  DF, 4/5  EHL, 4/5  PF    Physical exam unchanged from the above on reexamination 08/02/24    Assessment/Plan: 1. Functional deficits which require 3+ hours per day of interdisciplinary therapy in a comprehensive inpatient rehab setting. Physiatrist is providing close team supervision and 24 hour management of active medical problems listed below. Physiatrist and rehab team continue to assess barriers to discharge/monitor patient progress toward functional and medical goals  Care Tool:  Bathing              Bathing assist       Upper Body Dressing/Undressing Upper body dressing        Upper body assist      Lower Body Dressing/Undressing Lower body dressing            Lower body assist       Toileting Toileting    Toileting assist       Transfers Chair/bed transfer  Transfers assist           Locomotion Ambulation   Ambulation assist              Walk 10 feet activity   Assist           Walk 50 feet activity   Assist           Walk 150 feet  activity   Assist           Walk 10 feet on uneven surface  activity   Assist           Wheelchair     Assist               Wheelchair 50 feet with 2 turns activity    Assist            Wheelchair 150 feet activity     Assist          Blood pressure 135/75, pulse 87, temperature 97.8 F (36.6 C), temperature source Oral, resp. rate 16, height 5' 5 (1.651 m), weight 98 kg, SpO2 99%.  Medical Problem List and Plan: 1. 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  Plan 30-day cardiac event monitor             -patient may shower             -ELOS/Goals: 7-10 days, SPV PT/OT/Min A SLP              - Stable for IRF admission   2.  Antithrombotics: -DVT/anticoagulation:  Pharmaceutical: Lovenox              -antiplatelet therapy: Plavix alone 75 mg daily due to aspirin  allergy 3. Pain Management chronic pain: Currently on Tylenol  as needed.  Prior to admission patient was taking hydrocodone  5-325 mg 1 tablet every 4 hours as needed as well as Robaxin  every 6 hours as needed muscle spasms.  Resume as needed 4. Mood/Behavior/Sleep/bipolar disorder: Cymbalta  30 mg daily             -antipsychotic agents: Seroquel  50 mg nightly 5. Neuropsych/cognition: This patient is not capable of making decisions on her own behalf. 6. Skin/Wound Care: Routine skin checks 7. Fluids/Electrolytes/Nutrition: Routine in and outs with follow-up chemistries  - 10-10: Poor p.o. intake this a.m.  Mild AKI.  Albumin low, 3.  Will  get dietary eval  8.  Dysphagia.  Dysphagia #1 thin liquids.  Follow-up speech therapy 9.  Hyperlipidemia.  Lipitor 10.  AKI.  Resolved.  Baseline creatinine 1.2.  Follow-up chemistries 11.  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  Dietary follow-up 12.  History of alcohol use.  Provide counseling 13.  Microcytic anemia.  Baseline hemoglobin 8-10.  Hemoglobin dipping to 7.2.  Currently stable.  Anemia panel consistent with iron  deficiency anemia.  Iron supplement added.  - 10-10: HgB stable / hemocomcentrated; monitor  14.  Hypertension.  Cozaar 25 mg daily, Bidil  20-37.5 mg 3 times daily.  Monitor with increased mobility     08/02/2024   10:51 AM 08/02/2024    5:52 AM 08/01/2024    8:18 PM  Vitals with BMI  Systolic 135 146 861  Diastolic 75 77 76  Pulse 87 87 93    15.  Constipation.  No bowel movement since admission.  Is passing flatus.  - Large bowel movement 10-9 16.  Urinary incontinence.  Removed PureWick, monitor PVRs.   - 10-10: PVRs low.  Remains incontinent.  Start timed toileting 17.  AKI.  Mild, encourage p.o. fluids.  Repeat BMP in AM.  LOS: 1 days A FACE TO FACE EVALUATION WAS PERFORMED  Christie Meza Likes 08/02/2024, 11:18 AM

## 2024-08-02 NOTE — Progress Notes (Signed)
 Inpatient Rehabilitation Admission Medication Review by a Pharmacist  A complete drug regimen review was completed for this patient to identify any potential clinically significant medication issues.  High Risk Drug Classes Is patient taking? Indication by Medication  Antipsychotic Yes Quetiapine  - agitation, mood  Anticoagulant Yes Lovenox  - VTE ppx  Antibiotic No   Opioid No   Antiplatelet Yes Clopidogrel - CVA (Aspirin  allergy)  Hypoglycemics/insulin No   Vasoactive Medication Yes Losartan - BP Isordil  - CAD  Chemotherapy No   Other Yes Atorvastatin - HLD Duloxetine  - mood, pain     Type of Medication Issue Identified Description of Issue Recommendation(s)  Drug Interaction(s) (clinically significant)     Duplicate Therapy     Allergy     No Medication Administration End Date     Incorrect Dose     Additional Drug Therapy Needed     Significant med changes from prior encounter (inform family/care partners about these prior to discharge).    Other       Clinically significant medication issues were identified that warrant physician communication and completion of prescribed/recommended actions by midnight of the next day:  No  Name of provider notified for urgent issues identified:   Provider Method of Notification:     Pharmacist comments: None  Time spent performing this drug regimen review (minutes):  20 minutes Thank you. Olam Monte, PharmD

## 2024-08-02 NOTE — Plan of Care (Signed)
  Problem: RH Swallowing Goal: LTG Patient will consume least restrictive diet using compensatory strategies with assistance (SLP) Description: LTG:  Patient will consume least restrictive diet using compensatory strategies with assistance (SLP) Flowsheets (Taken 08/02/2024 1356) LTG: Pt Patient will consume least restrictive diet using compensatory strategies with assistance of (SLP): Minimal Assistance - Patient > 75%   Problem: RH Cognition - SLP Goal: RH LTG Patient will demonstrate orientation with cues Description:  LTG:  Patient will demonstrate orientation to person/place/time/situation with cues (SLP)   Flowsheets (Taken 08/02/2024 1356) LTG Patient will demonstrate orientation to:  Person  Place  Time  Situation LTG: Patient will demonstrate orientation using cueing (SLP): Minimal Assistance - Patient > 75%   Problem: RH Comprehension Communication Goal: LTG Patient will comprehend basic/complex auditory (SLP) Description: LTG: Patient will comprehend basic/complex auditory information with cues (SLP). Flowsheets (Taken 08/02/2024 1356) LTG: Patient will comprehend: (mildly complex) -- LTG: Patient will comprehend auditory information with cueing (SLP): Minimal Assistance - Patient > 75%   Problem: RH Expression Communication Goal: LTG Patient will increase speech intelligibility (SLP) Description: LTG: Patient will increase speech intelligibility at word/phrase/conversation level with cues, % of the time (SLP) Flowsheets (Taken 08/02/2024 1356) LTG: Patient will increase speech intelligibility (SLP): Minimal Assistance - Patient > 75% Level: Phrase Percent of time patient will use intelligible speech: 75% Goal: LTG Patient will increase word finding of common (SLP) Description: LTG:  Patient will increase word finding of common objects/daily info/abstract thoughts with cues using compensatory strategies (SLP). Flowsheets (Taken 08/02/2024 1356) LTG: Patient will increase  word finding of common (SLP): Minimal Assistance - Patient > 75%   Problem: RH Awareness Goal: LTG: Patient will demonstrate awareness during functional activites type of (SLP) Description: LTG: Patient will demonstrate awareness during functional activites type of (SLP) Flowsheets (Taken 08/02/2024 1356) Patient will demonstrate during cognitive/linguistic activities awareness type of: Intellectual LTG: Patient will demonstrate awareness during cognitive/linguistic activities with assistance of (SLP): Minimal Assistance - Patient > 75%

## 2024-08-02 NOTE — Progress Notes (Signed)
 Inpatient Rehabilitation Care Coordinator Assessment and Plan Patient Details  Name: Christie Meza MRN: [id number] Date of Birth: 10-15-1946  Today's Date: 08/02/2024  Hospital Problems: Principal Problem:   Left middle cerebral artery stroke [facility])  Past Medical History:  Past Medical History:  Diagnosis Date   Bipolar 1 disorder (HCC)    Hypertension    Obesity    Past Surgical History:  Past Surgical History:  Procedure Laterality Date   ABDOMINAL HYSTERECTOMY  ~2002   ANTERIOR CERVICAL DECOMP/DISCECTOMY FUSION N/A 02/24/2022   Procedure: ANTERIOR CERVICAL THREE- FOUR AND CERVICAL FOUR THROUGH FIVE DECOMPRESSION/DISCECTOMY FUSION 2 LEVELS;  Surgeon: Colon Shove, MD;  Location: [facility] OR;  Service: Neurosurgery;  Laterality: N/A;   Social History:  reports that she has never smoked. She has never used smokeless tobacco. She reports current alcohol use. She reports current drug use.  Family / Support Systems Marital Status: Single Patient Roles: Partner, Parent, Other (Comment) (grandparent/great grandparent) Spouse/Significant Other: [name] Children: [name] [phone] Other Supports: grandchildren and other relatives Anticipated Caregiver: [name] and [name] Ability/Limitations of Caregiver: Between both can provide 24/7 care. Mostly will be [name] Caregiver Availability: 24/7 Family Dynamics: Close knit with daughter and daughter's family she has [name] who will make sure her needs are met. She will have 24/7 care with family.  Social History Preferred language: English Religion: None Cultural Background: NA Education: HS Health Literacy - How often do you need to have someone help you when you read instructions, pamphlets, or other written material from your doctor or pharmacy?: Never Writes: Yes Employment Status: Retired Marine scientist Issues: None Guardian/Conservator: None according to MD pt is not fully capable of making her own decisions while here.  Will look toward daughter-[name] to make any decisions while here soince no formal POA/HCPOA in place   Abuse/Neglect Abuse/Neglect Assessment Can Be Completed: Yes Physical Abuse: Denies Verbal Abuse: Denies Sexual Abuse: Denies Exploitation of patient/patient's resources: Denies Self-Neglect: Denies  Patient response to: Social Isolation - How often do you feel lonely or isolated from those around you?: Never  Emotional Status Pt's affect, behavior and adjustment status: Pt reports she did her care on her own the only thing she did not do is drive. Her family provides this. She hopes to get back to being able to do for herself and not on the D1 diet she does not like this. Recent Psychosocial Issues: other health issues was in [facility] after back surgery in 2023 Psychiatric History: HX-bipolar take medications for this and feels it helps. 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 She is aware her speech therapist will be working on this with her while here. Premorbid pt/family roles/activities: mom, partner, grandmother, great grandmother, retiree, etc Anticipated changes in roles/activities/participation: resume Pt/family expectations/goals: Pt states:  I want to eat regular food, not this.  Community Resources [employer]: Other (Comment) (Been to [name] in 2023) Premorbid Home Care/DME Agencies: Other (Comment) (cane and tub seat) Transportation available at discharge: family provides Is the patient able to respond to transportation needs?: Yes In the past 12 months, has lack of transportation kept you from medical appointments or from  getting medications?: No In the past 12 months, has  lack of transportation kept you from meetings, work, or from getting things needed for daily living?: No  Discharge Planning Living Arrangements: Spouse/significant other Support Systems: Spouse/significant other, Children, Other relatives, Friends/neighbors Type of Residence: Private residence Insurance Resources: Media planner (specify) ([employer] Medicare) Surveyor, quantity Resources: Tree surgeon, Family Support Financial Screen Referred: No Living Expenses: Lives with family Money Management: Significant Other, Family Does the patient have any problems obtaining your medications?: No 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 Between both will have 24/7 care. She has grwon grandchildren who will be checking on also. Aware being evaluated today and goals being set for stay here. Care Coordinator Barriers to Discharge: Insurance for SNF coverage Care Coordinator Anticipated Follow Up Needs: HH/OP  Clinical Impression Pleasant female who is used to doing for herself and wants to eat regular food and not the D1 diet she is on. She is aware speech therapist will be working with her on this. Awaiting return call from [facility] her daughter to confirm 24/7 care at discharge. Primary LCSW-Auria will return on Monday and update on Tuesday after team conference.    Raymonde Asberry MATSU 08/02/2024, 10:56 AM

## 2024-08-03 DIAGNOSIS — K219 Gastro-esophageal reflux disease without esophagitis: Secondary | ICD-10-CM

## 2024-08-03 LAB — BASIC METABOLIC PANEL WITH GFR
Anion gap: 12 (ref 5–15)
BUN: 11 mg/dL (ref 8–23)
CO2: 20 mmol/L — ABNORMAL LOW (ref 22–32)
Calcium: 10.7 mg/dL — ABNORMAL HIGH (ref 8.9–10.3)
Chloride: 108 mmol/L (ref 98–111)
Creatinine, Ser: 1.27 mg/dL — ABNORMAL HIGH (ref 0.44–1.00)
GFR, Estimated: 44 mL/min — ABNORMAL LOW (ref >60)
Glucose, Bld: 106 mg/dL — ABNORMAL HIGH (ref 70–99)
Potassium: 3.6 mmol/L (ref 3.5–5.1)
Sodium: 140 mmol/L (ref 135–145)

## 2024-08-03 MED ORDER — FAMOTIDINE 20 MG PO TABS
10.0000 mg | ORAL_TABLET | Freq: Two times a day (BID) | ORAL | Status: DC
  Administered 2024-08-03 – 2024-08-06 (×7): 10 mg via ORAL
  Filled 2024-08-03 (×7): qty 1

## 2024-08-03 NOTE — Progress Notes (Signed)
 Occupational Therapy Session Note  Patient Details  Name: Christie Meza MRN: [id number] Date of Birth: Nov 13, 1945  Today's Date: 08/03/2024 OT Individual Time: 8953-8895 OT Individual Time Calculation (min): 18 min    Short Term Goals: Week 1:  OT Short Term Goal 1 (Week 1): STG= LTG d/t ELOS  Skilled Therapeutic Interventions/Progress Updates: Upon approach for OT services, patient presented with difficulty with expressive communication but was able to indicate after initiatially stating that she was very tired and needed to rest, that she would concur to participate in R UE Neuro re education and functional UE use.    However, she also was able to express that she feels a flutter in her chest and she thought it may be her heart or lungs.  Left wrist radial pulse revealed irregular pulse rhythm.   This clinician informed RN of irregular pulse and patient statement, including, flutter... to RN.  RN to assess.  Therapy was halted at this point to allow for patient assessment.    Left was left supine in bed with HOB elevated with call bell and phone in place as well as bed alarm engaged.  Patient will benefit from continued OT services if/when medical status allows.       Therapy Documentation Precautions:  Precautions Precautions: Fall (right inattention.) Precaution/Restrictions Comments: R inattention Restrictions Weight Bearing Restrictions Per Provider Order: No  General: Missed 57 minutes of OT treatment:  complaints of extreme fatigue and flutter here patient referred to left chest area . irregular pulse rhythm at left radial wrist. RN informed    Pain: Pain Assessment Pain Scale: 0-10 Pain Score: 0-No pain ADL:  Vision   Perception    Praxis   Balance   Exercises:   Other Treatments:     Therapy/Group: Individual Therapy  [place]eta Catheryn [place] 08/03/2024, 2:59 PM

## 2024-08-03 NOTE — Plan of Care (Signed)
  Problem: RH BOWEL ELIMINATION Goal: RH STG MANAGE BOWEL WITH ASSISTANCE Description: STG Manage Bowel with supervision-minimal Assistance. Outcome: Progressing   Problem: RH BLADDER ELIMINATION Goal: RH STG MANAGE BLADDER WITH ASSISTANCE Description: STG Manage Bladder With supervision- minimal Assistance Outcome: Progressing   Problem: RH SKIN INTEGRITY Goal: RH STG SKIN FREE OF INFECTION/BREAKDOWN Description: Manage skin free of infection/breakdown with supervision-minimal assistance Outcome: Progressing

## 2024-08-03 NOTE — Progress Notes (Signed)
 Physical Therapy Session Note  Patient Details  Name: Christie Meza MRN: [id number] Date of Birth: Jan 05, 1946  Today's Date: 08/03/2024 PT Missed Time: 34 Minutes Missed Time Reason: Patient fatigue;Patient unwilling to participate  Short Term Goals: Week 1:  PT Short Term Goal 1 (Week 1): STG=LTG 2/2 ELOS  Skilled Therapeutic Interventions/Progress Updates:    Pt missed x 45 min scheduled PT. She politely declined any and all attempts at mobility, stating,I need time to get myself right. And, I will do it tomorrow. Will make up as schedule allows.   Therapy Documentation Precautions:  Precautions Precautions: Fall (right inattention.) Precaution/Restrictions Comments: R inattention Restrictions Weight Bearing Restrictions Per Provider Order: No General: PT Amount of Missed Time (min): 34 Minutes PT Missed Treatment Reason: Patient fatigue;Patient unwilling to participate     Therapy/Group: Individual Therapy  Schuyler JAYSON Batter 08/03/2024, 2:23 PM

## 2024-08-03 NOTE — Progress Notes (Addendum)
 Physical Therapy Session Note  Patient Details  Name: Christie Meza MRN: [id number] Date of Birth: 1945/11/22  Today's Date: 08/03/2024 PT Individual Time: 9193-9183 PT Individual Time Calculation (min): 10 min  Today's Date: 08/03/2024 PT Missed Time: 65 Minutes Missed Time Reason: Patient fatigue;Patient unwilling to participate  Short Term Goals: Week 1:  PT Short Term Goal 1 (Week 1): STG=LTG 2/2 ELOS  Skilled Therapeutic Interventions/Progress Updates: Patient semi-reclined in bed on entrance to room.   Patient reported no pain. Pt politely declined any therapies today. PTA sat down at bedside and provided active listening and encouragement. PTA also educated pt on benefits of pushing self to increase activity with skilled therapy in order to progress LOF and to avoid bed induced debility. Pt still refusing despite best efforts. PTA suggested to come back later during session to reassess with pt accepting.  PTA arrived back shortly after and pt continued to refuse stating please stop bothering me. PTA iterated to pt about minute compliance and encouraged pt to participate in future sessions this day. Pt might benefit with 15/7 if this continues to ensure compliance with inpatient rehabilitation program.      Therapy Documentation Precautions:  Precautions Precautions: Fall (right inattention.) Precaution/Restrictions Comments: R inattention Restrictions Weight Bearing Restrictions Per Provider Order: No  Therapy/Group: Individual Therapy  Danil Wedge PTA 08/03/2024, 8:54 AM

## 2024-08-03 NOTE — Progress Notes (Signed)
 PROGRESS NOTE   Subjective/Complaints:  Had a fluttering feeling in chest  , EKG NSR no concerning features, has this mainly at night when laying on her side   ROS: Denies fevers, chills, N/V, abdominal pain, constipation, diarrhea, SOB, cough, chest pain, new weakness or paraesthesias.    Objective:   No results found. Recent Labs    08/02/24 1051  WBC 7.2  HGB 9.0*  HCT 28.3*  PLT 220   Recent Labs    08/02/24 0614 08/03/24 0642  NA 140 140  K 4.2 3.6  CL 110 108  CO2 17* 20*  GLUCOSE 107* 106*  BUN 11 11  CREATININE 1.20* 1.27*  CALCIUM 10.3 10.7*    Intake/Output Summary (Last 24 hours) at 08/03/2024 1314 Last data filed at 08/03/2024 1254 Gross per 24 hour  Intake 100 ml  Output --  Net 100 ml        Physical Exam: Vital Signs Blood pressure (!) 144/75, pulse 86, temperature 98 F (36.7 C), temperature source Oral, resp. rate 20, height 5' 5 (1.651 m), weight 98.6 kg, SpO2 98%.   General: No acute distress Mood and affect are appropriate Heart: Regular rate and rhythm no rubs murmurs or extra sounds Lungs: Clear to auscultation, breathing unlabored, no rales or wheezes Abdomen: Positive bowel sounds, soft nontender to palpation, nondistended Extremities: No clubbing, cyanosis, or edema Skin: No evidence of breakdown, no evidence of rash    MSK:      No apparent deformity.   Spasticity: MAS 0 in all extremities.       Strength:  + Right upper extremity motor apraxia                RUE: 4/5 SA, 4/5 EF, 4/5 EE, 5/5 WE, 5/5 FF, 5/5 FA                LUE:  5/5 SA, 5/5 EF, 5/5 EE, 5/5 WE, 5/5 FF, 5/5 FA                RLE: 4/5 HF, 5/5 KE, 5/5  DF, 5/5  EHL, 5/5  PF                 LLE:  4-/5 HF, 4/5 KE, 4/5  DF, 4/5  EHL, 4/5  PF    Physical exam unchanged from the above on reexamination 08/03/24    Assessment/Plan: 1. Functional deficits which require 3+ hours per day of  interdisciplinary therapy in a comprehensive inpatient rehab setting. Physiatrist is providing close team supervision and 24 hour management of active medical problems listed below. Physiatrist and rehab team continue to assess barriers to discharge/monitor patient progress toward functional and medical goals  Care Tool:  Bathing    Body parts bathed by patient: Right arm, Left arm, Chest, Abdomen, Front perineal area, Right upper leg, Buttocks, Face, Left lower leg, Right lower leg, Left upper leg         Bathing assist Assist Level: Moderate Assistance - Patient 50 - 74%     Upper Body Dressing/Undressing Upper body dressing   What is the patient wearing?: Pull over shirt    Upper body assist  Assist Level: Moderate Assistance - Patient 50 - 74%    Lower Body Dressing/Undressing Lower body dressing      What is the patient wearing?: Underwear/pull up, Pants     Lower body assist Assist for lower body dressing: Moderate Assistance - Patient 50 - 74%     Toileting Toileting    Toileting assist Assist for toileting: Moderate Assistance - Patient 50 - 74%     Transfers Chair/bed transfer  Transfers assist     Chair/bed transfer assist level: Minimal Assistance - Patient > 75%     Locomotion Ambulation   Ambulation assist      Assist level: Minimal Assistance - Patient > 75% Assistive device: No Device Max distance: 40   Walk 10 feet activity   Assist     Assist level: Minimal Assistance - Patient > 75% Assistive device: No Device   Walk 50 feet activity   Assist Walk 50 feet with 2 turns activity did not occur: Safety/medical concerns         Walk 150 feet activity   Assist Walk 150 feet activity did not occur: Safety/medical concerns         Walk 10 feet on uneven surface  activity   Assist Walk 10 feet on uneven surfaces activity did not occur: Safety/medical concerns         Wheelchair     Assist Is the patient using  a wheelchair?: Yes Type of Wheelchair: Manual    Wheelchair assist level: Dependent - Patient 0% Max wheelchair distance: 0 (unable to mobilize w/ RUE.)    Wheelchair 50 feet with 2 turns activity    Assist        Assist Level: Dependent - Patient 0%   Wheelchair 150 feet activity     Assist      Assist Level: Dependent - Patient 0%   Blood pressure (!) 144/75, pulse 86, temperature 98 F (36.7 C), temperature source Oral, resp. rate 20, height 5' 5 (1.651 m), weight 98.6 kg, SpO2 98%.  Medical Problem List and Plan: 1. 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  Plan 30-day cardiac event monitor             -patient may shower             -ELOS/Goals: 7-10 days, SPV PT/OT/Min A SLP                2.  Antithrombotics: -DVT/anticoagulation:  Pharmaceutical: Lovenox              -antiplatelet therapy: Plavix alone 75 mg daily due to aspirin  allergy 3. Pain Management chronic pain: Currently on Tylenol  as needed.  Prior to admission patient was taking hydrocodone  5-325 mg 1 tablet every 4 hours as needed as well as Robaxin  every 6 hours as needed muscle spasms.  Resume as needed 4. Mood/Behavior/Sleep/bipolar disorder: Cymbalta  30 mg daily             -antipsychotic agents: Seroquel  50 mg nightly 5. Neuropsych/cognition: This patient is not capable of making decisions on her own behalf. 6. Skin/Wound Care: Routine skin checks 7. Fluids/Electrolytes/Nutrition: Routine in and outs with follow-up chemistries  - 10-10: Poor p.o. intake this a.m.  Mild AKI.  Albumin low, 3.  Will get dietary eval  8.  Dysphagia.  Dysphagia #1 thin liquids.  Follow-up speech therapy 9.  Hyperlipidemia.  Lipitor 10.  AKI.  Resolved.  Baseline creatinine 1.2.  Follow-up chemistries 11.  Class II  obesity.  BMI 38.89.  Dietary follow-up 12.  History of alcohol use.  Provide counseling 13.  Microcytic anemia.  Baseline hemoglobin 8-10.  Hemoglobin dipping to 7.2.   Currently stable.  Anemia panel consistent with iron deficiency anemia.  Iron supplement added.   14.  Hypertension.  Cozaar 25 mg daily, Bidil  20-37.5 mg 3 times daily.  Monitor with increased mobility     08/03/2024   11:20 AM 08/03/2024    6:00 AM 08/03/2024    3:13 AM  Vitals with BMI  Weight  217 lbs 6 oz   BMI  36.17   Systolic 144  109  Diastolic 75  65  Pulse 86  97    15.  Constipation.  No bowel movement since admission.  Is passing flatus.  - Large bowel movement 10-9 16.  Urinary incontinence.  Removed PureWick, monitor PVRs.    17.  AKI.  Mild, encourage p.o. fluids.  Repeat BMP in AM. 18.  Suspect GERD positional at night  EKG normal add Pecid 10mg  BID   LOS: 2 days A FACE TO FACE EVALUATION WAS PERFORMED  Christie Meza 08/03/2024, 1:14 PM

## 2024-08-04 NOTE — Plan of Care (Signed)
  Problem: Consults Goal: RH STROKE PATIENT EDUCATION Description: See Patient Education module for education specifics  Outcome: Progressing   Problem: RH BOWEL ELIMINATION Goal: RH STG MANAGE BOWEL WITH ASSISTANCE Description: STG Manage Bowel with supervision-minimal Assistance. Outcome: Progressing   Problem: RH BLADDER ELIMINATION Goal: RH STG MANAGE BLADDER WITH ASSISTANCE Description: STG Manage Bladder With supervision- minimal Assistance Outcome: Progressing   Problem: RH SKIN INTEGRITY Goal: RH STG SKIN FREE OF INFECTION/BREAKDOWN Description: Manage skin free of infection/breakdown with supervision-minimal assistance Outcome: Progressing   Problem: RH SAFETY Goal: RH STG ADHERE TO SAFETY PRECAUTIONS W/ASSISTANCE/DEVICE Description: STG Adhere to Safety Precautions With supervision- minimal Assistance/Device. Outcome: Progressing   Problem: RH PAIN MANAGEMENT Goal: RH STG PAIN MANAGED AT OR BELOW PT'S PAIN GOAL Description: < 4 w/ prns Outcome: Progressing   Problem: RH KNOWLEDGE DEFICIT Goal: RH STG INCREASE KNOWLEDGE OF HYPERTENSION Description: Manage increase knowledge of hypertension with supervision- minimal assistance from partner / dtr using educational materials provided Outcome: Progressing Goal: RH STG INCREASE KNOWLEDGE OF DYSPHAGIA/FLUID INTAKE Description: Manage increase knowledge of dysphagia with supervision- minimal assistance from partner / dtr using educational materials provided Outcome: Progressing Goal: RH STG INCREASE KNOWLEGDE OF HYPERLIPIDEMIA Description: Manage increase knowledge of hyperlipidemia with supervision- minimal assistance from partner / dtr using educational materials provided Outcome: Progressing Goal: RH STG INCREASE KNOWLEDGE OF STROKE PROPHYLAXIS Description: Manage increase knowledge of stroke prophylaxis  with supervision- minimal assistance from partner / dtr using educational materials provided Outcome: Progressing

## 2024-08-04 NOTE — Progress Notes (Addendum)
 PROGRESS NOTE   Subjective/Complaints:  Had a fluttering feeling in chest  Friday night but not last noc.  Started pepcid yesterday , 10/11 EKG NSR  ROS: Denies fevers, chills, N/V, abdominal pain, constipation, diarrhea, SOB, cough, chest pain, new weakness or paraesthesias.    Objective:   No results found. Recent Labs    08/02/24 1051  WBC 7.2  HGB 9.0*  HCT 28.3*  PLT 220   Recent Labs    08/02/24 0614 08/03/24 0642  NA 140 140  K 4.2 3.6  CL 110 108  CO2 17* 20*  GLUCOSE 107* 106*  BUN 11 11  CREATININE 1.20* 1.27*  CALCIUM 10.3 10.7*    Intake/Output Summary (Last 24 hours) at 08/04/2024 1053 Last data filed at 08/04/2024 1000 Gross per 24 hour  Intake 170 ml  Output --  Net 170 ml        Physical Exam: Vital Signs Blood pressure 120/64, pulse 86, temperature 98.2 F (36.8 C), temperature source Oral, resp. rate 18, height 5' 5 (1.651 m), weight 98.5 kg, SpO2 98%.   General: No acute distress Mood and affect are appropriate Heart: Regular rate and rhythm no rubs murmurs or extra sounds Lungs: Clear to auscultation, breathing unlabored, no rales or wheezes Abdomen: Positive bowel sounds, soft nontender to palpation, nondistended Extremities: No clubbing, cyanosis, or edema Skin: No evidence of breakdown, no evidence of rash    MSK:      No apparent deformity.   Spasticity: MAS 0 in all extremities.       Strength:  + Right upper extremity motor apraxia                RUE: 4/5 SA, 4/5 EF, 4/5 EE, 5/5 WE, 5/5 FF, 5/5 FA                LUE:  5/5 SA, 5/5 EF, 5/5 EE, 5/5 WE, 5/5 FF, 5/5 FA                RLE: 4/5 HF, 5/5 KE, 5/5  DF, 5/5  EHL, 5/5  PF                 LLE:  4-/5 HF, 4/5 KE, 4/5  DF, 4/5  EHL, 4/5  PF    Physical exam unchanged from the above on reexamination 08/04/24    Assessment/Plan: 1. Functional deficits which require 3+ hours per day of interdisciplinary therapy  in a comprehensive inpatient rehab setting. Physiatrist is providing close team supervision and 24 hour management of active medical problems listed below. Physiatrist and rehab team continue to assess barriers to discharge/monitor patient progress toward functional and medical goals  Care Tool:  Bathing    Body parts bathed by patient: Right arm, Left arm, Chest, Abdomen, Front perineal area, Right upper leg, Buttocks, Face, Left lower leg, Right lower leg, Left upper leg         Bathing assist Assist Level: Moderate Assistance - Patient 50 - 74%     Upper Body Dressing/Undressing Upper body dressing   What is the patient wearing?: Pull over shirt    Upper body assist Assist Level: Moderate Assistance -  Patient 50 - 74%    Lower Body Dressing/Undressing Lower body dressing      What is the patient wearing?: Underwear/pull up, Pants     Lower body assist Assist for lower body dressing: Moderate Assistance - Patient 50 - 74%     Toileting Toileting    Toileting assist Assist for toileting: Moderate Assistance - Patient 50 - 74%     Transfers Chair/bed transfer  Transfers assist     Chair/bed transfer assist level: Minimal Assistance - Patient > 75%     Locomotion Ambulation   Ambulation assist      Assist level: Minimal Assistance - Patient > 75% Assistive device: No Device Max distance: 40   Walk 10 feet activity   Assist     Assist level: Minimal Assistance - Patient > 75% Assistive device: No Device   Walk 50 feet activity   Assist Walk 50 feet with 2 turns activity did not occur: Safety/medical concerns         Walk 150 feet activity   Assist Walk 150 feet activity did not occur: Safety/medical concerns         Walk 10 feet on uneven surface  activity   Assist Walk 10 feet on uneven surfaces activity did not occur: Safety/medical concerns         Wheelchair     Assist Is the patient using a wheelchair?: Yes Type  of Wheelchair: Manual    Wheelchair assist level: Dependent - Patient 0% Max wheelchair distance: 0 (unable to mobilize w/ RUE.)    Wheelchair 50 feet with 2 turns activity    Assist        Assist Level: Dependent - Patient 0%   Wheelchair 150 feet activity     Assist      Assist Level: Dependent - Patient 0%   Blood pressure 120/64, pulse 86, temperature 98.2 F (36.8 C), temperature source Oral, resp. rate 18, height 5' 5 (1.651 m), weight 98.5 kg, SpO2 98%.  Medical Problem List and Plan: 1. 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  Plan 30-day cardiac event monitor             -patient may shower             -ELOS/Goals: 7-10 days, SPV PT/OT/Min A SLP                2.  Antithrombotics: -DVT/anticoagulation:  Pharmaceutical: Lovenox              -antiplatelet therapy: Plavix alone 75 mg daily due to aspirin  allergy 3. Pain Management chronic pain: Currently on Tylenol  as needed.  Prior to admission patient was taking hydrocodone  5-325 mg 1 tablet every 4 hours as needed as well as Robaxin  every 6 hours as needed muscle spasms.  Resume as needed 4. Mood/Behavior/Sleep/bipolar disorder: Cymbalta  30 mg daily             -antipsychotic agents: Seroquel  50 mg nightly 5. Neuropsych/cognition: This patient is not capable of making decisions on her own behalf. 6. Skin/Wound Care: Routine skin checks 7. Fluids/Electrolytes/Nutrition: Routine in and outs with follow-up chemistries  - 10-10: Poor p.o. intake this a.m.  Mild AKI.  Albumin low, 3.  Will get dietary eval  8.  Dysphagia.  Dysphagia #1 thin liquids.  Follow-up speech therapy 9.  Hyperlipidemia.  Lipitor 10.  AKI.  Resolved.  Baseline creatinine 1.2.  Follow-up chemistries 11.  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  Dietary  follow-up 12.  History of alcohol use.  Provide counseling 13.  Microcytic anemia.  Baseline hemoglobin 8-10.  Hemoglobin dipping to 7.2.  Currently stable.  Anemia  panel consistent with iron deficiency anemia.  Iron supplement added.   14.  Hypertension.  Cozaar 25 mg daily, Bidil  20-37.5 mg 3 times daily.  Monitor with increased mobility     08/04/2024   10:00 AM 08/04/2024    5:25 AM 08/04/2024    3:50 AM  Vitals with BMI  Weight  217 lbs 2 oz   BMI  36.14   Systolic 120  113  Diastolic 64  60  Pulse 86  90    15.  Constipation.  No bowel movement since admission.  Is passing flatus.  - Large bowel movement 10-9 16.  Urinary incontinence.  Removed PureWick, monitor PVRs.    17.  AKI.  Mild, encourage p.o. fluids.  Repeat BMP in AM. 18.  Suspect GERD positional at night  EKG normal add Pecid 10mg  BID - pt notes improvement in symptoms  LOS: 3 days A FACE TO FACE EVALUATION WAS PERFORMED  Prentice FORBES Compton 08/04/2024, 10:53 AM

## 2024-08-05 ENCOUNTER — Other Ambulatory Visit: Payer: Self-pay | Admitting: Student

## 2024-08-05 DIAGNOSIS — I639 Cerebral infarction, unspecified: Secondary | ICD-10-CM

## 2024-08-05 DIAGNOSIS — I491 Atrial premature depolarization: Secondary | ICD-10-CM

## 2024-08-05 DIAGNOSIS — I63512 Cerebral infarction due to unspecified occlusion or stenosis of left middle cerebral artery: Secondary | ICD-10-CM

## 2024-08-05 DIAGNOSIS — I440 Atrioventricular block, first degree: Secondary | ICD-10-CM

## 2024-08-05 NOTE — Progress Notes (Signed)
 Physical Therapy Session Note  Patient Details  Name: Christie Meza MRN: [id number] Date of Birth: [birth year]  Today's Date: 08/05/2024 PT Individual Time: 1105-1200 PT Individual Time Calculation (min): 55 min   Short Term Goals: Week 1:  PT Short Term Goal 1 (Week 1): STG=LTG 2/2 ELOS  Skilled Therapeutic Interventions/Progress Updates:     Pt received seated in WC and does not complain of pain. Pt attempts to refuse therapy due to being up and down all day long. PT attempts to gently encourage pt to participate, as well as educating on benefits of participation versus risks of not participating. Eventually pt is agreeable to performing therapy in room. Pt performs sit to stand from [facility] with CGA and cues for hand placement, sequencing, and body mechanics. Pt ambulates x40' with RW and CGA, with cues to adjust RUE grip on RW and maintain attention to RUE, as well as increasing upright gaze to improve posture and balance. Pt demonstrates decreased safety with stand to sit, allowing body to fall backward into WC rather than utilizing eccentric control. PT provides education on improved body mechanics and safety. PT adjusts RW for improved fit, and following extended seated rest break, pt stands and ambulates x40' with similar assistance and cues, with slightly improved safety and sequencing of stand to sit. Pt performs multiple reps of sit to stand to work on functional strengthening and transfer training. Pt completes with close supervision and cues for hand placement, body mechanics, and sequencing. Following, pt left seated with all needs within reach.   Therapy Documentation Precautions:  Precautions Precautions: Fall (right inattention.) Precaution/Restrictions Comments: R inattention Restrictions Weight Bearing Restrictions Per Provider Order: No    Therapy/Group: Individual Therapy  Elsie JAYSON Dawn, PT, DPT 08/05/2024, 4:59 PM

## 2024-08-05 NOTE — Plan of Care (Signed)
  Problem: Consults Goal: RH STROKE PATIENT EDUCATION Description: See Patient Education module for education specifics  Outcome: Progressing Note: Family and patient having a good back and forth when addressing the topic and what this means for when she goes home and what changes need to be made   Problem: RH BOWEL ELIMINATION Goal: RH STG MANAGE BOWEL WITH ASSISTANCE Description: STG Manage Bowel with supervision-minimal Assistance. Outcome: Progressing   Problem: RH BLADDER ELIMINATION Goal: RH STG MANAGE BLADDER WITH ASSISTANCE Description: STG Manage Bladder With supervision- minimal Assistance Outcome: Progressing Flowsheets (Taken 08/05/2024 1725) STG: Pt will manage bladder with assistance: 4-Minimal assistance Note: Still having bouts of incontinence

## 2024-08-05 NOTE — Progress Notes (Signed)
 30 day monitor for stroke. Dr Kate to read.

## 2024-08-05 NOTE — Progress Notes (Signed)
 Patient ID: Christie Meza, female   DOB: [birth year], 78 y.o.   MRN: [id number]   1251- SW spoke with pt dtr Christie Meza to introduce self, explain role, discuss discharge process, and inform on ELOS. Fam edu scheduled for Thursday 9am-12pm. SW will confirm there are no barriers to discharge.   Graeme Jude, MSW, LCSW Office: [phone] Cell: [phone] Fax: [phone]

## 2024-08-05 NOTE — Progress Notes (Signed)
 Speech Language Pathology Daily Session Note  Patient Details  Name: Christie Meza MRN: [id number] Date of Birth: [birth year]  Today's Date: 08/05/2024 SLP Individual Time: 1004-1100 SLP Individual Time Calculation (min): 56 min  Short Term Goals: Week 1: SLP Short Term Goal 1 (Week 1): STGs=LTGs d/t ELOS  Skilled Therapeutic Interventions: Pt greeted at bedside. She was awake/alert in her WC upon SLP arrival, agreeable to tx targeting cognition, language, and dysphagia. She continues to report intermittent heart flutter. She was holding her chest upon SLP arrival, but reported that the sensation was passing as SLP fully entered the room. Denied pain w/ this sensation. DO and nursing made aware. SLP then facilitated Dys 2 trials and thin liquids. No overt s/s of airway invasion were evident and adequate oral clearance was noted given slightly additional time. She attempted Dys 3 trials, but expectorated her trial before initiating a swallow. She reported no appetite and politely declined additional trials. Given inability to truly trial Dys 3 textures, upgrade to Dys 2 finely chopped at this time. Recommend continued full supervision during meals and meds whole in puree at this time. She would benefit from continued PO trials in upcoming tx sessions to determine appropriateness of upgrade to Dys 3 textures. During orientation review, she spontaneously reported the month. Noted to initially report 1925 as the year, however, accurately identified the date and year when provided w/ 2 choices. Pt was provided w/ a calendar to assist w/ future verbalizations of orientation information. SLP then facilitated mildly specific responsive naming task, where she benefited from [facility] for word finding overall. Errors typically comprised of semantic or phonemic paraphasias. In conversation, she also benefited from [facility] for sentence formulation and word finding. At the end of tx tasks, she was left in her chair w/  the alarm set and call light within reach. Recommend continued ST per POC.   Pain  None reported  Therapy/Group: Individual Therapy  Recardo DELENA Mole 08/05/2024, 10:44 AM

## 2024-08-05 NOTE — Progress Notes (Signed)
 Occupational Therapy Session Note  Patient Details  Name: Christie Meza MRN: [id number] Date of Birth: Apr 08, 1946  Today's Date: 08/05/2024 OT Individual Time: 9265-9169 OT Individual Time Calculation (min): 56 min    Short Term Goals: Week 1:  OT Short Term Goal 1 (Week 1): STG= LTG d/t ELOS  Skilled Therapeutic Interventions/Progress Updates:    Pt received on the toilet with NT assisting, no c/o pain. Took over supervision on toilet, pt took washcloth and cleaned herself in standing with min A for peri hygiene thoroughness. She declined shower despite encouragement. She used the RW and completed functional mobility to the w/c, about 10 ft, with CGA and cueing for R attention during RW management. She was set up for breakfast. She stated I just don't want it , upset with D1 texture food. Her tray was set up to promote increased R attention, requiring mod cueing overall. She ate applesauce and drank coffee- using L hand. After breakfast she was agreeable to take shower. She completed functional mobility into the bathroom with mod cueing for stopping and reassessing R hand placement. She required no more than CGA physically for transfer. She transferred into shower and completed UB bathing seated with poor awareness of when the R hand dropped the washcloth yet continued to wash. Min A required for UB bathing. Min A for LB bathing in standing as well. Good attention to RUE during bathing. She transferred back to EOB following and OT assisted in applying lotion and powder. She required cueing and education in importance of attempting bimanual manipulation of self care items, requiring min HOH for pulling cap off deodorant. She donned a new gown with (S). Max A to don socks. She completed oral care standing at the sink with cueing for using RUE as gross stabilizer instead of FM manipulation when removing cap off toothpaste. She returned to the w/c. Pt was left sitting up in the wheelchair with all  needs met, chair alarm set, and call bell within reach.   Therapy Documentation Precautions:  Precautions Precautions: Fall (right inattention.) Precaution/Restrictions Comments: R inattention Restrictions Weight Bearing Restrictions Per Provider Order: No  Therapy/Group: Individual Therapy  Nena VEAR Moats 08/05/2024, 7:43 AM

## 2024-08-05 NOTE — Care Management (Signed)
 [facility] Individual Statement of Services  Patient Name:  Christie Meza  Date:  08/05/2024  Welcome to the [facility]patient [facility].  Our goal is to provide you with an individualized program based on your diagnosis and situation, designed to meet your specific needs.  With this comprehensive rehabilitation program, you will be expected to participate in at least 3 hours of rehabilitation therapies Monday-Friday, with modified therapy programming on the weekends.  Your rehabilitation program will include the following services:  Physical Therapy (PT), Occupational Therapy (OT), Speech Therapy (ST), 24 hour per day rehabilitation nursing, Therapeutic Recreaction (TR), Psychology, Neuropsychology, Care Coordinator, Rehabilitation Medicine, Nutrition Services, Pharmacy Services, and Other  Weekly team conferences will be held on Tuesday to discuss your progress.  Your Inpatient Rehabilitation Care Coordinator will talk with you frequently to get your input and to update you on team discussions.  Team conferences with you and your family in attendance may also be held.  Expected length of stay: 7-10 days  Overall anticipated outcome: Supervision  Depending on your progress and recovery, your program may change. Your Inpatient Rehabilitation Care Coordinator will coordinate services and will keep you informed of any changes. Your Inpatient Rehabilitation Care Coordinator's name and contact numbers are listed  below.  The following services may also be recommended but are not provided by the Inpatient [facility]:  Driving Evaluations Home Health Rehabiltiation Services Outpatient Rehabilitation Services Vocational Rehabilitation   Arrangements will be made to provide these services after discharge if needed.  Arrangements include referral to agencies that provide these services.  Your insurance has been verified to be:  [facility] Medicare  Your primary  doctor is:  Aliene Colon  Pertinent information will be shared with your doctor and your insurance company.  Inpatient Rehabilitation Care Coordinator:  Graeme Jude, KEN [phone] or [phone]  Information discussed with and copy given to patient by: Graeme DELENA Jude, 08/05/2024, 10:47 AM

## 2024-08-05 NOTE — Progress Notes (Signed)
 Occupational Therapy Session Note  Patient Details  Name: Christie Meza MRN: [id number] Date of Birth: 1946/08/17  Today's Date: 08/05/2024 OT Individual Time: 1405-1430 OT Individual Time Calculation (min): 25 min    Short Term Goals: Week 1:  OT Short Term Goal 1 (Week 1): STG= LTG d/t ELOS  Skilled Therapeutic Interventions/Progress Updates:    Pt received in bed stating she was cold. The room very warm at 74 degrees and pt had 4 blankets on. Pt declined getting out of bed due to feeling cold but agreeable to working from the bed.    Session focused on R hand coordination and R side awareness/ praxis:  -reaching and grasping toiletry bottles on her R side and placing in a container on her L. Cues to fully scan eyes to hand or to focus on fully opening hand.  Overall pt only needed min cues 30% of the time -B hands together with overhead reaches. - 2 lb dowel bar with hands in either pronation or supination with reaching bar up and out  Pt talked about how much she enjoys cooking. Pt may benefit from B coordination activities that simulate cooking activities (ie stirring a mixing spoon in a bowl)  Pt surprised that she will need help at d/c. Reviewed her S goals and that she will need supervision from her family to ensure her safety.   Pt resting in bed will all needs met and alarm set.   Therapy Documentation Precautions:  Precautions Precautions: Fall (right inattention.) Precaution/Restrictions Comments: R inattention Restrictions Weight Bearing Restrictions Per Provider Order: No    Vital Signs: Therapy Vitals Temp: 98.9 F (37.2 C) Temp Source: Oral Pulse Rate: 100 Resp: 16 BP: 128/75 Patient Position (if appropriate): Sitting Oxygen Therapy SpO2: 100 % O2 Device: Room Air Pain: Pain Assessment Pain Scale: 0-10 Pain Score: 0-No pain ADL: ADL Eating: Minimal assistance Where Assessed-Eating: Edge of bed Grooming: Minimal assistance Where  Assessed-Grooming: Sitting at sink Upper Body Bathing: Moderate assistance Where Assessed-Upper Body Bathing: Sitting at sink Lower Body Bathing: Moderate assistance Where Assessed-Lower Body Bathing: Sitting at sink Upper Body Dressing: Moderate assistance Where Assessed-Upper Body Dressing: Sitting at sink Lower Body Dressing: Moderate assistance Where Assessed-Lower Body Dressing: Sitting at sink Toileting: Minimal assistance Where Assessed-Toileting: Teacher, adult education: Minimal Dentist Method: Stand pivot Tub/Shower Transfer: Unable to assess      Therapy/Group: Individual Therapy  Daneisha Surges 08/05/2024, 3:09 PM

## 2024-08-05 NOTE — Progress Notes (Signed)
 PROGRESS NOTE   Subjective/Complaints:  No acute complaints.  No events overnight.  States she is doing well.  Eating well, sleeping well. Vitals are stable.  A.m. labs stable.  Later, reports repeat fluttering sensation to nursing.  ROS: Denies fevers, chills, N/V, abdominal pain, constipation, diarrhea, SOB, cough, chest pain, new weakness or paraesthesias.   - Heart fluttering sensation  Objective:   No results found. No results for input(s): WBC, HGB, HCT, PLT in the last 72 hours.  Recent Labs    08/03/24 0642  NA 140  K 3.6  CL 108  CO2 20*  GLUCOSE 106*  BUN 11  CREATININE 1.27*  CALCIUM 10.7*    Intake/Output Summary (Last 24 hours) at 08/05/2024 2245 Last data filed at 08/05/2024 0943 Gross per 24 hour  Intake 100 ml  Output --  Net 100 ml        Physical Exam: Vital Signs Blood pressure 128/75, pulse 100, temperature 98.9 F (37.2 C), temperature source Oral, resp. rate 16, height 5' 5 (1.651 m), weight 104.5 kg, SpO2 100%.   General: No acute distress.  Sitting up in wheelchair.  Obese Mood and affect are appropriate Heart: Regular rate and rhythm no rubs murmurs or extra sounds Lungs: Clear to auscultation, breathing unlabored, no rales or wheezes Abdomen: Positive bowel sounds, soft nontender to palpation, nondistended Extremities: No clubbing, cyanosis, or edema Skin: No evidence of breakdown, no evidence of rash    MSK:      No apparent deformity.   Spasticity: MAS 0 in all extremities.       Strength:  + Right upper extremity motor apraxia                RUE: 4/5 SA, 4/5 EF, 4/5 EE, 5/5 WE, 5/5 FF, 5/5 FA                LUE:  5/5 SA, 5/5 EF, 5/5 EE, 5/5 WE, 5/5 FF, 5/5 FA                RLE: 4/5 HF, 5/5 KE, 5/5  DF, 5/5  EHL, 5/5  PF                 LLE:  4-/5 HF, 4/5 KE, 4/5  DF, 4/5  EHL, 4/5  PF    Physical exam unchanged from the above on reexamination 08/05/24     Assessment/Plan: 1. Functional deficits which require 3+ hours per day of interdisciplinary therapy in a comprehensive inpatient rehab setting. Physiatrist is providing close team supervision and 24 hour management of active medical problems listed below. Physiatrist and rehab team continue to assess barriers to discharge/monitor patient progress toward functional and medical goals  Care Tool:  Bathing    Body parts bathed by patient: Right arm, Left arm, Chest, Abdomen, Front perineal area, Right upper leg, Buttocks, Face, Left lower leg, Right lower leg, Left upper leg         Bathing assist Assist Level: Moderate Assistance - Patient 50 - 74%     Upper Body Dressing/Undressing Upper body dressing   What is the patient wearing?: Pull over shirt    Upper  body assist Assist Level: Moderate Assistance - Patient 50 - 74%    Lower Body Dressing/Undressing Lower body dressing      What is the patient wearing?: Underwear/pull up, Pants     Lower body assist Assist for lower body dressing: Moderate Assistance - Patient 50 - 74%     Toileting Toileting    Toileting assist Assist for toileting: Moderate Assistance - Patient 50 - 74%     Transfers Chair/bed transfer  Transfers assist     Chair/bed transfer assist level: Minimal Assistance - Patient > 75%     Locomotion Ambulation   Ambulation assist      Assist level: Minimal Assistance - Patient > 75% Assistive device: No Device Max distance: 40   Walk 10 feet activity   Assist     Assist level: Minimal Assistance - Patient > 75% Assistive device: No Device   Walk 50 feet activity   Assist Walk 50 feet with 2 turns activity did not occur: Safety/medical concerns         Walk 150 feet activity   Assist Walk 150 feet activity did not occur: Safety/medical concerns         Walk 10 feet on uneven surface  activity   Assist Walk 10 feet on uneven surfaces activity did not occur:  Safety/medical concerns         Wheelchair     Assist Is the patient using a wheelchair?: Yes Type of Wheelchair: Manual    Wheelchair assist level: Dependent - Patient 0% Max wheelchair distance: 0 (unable to mobilize w/ RUE.)    Wheelchair 50 feet with 2 turns activity    Assist        Assist Level: Dependent - Patient 0%   Wheelchair 150 feet activity     Assist      Assist Level: Dependent - Patient 0%   Blood pressure 128/75, pulse 100, temperature 98.9 F (37.2 C), temperature source Oral, resp. rate 16, height 5' 5 (1.651 m), weight 104.5 kg, SpO2 100%.  Medical Problem List and Plan: 1. 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  Plan 30-day cardiac event monitor             -patient may shower             -ELOS/Goals: 7-10 days, SPV PT/OT/Min A SLP             -Continue inpatient rehab   2.  Antithrombotics: -DVT/anticoagulation:  Pharmaceutical: Lovenox              -antiplatelet therapy: Plavix alone 75 mg daily due to aspirin  allergy  3. Pain Management chronic pain: Currently on Tylenol  as needed.  Prior to admission patient was taking hydrocodone  5-325 mg 1 tablet every 4 hours as needed as well as Robaxin  every 6 hours as needed muscle spasms.  Resume as needed   - Per patient, pain well-controlled on current regimen  4. Mood/Behavior/Sleep/bipolar disorder: Cymbalta  30 mg daily             -antipsychotic agents: Seroquel  50 mg nightly 5. Neuropsych/cognition: This patient is not capable of making decisions on her own behalf. 6. Skin/Wound Care: Routine skin checks 7. Fluids/Electrolytes/Nutrition: Routine in and outs with follow-up chemistries  - 10-10: Poor p.o. intake this a.m.  Mild AKI.  Albumin low, 3.  Will get dietary eval    - 10-13: No labs today; ordered for tomorrow 8.  Dysphagia.  Dysphagia #1 thin liquids.  Follow-up speech therapy 9.  Hyperlipidemia.  Lipitor 10.  AKI.  Resolved.  Baseline  creatinine 1.2.  Follow-up chemistries 11.  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  Dietary follow-up 12.  History of alcohol use.  Provide counseling 13.  Microcytic anemia.  Baseline hemoglobin 8-10.  Hemoglobin dipping to 7.2.  Currently stable.  Anemia panel consistent with iron deficiency anemia.  Iron supplement added.  - Labs ordered for 10-14  14.  Hypertension.  Cozaar 25 mg daily, Bidil  20-37.5 mg 3 times daily.  Monitor with increased mobility   - Well-controlled on current regimen    08/05/2024   12:40 PM 08/05/2024    5:00 AM 08/05/2024    4:46 AM  Vitals with BMI  Weight  230 lbs 6 oz   BMI  38.34   Systolic 128  127  Diastolic 75  60  Pulse 100  83    15.  Constipation.  No bowel movement since admission.  Is passing flatus.  - Last bowel movement 10-11  16.  Urinary incontinence.  Removed PureWick, monitor PVRs.    17.  AKI.  Mild, encourage p.o. fluids.  Repeat BMP in AM.--None ordered, repeat 10-14  18.  Suspect GERD positional at night  EKG normal add Pecid 10mg  BID - pt notes improvement in symptoms  10-14: Recurrent per nursing.  Vital stable, exam nonfocal.  Self-limited.  LOS: 4 days A FACE TO FACE EVALUATION WAS PERFORMED  Joesph JAYSON Likes 08/05/2024, 10:45 PM

## 2024-08-05 NOTE — Plan of Care (Signed)
  Problem: Consults Goal: RH STROKE PATIENT EDUCATION Description: See Patient Education module for education specifics  Outcome: Progressing   Problem: RH BOWEL ELIMINATION Goal: RH STG MANAGE BOWEL WITH ASSISTANCE Description: STG Manage Bowel with supervision-minimal Assistance. Outcome: Progressing   Problem: RH BLADDER ELIMINATION Goal: RH STG MANAGE BLADDER WITH ASSISTANCE Description: STG Manage Bladder With supervision- minimal Assistance Outcome: Progressing   Problem: RH SKIN INTEGRITY Goal: RH STG SKIN FREE OF INFECTION/BREAKDOWN Description: Manage skin free of infection/breakdown with supervision-minimal assistance Outcome: Progressing   Problem: RH SAFETY Goal: RH STG ADHERE TO SAFETY PRECAUTIONS W/ASSISTANCE/DEVICE Description: STG Adhere to Safety Precautions With supervision- minimal Assistance/Device. Outcome: Progressing   Problem: RH PAIN MANAGEMENT Goal: RH STG PAIN MANAGED AT OR BELOW PT'S PAIN GOAL Description: < 4 w/ prns Outcome: Progressing   Problem: RH KNOWLEDGE DEFICIT Goal: RH STG INCREASE KNOWLEDGE OF HYPERTENSION Description: Manage increase knowledge of hypertension with supervision- minimal assistance from partner / dtr using educational materials provided Outcome: Progressing Goal: RH STG INCREASE KNOWLEDGE OF DYSPHAGIA/FLUID INTAKE Description: Manage increase knowledge of dysphagia with supervision- minimal assistance from partner / dtr using educational materials provided Outcome: Progressing Goal: RH STG INCREASE KNOWLEGDE OF HYPERLIPIDEMIA Description: Manage increase knowledge of hyperlipidemia with supervision- minimal assistance from partner / dtr using educational materials provided Outcome: Progressing Goal: RH STG INCREASE KNOWLEDGE OF STROKE PROPHYLAXIS Description: Manage increase knowledge of stroke prophylaxis  with supervision- minimal assistance from partner / dtr using educational materials provided Outcome: Progressing

## 2024-08-06 LAB — CBC WITH DIFFERENTIAL/PLATELET
Abs Immature Granulocytes: 0.01 K/uL (ref 0.00–0.07)
Basophils Absolute: 0 K/uL (ref 0.0–0.1)
Basophils Relative: 1 %
Eosinophils Absolute: 0.3 K/uL (ref 0.0–0.5)
Eosinophils Relative: 5 %
HCT: 24.5 % — ABNORMAL LOW (ref 36.0–46.0)
Hemoglobin: 7.9 g/dL — ABNORMAL LOW (ref 12.0–15.0)
Immature Granulocytes: 0 %
Lymphocytes Relative: 36 %
Lymphs Abs: 1.8 K/uL (ref 0.7–4.0)
MCH: 24 pg — ABNORMAL LOW (ref 26.0–34.0)
MCHC: 32.2 g/dL (ref 30.0–36.0)
MCV: 74.5 fL — ABNORMAL LOW (ref 80.0–100.0)
Monocytes Absolute: 0.5 K/uL (ref 0.1–1.0)
Monocytes Relative: 10 %
Neutro Abs: 2.5 K/uL (ref 1.7–7.7)
Neutrophils Relative %: 48 %
Platelets: 187 K/uL (ref 150–400)
RBC: 3.29 MIL/uL — ABNORMAL LOW (ref 3.87–5.11)
RDW: 16.5 % — ABNORMAL HIGH (ref 11.5–15.5)
WBC: 5.1 K/uL (ref 4.0–10.5)
nRBC: 0 % (ref 0.0–0.2)

## 2024-08-06 LAB — BASIC METABOLIC PANEL WITH GFR
Anion gap: 10 (ref 5–15)
BUN: 9 mg/dL (ref 8–23)
CO2: 22 mmol/L (ref 22–32)
Calcium: 10.5 mg/dL — ABNORMAL HIGH (ref 8.9–10.3)
Chloride: 109 mmol/L (ref 98–111)
Creatinine, Ser: 1.12 mg/dL — ABNORMAL HIGH (ref 0.44–1.00)
GFR, Estimated: 51 mL/min — ABNORMAL LOW (ref >60)
Glucose, Bld: 104 mg/dL — ABNORMAL HIGH (ref 70–99)
Potassium: 3.5 mmol/L (ref 3.5–5.1)
Sodium: 141 mmol/L (ref 135–145)

## 2024-08-06 LAB — MAGNESIUM: Magnesium: 1.7 mg/dL (ref 1.7–2.4)

## 2024-08-06 LAB — PHOSPHORUS: Phosphorus: 3 mg/dL (ref 2.5–4.6)

## 2024-08-06 MED ORDER — CALCIUM CARBONATE ANTACID 500 MG PO CHEW
1.0000 | CHEWABLE_TABLET | Freq: Three times a day (TID) | ORAL | Status: DC
Start: 2024-08-07 — End: 2024-08-08
  Administered 2024-08-07 – 2024-08-08 (×4): 200 mg via ORAL
  Filled 2024-08-06 (×4): qty 1

## 2024-08-06 MED ORDER — FAMOTIDINE 20 MG PO TABS
20.0000 mg | ORAL_TABLET | Freq: Two times a day (BID) | ORAL | Status: DC
  Administered 2024-08-07 – 2024-08-08 (×3): 20 mg via ORAL
  Filled 2024-08-06 (×3): qty 1

## 2024-08-06 NOTE — Progress Notes (Signed)
 PROGRESS NOTE   Subjective/Complaints:  Vitals stable. No complaints. Ready to work with therapies today.  Remain incontinent bladder PO intakes slowing down, only 20% last few meals--endorsing getting food brought in from outside hospital including chocolate cake.   ROS: Denies fevers, chills, N/V, abdominal pain, constipation, diarrhea, SOB, cough, chest pain, new weakness or paraesthesias.   - Heart fluttering sensation  Objective:   No results found. Recent Labs    08/06/24 0512  WBC 5.1  HGB 7.9*  HCT 24.5*  PLT 187    Recent Labs    08/06/24 0512  NA 141  K 3.5  CL 109  CO2 22  GLUCOSE 104*  BUN 9  CREATININE 1.12*  CALCIUM 10.5*    Intake/Output Summary (Last 24 hours) at 08/06/2024 0949 Last data filed at 08/06/2024 0743 Gross per 24 hour  Intake 50 ml  Output --  Net 50 ml        Physical Exam: Vital Signs Blood pressure (!) 113/55, pulse 82, temperature 98.2 F (36.8 C), resp. rate 18, height 5' 5 (1.651 m), weight 106.1 kg, SpO2 97%.   General: No acute distress.  Sitting up in wheelchair.  Obese Mood and affect are appropriate Heart: Regular rate and rhythm no rubs murmurs or extra sounds Lungs: Clear to auscultation, breathing unlabored, no rales or wheezes Abdomen: Positive bowel sounds, soft nontender to palpation, nondistended Extremities: No clubbing, cyanosis, or edema Skin: No evidence of breakdown, no evidence of rash    MSK:      No apparent deformity.   Spasticity: MAS 0 in all extremities.       Strength:  + Right upper extremity motor apraxia                RUE: 4/5 SA, 4/5 EF, 4/5 EE, 5/5 WE, 5/5 FF, 5/5 FA                LUE:  5/5 SA, 5/5 EF, 5/5 EE, 5/5 WE, 5/5 FF, 5/5 FA                RLE: 4/5 HF, 5/5 KE, 5/5  DF, 5/5  EHL, 5/5  PF                 LLE:  4-/5 HF, 4/5 KE, 4/5  DF, 4/5  EHL, 4/5  PF    Physical exam unchanged from the above on reexamination  08/06/24    Assessment/Plan: 1. Functional deficits which require 3+ hours per day of interdisciplinary therapy in a comprehensive inpatient rehab setting. Physiatrist is providing close team supervision and 24 hour management of active medical problems listed below. Physiatrist and rehab team continue to assess barriers to discharge/monitor patient progress toward functional and medical goals  Care Tool:  Bathing    Body parts bathed by patient: Right arm, Left arm, Chest, Abdomen, Front perineal area, Right upper leg, Buttocks, Face, Left lower leg, Right lower leg, Left upper leg         Bathing assist Assist Level: Moderate Assistance - Patient 50 - 74%     Upper Body Dressing/Undressing Upper body dressing   What is the patient wearing?:  Pull over shirt    Upper body assist Assist Level: Moderate Assistance - Patient 50 - 74%    Lower Body Dressing/Undressing Lower body dressing      What is the patient wearing?: Underwear/pull up, Pants     Lower body assist Assist for lower body dressing: Moderate Assistance - Patient 50 - 74%     Toileting Toileting    Toileting assist Assist for toileting: Moderate Assistance - Patient 50 - 74%     Transfers Chair/bed transfer  Transfers assist     Chair/bed transfer assist level: Minimal Assistance - Patient > 75%     Locomotion Ambulation   Ambulation assist      Assist level: Minimal Assistance - Patient > 75% Assistive device: No Device Max distance: 40   Walk 10 feet activity   Assist     Assist level: Minimal Assistance - Patient > 75% Assistive device: No Device   Walk 50 feet activity   Assist Walk 50 feet with 2 turns activity did not occur: Safety/medical concerns         Walk 150 feet activity   Assist Walk 150 feet activity did not occur: Safety/medical concerns         Walk 10 feet on uneven surface  activity   Assist Walk 10 feet on uneven surfaces activity did not  occur: Safety/medical concerns         Wheelchair     Assist Is the patient using a wheelchair?: Yes Type of Wheelchair: Manual    Wheelchair assist level: Dependent - Patient 0% Max wheelchair distance: 0 (unable to mobilize w/ RUE.)    Wheelchair 50 feet with 2 turns activity    Assist        Assist Level: Dependent - Patient 0%   Wheelchair 150 feet activity     Assist      Assist Level: Dependent - Patient 0%   Blood pressure (!) 113/55, pulse 82, temperature 98.2 F (36.8 C), resp. rate 18, height 5' 5 (1.651 m), weight 106.1 kg, SpO2 97%.  Medical Problem List and Plan: 1. 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  Plan 30-day cardiac event monitor             -patient may shower             -ELOS/Goals: 7-10 days, SPV PT/OT/Min A SLP - 10/18 DC date             -Continue inpatient rehab    - 10/14: Plan to go home with daughter; CGA ADLs and trasnfers with RW; ataxic and apraxic RUE. CGA with bathing/dressing. CG for 40 ft with RW; therapy tolerance poor (refusing therapies frequently). Upgraded to dys 2 diet yesterday. Unsure if 24/7 at home but she is not impulsive.   2.  Antithrombotics: -DVT/anticoagulation:  Pharmaceutical: Lovenox              -antiplatelet therapy: Plavix alone 75 mg daily due to aspirin  allergy  3. Pain Management chronic pain: Currently on Tylenol  as needed.  Prior to admission patient was taking hydrocodone  5-325 mg 1 tablet every 4 hours as needed as well as Robaxin  every 6 hours as needed muscle spasms.  Resume as needed   - Per patient, pain well-controlled on current regimen  4. Mood/Behavior/Sleep/bipolar disorder: Cymbalta  30 mg daily, Seroquel  25 mg QHS, and clonidine  0.1 mg BID at baseline             -antipsychotic agents:  Seroquel  50 mg nightly   - 10/14: sleeping well;   5. Neuropsych/cognition: This patient is not capable of making decisions on her own behalf. 6. Skin/Wound Care:  Routine skin checks   - Buttocks/sacral MASD  7. Fluids/Electrolytes/Nutrition: Routine in and outs with follow-up chemistries  - 10-10: Poor p.o. intake this a.m.  Mild AKI.  Albumin low, 3.  Will get dietary eval    - 10-13: No labs today; ordered for tomorrow--improved  8.  Dysphagia.  Dysphagia #1 thin liquids.  Follow-up speech therapy 9.  Hyperlipidemia.  Lipitor 10.  AKI.  Resolved.  Baseline creatinine 1.2.  Follow-up chemistries   - Mild, encourage p.o. fluids.  Repeat BMP in AM.--None ordered, repeat 10-14   - Improving 10/14; trend 11.  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  Dietary follow-up 12.  History of alcohol use.  Provide counseling 13.  Microcytic anemia.  Baseline hemoglobin 8-10.  Hemoglobin dipping to 7.2.  Currently stable.  Anemia panel consistent with iron deficiency anemia.  Iron supplement added.  - Labs ordered for 10-14--stable  14.  Hypertension.  Cozaar 25 mg daily, Bidil  20-37.5 mg 3 times daily.  Monitor with increased mobility   - Well-controlled on current regimen    08/06/2024    6:49 AM 08/06/2024    4:03 AM 08/05/2024   12:40 PM  Vitals with BMI  Weight 233 lbs 15 oz    BMI 38.92    Systolic  113 128  Diastolic  55 75  Pulse  82 100    15.  Constipation.  No bowel movement since admission.  Is passing flatus.  - Last bowel movement 10-11  16.  Urinary incontinence.  Noted since 2023. Removed PureWick, monitor PVRs.   - Patient denies sensation to urinate; chronic. Timed toiletting.    - 10/14: PVRs low; continent this PM, monitor  17.  Suspect GERD positional at night  EKG normal add Pecid 10mg  BID - pt notes improvement in symptoms  10-14: Recurrent per nursing.  Vital stable, exam nonfocal.  Self-limited. Increase prilosec to 20 mg BID, add tums TID AC  LOS: 5 days A FACE TO FACE EVALUATION WAS PERFORMED  Christie Meza Likes 08/06/2024, 9:49 AM

## 2024-08-06 NOTE — Patient Care Conference (Signed)
 Inpatient RehabilitationTeam Conference and Plan of Care Update Date: 08/06/2024   Time: 1018 am    Patient Name: Christie Meza      Medical Record Number: [id number]  Date of Birth: May 01, 1946 Sex: Female         Room/Bed: 4W19C/4W19C-01 Payor Info: Payor: [employer] MEDICARE / Plan: [facility] MEDICARE / Product Type: *No Product type* /    Admit Date/Time:  08/01/2024  6:22 PM  Primary Diagnosis:  Left middle cerebral artery stroke [facility])  Hospital Problems: Principal Problem:   Left middle cerebral artery stroke [facility])    Expected Discharge Date: Expected Discharge Date: 08/10/24  Team Members Present: Physician leading conference: Dr. Joesph Likes Social Worker Present: Graeme Jude, LCSW Nurse Present: Eulalio Falls, RN PT Present: Kirt Dawn, PT OT Present: Nena Moats, OT SLP Present: Recardo Mole, SLP PPS Coordinator present : Eleanor Colon, SLP     Current Status/Progress Goal Weekly Team Focus  Bowel/Bladder   has periods of incontinence of bladder; last bm 08/04/23   no skin breakdown related to incontinence   continue to assess skin to avoid breakdown; encourage toileting    Swallow/Nutrition/ Hydration   dys 2/thin - can likely upgrade to Dys 3 soon   supervision  pt/family education, PO trials, diet tolerance    ADL's   CGA with all ADLs and transfers with the RW. Poor awareness and cueing required for RW management. Poor R attention. RUE is ataxic and apraxic   Supervision overall   ADL retraining, transfers, RUE NMR, R attention    Mobility   CGA transfers and ambulation x40' with RW   Supervision  DC prep, balance, Rt hemibody NMR, endurance    Communication   moderate aphasia   minA   pt/family education, functional language tasks    Safety/Cognition/ Behavioral Observations               Pain   no pain noted this shift   continue with minimal to no pain   assess for pain each shift and prn    Skin   no skin breakdown   MASD buttocks encourage repostioning q2h  continue to assess skin each shift      Discharge Planning:  Pt will disharge to home with her dtr. Fam edu on Thursday (10/16) 9am-12pm with dtr. SW will confirm there are no barriers to discharge.    Team Discussion: Patient was admitted post left MCA infarct. Patient progress limited by poor awareness, poor attention, RUE ataxia, poor endurance and  moderate aphasia. Patient with self limiting behaviors refusing therapy. Patient with poor po intake.   Patient on target to meet rehab goals: Currently patient needs CGA with all ADLs, transfers and ambulation. Patient was able to ambulate up to 40' using a RW. Overall goals at discharge are set for supervision assistance and min assistance for communication.   *See Care Plan and progress notes for long and short-term goals.   Revisions to Treatment Plan:  Encourage fluid intake  Calorie counting Dietary consult Upgraded to Dysphagia 2 diet Stedy for transfers  Teaching Needs: Safety, medications, dietary modifications, transfers, toileting, etc.   Current Barriers to Discharge: Decreased caregiver support, Home enviroment access/layout, and Behavior  Possible Resolutions to Barriers:  Family Education Home health follow up     Medical Summary Current Status: medically complicated by cognitive deficits, heart fluttering sensaiton, decrased PO intakes/dysphagia, anemia,  constipation, and left inattention  Barriers to Discharge: Behavior/Mood;Cardiac Complications;Self-care education;Symptomatic Anemia;Morbid Obesity;Medical stability;Incontinence;Inadequate Nutritional  Intake   Possible Resolutions to [employer]: encourage PO fluids, workup for heart fluttering sensation (cardiac negative this far), monitor anemia, titrate bowel medicaitons   Continued Need for Acute Rehabilitation Level of Care: The patient requires daily medical management by a physician with specialized  training in physical medicine and rehabilitation for the following reasons: Direction of a multidisciplinary physical rehabilitation program to maximize functional independence : Yes Medical management of patient stability for increased activity during participation in an intensive rehabilitation regime.: Yes Analysis of laboratory values and/or radiology reports with any subsequent need for medication adjustment and/or medical intervention. : Yes   I attest that I was present, lead the team conference, and concur with the assessment and plan of the team.   Dayne Chait Gayo 08/06/2024, 1018 am

## 2024-08-06 NOTE — Progress Notes (Signed)
 Physical Therapy Session Note  Patient Details  Name: Christie Meza MRN: [id number] Date of Birth: [birth year]  Today's Date: 08/06/2024 PT Individual Time: 1300-1340 PT Individual Time Calculation (min): 40 min   Short Term Goals: Week 1:  PT Short Term Goal 1 (Week 1): STG=LTG 2/2 ELOS  Skilled Therapeutic Interventions/Progress Updates:     Pt received seated in recliner and agrees to therapy. No complaint of pain. Pt performs sit to stand with CGA and cues for hand placement and sequencing. Pt ambulates x15' to [facility] with minA for safe AD management and sequencing of turn to sit in [facility]. Following rest break, pt stands and ambulates x75' with RW and CGA/minA, with light facilitation of RW management as well as cueing for sequencing of transition back to [facility]. Cues also provided to reach back for [facility] to assist with eccentric control of stand to sit for safety and strengthening. Following extended seated rest break, pt ambulates additional x90' with RW and similar cues, also requiring frequent cueing to adjust hand placement of RUE, as her hand tends to shift forward on RW. Pt handed off to OT following session.  Therapy Documentation Precautions:  Precautions Precautions: Fall (right inattention.) Precaution/Restrictions Comments: R inattention Restrictions Weight Bearing Restrictions Per Provider Order: No    Therapy/Group: Individual Therapy  Elsie JAYSON Dawn, PT, DPT 08/06/2024, 4:30 PM

## 2024-08-06 NOTE — Progress Notes (Signed)
 No void all night; stated she just wanted to sleep; bladder scanned for 193 ml.

## 2024-08-06 NOTE — Progress Notes (Signed)
 Speech Language Pathology Daily Session Note  Patient Details  Name: Christie Meza MRN: [id number] Date of Birth: 23-Aug-1946  Today's Date: 08/06/2024 SLP Individual Time: 1400-1500 SLP Individual Time Calculation (min): 60 min  Short Term Goals: Week 1: SLP Short Term Goal 1 (Week 1): STGs=LTGs d/t ELOS  Skilled Therapeutic Interventions: SLP conducted skilled therapy session targeting communication goals. SLP offered to assess patient with upgraded diet tolerance (Dys3), however patient endorses she is not hungry and therefore forewent trials this date. Recommend continuation of Dys2 diet at this time. SLP then facilitated functional environmental language tasks. Patient engaged in schedule interpretation, benefiting from mod to max assist to identify time and therapist associated with each session. During session, patient communicated basic environmental needs such as need for increase in room temperature, but required min assist to provide appropriate context to increase comprehensibility of communicative intent. SLP conducted responsive naming task where patient required min to mod assist with extra processing time to name items accurately. Productions were often influenced by semantic paraphasias, but self-correction skills are emerging. Patient then completed divergent naming task with min-modA, and phrase completion of min assist. Patient was left in room with call bell in reach and alarm set. SLP will continue to target goals per plan of care.    Pain Pain Assessment Pain Scale: 0-10 Pain Score: 0-No pain  Therapy/Group: Individual Therapy  Char Feltman, M.A., CCC-SLP  Baird Polinski A Revella Shelton 08/06/2024, 3:15 PM

## 2024-08-06 NOTE — Progress Notes (Signed)
 Physical Therapy Session Note  Patient Details  Name: Christie Meza MRN: [id number] Date of Birth: 12-Dec-1945  Today's Date: 08/06/2024 PT Individual Time: 9145-9054 PT Individual Time Calculation (min): 51 min   Short Term Goals: Week 1:  PT Short Term Goal 1 (Week 1): STG=LTG 2/2 ELOS  Skilled Therapeutic Interventions/Progress Updates:    Pt presents in bed initially not very agreeable to therapy stating she just wanted to rest. Discussed her home routine and tried to encourage incorporating that into her routine here/engaging in therapies. With a lot of encouragement and education, pt agreeable to OOB. S to come to EOB with extra time and then slightly impulsive to get OOB without device with close S/CGA and then ambulating without device to w/c with CGA. Transported to gym via w/c for time management. Focused on functional gait training with Rw with CGA overall and focus on R attention (obstacles, as well as RUE management on RW for positioning) x 40' x 4 continuously including turning for functional mobility retraining and overall strengthening/endurance. Focused on fine motor tasks and attention with RUE for handwriting (able to legibly write with extra time her name x 1 and other time more difficulty with grasp noted) and then manipulating squiggz on mirror to pull off and put on. Returned to room and transferred ambulatory with RW to the recliner and set up with all needs in reach.   Therapy Documentation Precautions:  Precautions Precautions: Fall (right inattention.) Precaution/Restrictions Comments: R inattention Restrictions Weight Bearing Restrictions Per Provider Order: No    Pain: Denies pain.   Therapy/Group: Individual Therapy  [name] [name] [name] Elnor, PT, DPT, CBIS  08/06/2024, 11:08 AM

## 2024-08-06 NOTE — Progress Notes (Signed)
 Occupational Therapy Session Note  Patient Details  Name: Christie Meza MRN: [id number] Date of Birth: 02-17-1946  Today's Date: 08/06/2024 OT Individual Time: 1341-1402 OT Individual Time Calculation (min): 21 min    Short Term Goals: Week 1:  OT Short Term Goal 1 (Week 1): STG= LTG d/t ELOS  Skilled Therapeutic Interventions/Progress Updates:  Pt greeted from direct PT handoff for session focused on functional transfers and RUE NMR. Pt dependently transported back to room in [facility], performing stand-step transfer back to EOB with CGA+RW. Sit>supine with light Min A for BLE elevation. Seated in bed, patient engaged in RUE [facility] activity with use of small pegboard. Patient able to pull pegs off of board using modified pincer grasp, unable to place pegs into board due to decreased dexterity. Pt then retrieves/transports pegs from lap to container placed on R-side with similar grasp. Activity upgraded by having patient retrieve/hold/transport multiple pegs of a specific color at once. Increased spillage noted initially decreasing with repetitions, continues to be limited by proprioception deficits. Pt remained resting in bed with SLP present for direct handoff.   Therapy Documentation Precautions:  Precautions Precautions: Fall (right inattention.) Precaution/Restrictions Comments: R inattention Restrictions Weight Bearing Restrictions Per Provider Order: No   Therapy/Group: Individual Therapy  Nereida Habermann, OTR/L, MSOT  08/06/2024, 4:21 PM

## 2024-08-06 NOTE — Progress Notes (Signed)
 Occupational Therapy Session Note  Patient Details  Name: Christie Meza MRN: [id number] Date of Birth: 1946/03/22  Today's Date: 08/06/2024 OT Individual Time: 1120-1200 OT Individual Time Calculation (min): 40 min   Short Term Goals: Week 1:  OT Short Term Goal 1 (Week 1): STG= LTG d/t ELOS  Skilled Therapeutic Interventions/Progress Updates:   Pt received sitting in the recliner with no c/o pain. She was reluctant to participate but agreeable with encouragement. She stood from the recliner with CGA and completed 10 ft functional mobility to the w/c with CGA, requiring min cueing for R attention. Pt was taken via w/c to the therapy gym for time management. She completed functional reaching and sequencing activity using the BITS for RUE NMR- reaching in open chain with CGA. She had a very difficult time sequencing basic numerical order, 1-25- 38% accurate with max cueing and extra time required. She then completed R UE functional reach with cones seated, with target placed in far R visual field to challenge R attention. She returned to her room following. She completed 5 ft of functional mobility with min HHA back to the recliner. Pt was left sitting up in the recliner with all needs met, chair alarm set, and call bell within reach.   Therapy Documentation Precautions:  Precautions Precautions: Fall (right inattention.) Precaution/Restrictions Comments: R inattention Restrictions Weight Bearing Restrictions Per Provider Order: No  Therapy/Group: Individual Therapy  Nena VEAR Moats 08/06/2024, 8:21 AM

## 2024-08-06 NOTE — Progress Notes (Addendum)
 Patient ID: REAGYN FACEMIRE, female   DOB: [birth year], 78 y.o.   MRN: [id number]  SW met with pt in room to provide updates from team conference,and d/c date 10/18. SW discussed with pt preferred HHA. Reports to speak with her dtr.   1523-SW spoke with pt dtr [name] to discuss above. She reports pt is not going home with her but has been offered. States she has extended the offer but she will be home with her boyfriend, so she will not be alone. States she will come by to check in on them. States she hurt her knee, so she is not able to do as much. She is concerned about her mother cooking and told her not to do this. Otherwise, not too many concerns. No HHA preference.   SW sent HHPT/OT/SLP/aide referral to Cindie/Cory with [facility] and waiting on follow-up.    *referral accepted.   Graeme Jude, MSW, LCSW Office: [phone] Cell: [phone] Fax: [phone]

## 2024-08-07 MED ORDER — SENNOSIDES-DOCUSATE SODIUM 8.6-50 MG PO TABS
1.0000 | ORAL_TABLET | Freq: Two times a day (BID) | ORAL | Status: DC
  Administered 2024-08-08 – 2024-08-10 (×5): 1 via ORAL
  Filled 2024-08-07 (×6): qty 1

## 2024-08-07 NOTE — Progress Notes (Signed)
 Pt did not void all shift; spoke to resident in the beginning of shift regarding her not voiding at night. She states she doesn't pee at night. Patient was checked throughout shift to encourage voiding. Bladder scan completed at the end of shift and the results were 252 ml. No bladder distention or distress noted.

## 2024-08-07 NOTE — Progress Notes (Signed)
 Physical Therapy Session Note  Patient Details  Name: Christie Meza MRN: [id number] Date of Birth: 04-29-1946  Today's Date: 08/07/2024 PT Individual Time: 0905-1000 PT Individual Time Calculation (min): 55 min   Short Term Goals: Week 1:  PT Short Term Goal 1 (Week 1): STG=LTG 2/2 ELOS  Skilled Therapeutic Interventions/Progress Updates:     Pt received seated at EOB and agrees to therapy. No complaint of pain. Pt requires minA to stand form EBO with facilitation of anterior weight shifting. Pt then ambulates x130' to gym with CGA and cues for attention to RUE due to hand sliding forward on RW, as well as maintaining upright gaze to improve posture and balance. Seated rest break. Pt performs standing balance activity to challenge coordination, tasked with performing alternating foot taps on 4 step with mirror provided for visual feedback. PT provides RUE and minA at trunk to promote upright posture and lateral weight shifting. Pt completes 4x10 with extended seated rest breaks between each bout. Pt then ambulates x130' back to room with RW and CGA. Left seated with all needs within reach.   Therapy Documentation Precautions:  Precautions Precautions: Fall (right inattention.) Precaution/Restrictions Comments: R inattention Restrictions Weight Bearing Restrictions Per Provider Order: No   Therapy/Group: Individual Therapy  Elsie JAYSON Dawn, PT, DPT 08/07/2024, 4:40 PM

## 2024-08-07 NOTE — Progress Notes (Signed)
 PROGRESS NOTE   Subjective/Complaints:  Vitals stable. No complaints.  Working with therapies, PT this a.m.  Feeling good ambulating, no concerns.   ROS: Denies fevers, chills, N/V, abdominal pain, constipation, diarrhea, SOB, cough, chest pain, new weakness or paraesthesias.   - Heart fluttering sensation--no complaints 10-15  Objective:   No results found. Recent Labs    08/06/24 0512  WBC 5.1  HGB 7.9*  HCT 24.5*  PLT 187    Recent Labs    08/06/24 0512  NA 141  K 3.5  CL 109  CO2 22  GLUCOSE 104*  BUN 9  CREATININE 1.12*  CALCIUM 10.5*    Intake/Output Summary (Last 24 hours) at 08/07/2024 2341 Last data filed at 08/07/2024 2103 Gross per 24 hour  Intake 720 ml  Output 400 ml  Net 320 ml        Physical Exam: Vital Signs Blood pressure 136/66, pulse 73, temperature 98 F (36.7 C), resp. rate 18, height 5' 5 (1.651 m), weight 106.1 kg, SpO2 99%.   General: No acute distress.  Ambulating with PT. Mood and affect are appropriate Heart: Regular rate and rhythm no rubs murmurs or extra sounds Lungs: Clear to auscultation, breathing unlabored, no rales or wheezes Abdomen: Positive bowel sounds, soft nontender to palpation, nondistended Extremities: No clubbing, cyanosis, or edema Skin: No evidence of breakdown, no evidence of rash    MSK:      No apparent deformity.   Spasticity: MAS 0 in all extremities.       Strength:  + Right upper extremity motor apraxia  Gait: Ambulating with a rolling walker at a contact-guard assist level, slowed gait with shuffling pattern but stable stance and stride.  Requiring cueing for upright posture.   Physical exam unchanged from the above on reexamination 08/07/24    Assessment/Plan: 1. Functional deficits which require 3+ hours per day of interdisciplinary therapy in a comprehensive inpatient rehab setting. Physiatrist is providing close team  supervision and 24 hour management of active medical problems listed below. Physiatrist and rehab team continue to assess barriers to discharge/monitor patient progress toward functional and medical goals  Care Tool:  Bathing    Body parts bathed by patient: Right arm, Left arm, Chest, Abdomen, Front perineal area, Right upper leg, Buttocks, Face, Left lower leg, Right lower leg, Left upper leg         Bathing assist Assist Level: Minimal Assistance - Patient > 75%     Upper Body Dressing/Undressing Upper body dressing   What is the patient wearing?: Pull over shirt    Upper body assist Assist Level: Minimal Assistance - Patient > 75%    Lower Body Dressing/Undressing Lower body dressing      What is the patient wearing?: Underwear/pull up, Pants     Lower body assist Assist for lower body dressing: Minimal Assistance - Patient > 75%     Toileting Toileting    Toileting assist Assist for toileting: Minimal Assistance - Patient > 75%     Transfers Chair/bed transfer  Transfers assist     Chair/bed transfer assist level: Contact Guard/Touching assist     Locomotion Ambulation   Ambulation assist  Assist level: Contact Guard/Touching assist Assistive device: Walker-rolling (hand orthosis) Max distance: 150'   Walk 10 feet activity   Assist     Assist level: Contact Guard/Touching assist Assistive device: Walker-rolling, Orthosis   Walk 50 feet activity   Assist Walk 50 feet with 2 turns activity did not occur: Safety/medical concerns  Assist level: Contact Guard/Touching assist Assistive device: Walker-rolling, Orthosis    Walk 150 feet activity   Assist Walk 150 feet activity did not occur: Safety/medical concerns  Assist level: Contact Guard/Touching assist Assistive device: Walker-rolling, Orthosis    Walk 10 feet on uneven surface  activity   Assist Walk 10 feet on uneven surfaces activity did not occur: Safety/medical  concerns   Assist level: Contact Guard/Touching assist Assistive device: Walker-rolling, Orthosis   Wheelchair     Assist Is the patient using a wheelchair?: Yes Type of Wheelchair: Manual    Wheelchair assist level: Dependent - Patient 0% Max wheelchair distance: 0 (unable to mobilize w/ RUE.)    Wheelchair 50 feet with 2 turns activity    Assist        Assist Level: Dependent - Patient 0%   Wheelchair 150 feet activity     Assist      Assist Level: Dependent - Patient 0%   Blood pressure 136/66, pulse 73, temperature 98 F (36.7 C), resp. rate 18, height 5' 5 (1.651 m), weight 106.1 kg, SpO2 99%.  Medical Problem List and Plan: 1. 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  Plan 30-day cardiac event monitor             -patient may shower             -ELOS/Goals: 7-10 days, SP[facility] PT/OT/Min A SLP - 10/18 DC date             -Continue inpatient rehab    - 10/14: Plan to go home with daughter; CGA ADLs and trasnfers with RW; ataxic and apraxic RUE. CGA with bathing/dressing. CG for 40 ft with RW; therapy tolerance poor (refusing therapies frequently). Upgraded to dys 2 diet yesterday. Unsure if 24/7 at home but she is not impulsive.   2.  Antithrombotics: -DVT/anticoagulation:  Pharmaceutical: Lovenox              -antiplatelet therapy: Plavix alone 75 mg daily due to aspirin  allergy  3. Pain Management chronic pain: Currently on Tylenol  as needed.  Prior to admission patient was taking hydrocodone  5-325 mg 1 tablet every 4 hours as needed as well as Robaxin  every 6 hours as needed muscle spasms.  Resume as needed   - Per patient, pain well-controlled on current regimen  4. Mood/Behavior/Sleep/bipolar disorder: Cymbalta  30 mg daily, Seroquel  25 mg QHS, and clonidine  0.1 mg BID at baseline             -antipsychotic agents: Seroquel  50 mg nightly   - 10/14: sleeping well; continue current regimen; can resume clonidine  as outpatient if  needed  5. Neuropsych/cognition: This patient is not capable of making decisions on her own behalf. 6. Skin/Wound Care: Routine skin checks   - Buttocks/sacral MASD  7. Fluids/Electrolytes/Nutrition: Routine in and outs with follow-up chemistries  - 10-10: Poor p.o. intake this a.m.  Mild AKI.  Albumin low, 3.  Will get dietary eval    - 10-13: No labs today; ordered for tomorrow--improved  8.  Dysphagia.  Dysphagia #1 thin liquids.  Follow-up speech therapy 9.  Hyperlipidemia.  Lipitor 10.  AKI.  Resolved.  Baseline creatinine 1.2.  Follow-up chemistries   - Mild, encourage p.o. fluids.  Repeat BMP in AM.--None ordered, repeat 10-14   - Improving 10/14; trend 11.  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  Dietary follow-up 12.  History of alcohol use.  Provide counseling 13.  Microcytic anemia.  Baseline hemoglobin 8-10.  Hemoglobin dipping to 7.2.  Currently stable.  Anemia panel consistent with iron deficiency anemia.  Iron supplement added.  - Labs ordered for 10-14--stable  14.  Hypertension.  Cozaar 25 mg daily, Bidil  20-37.5 mg 3 times daily.  Monitor with increased mobility   - Well-controlled on current regimen    08/07/2024    7:30 PM 08/07/2024    1:07 PM 08/07/2024    5:57 AM  Vitals with BMI  Systolic 136 135 862  Diastolic 66 62 71  Pulse 73 90 89    15.  Constipation.  No bowel movement since admission.  Is passing flatus.  - Last bowel movement 10-11--patient denies any feelings of constipation.  - 10-15: Schedule Senokot S 1 tab twice daily  16.  Urinary incontinence.  Noted since 2023. Removed PureWick, monitor PVRs.   - Patient denies sensation to urinate; chronic. Timed toiletting.    - 10/14: PVRs low; continent this PM, monitor--all continent 10-15  17.  Suspect GERD positional at night  EKG normal add Pecid 10mg  BID - pt notes improvement in symptoms  10-14: Recurrent per nursing.  Vital stable, exam nonfocal.  Self-limited. Increase prilosec to 20 mg BID, add  tums TID AC  No complaints 10-15  LOS: 6 days A FACE TO FACE EVALUATION WAS PERFORMED  Joesph JAYSON Likes 08/07/2024, 11:41 PM

## 2024-08-07 NOTE — Progress Notes (Signed)
 Occupational Therapy Session Note  Patient Details  Name: Christie Meza MRN: [id number] Date of Birth: May 15, 1946  Today's Date: 08/07/2024 OT Individual Time: 8962-8882 OT Individual Time Calculation (min): 40 min    Short Term Goals: Week 1:  OT Short Term Goal 1 (Week 1): STG= LTG d/t ELOS  Skilled Therapeutic Interventions/Progress Updates:  Pt greeted seated in recliner, pt agreeable to OT intervention but reports,  all I do is exercise. Pt requested to stay in the room.    Therapeutic activity:  Pt completed seated bimanual task with a focus on bilateral integration and RUE FMC. Pt instructed to grasp beads with RUE and thread beads on string with LUE holding string. Pt needed MAX multimodal cues to initially sequence task with pt then able to complete task with + time but supervision overall.   Pt completed simulated pill box task pt instructed to use RUE to grasp beads and place in holes on pop it. Pt seems to have decreased sensation in R hand with pt noted to drop beads in R hand and not realize it. Education provided on compensating with vision to make sure pt is actually grasping beads. Pt reports having readers at home. Pt completed task with supervision but + time, reporting task to be aggravating. Recommended pt have supervision to set- up weekly pill box.                 Ended session with pt seated in recliner with all needs within reach and chair alarm activated.                    Therapy Documentation Precautions:  Precautions Precautions: Fall (right inattention.) Precaution/Restrictions Comments: R inattention Restrictions Weight Bearing Restrictions Per Provider Order: No  Pain: No pain    Therapy/Group: Individual Therapy  [name] Mallie Needy 08/07/2024, 12:15 PM

## 2024-08-07 NOTE — Progress Notes (Signed)
 Occupational Therapy Session Note  Patient Details  Name: Christie Meza MRN: [id number] Date of Birth: [birth year]  Today's Date: 08/07/2024 OT Individual Time: 1120-1200 OT Individual Time Calculation (min): 40 min    Short Term Goals: Week 1:  OT Short Term Goal 1 (Week 1): STG= LTG d/t ELOS  Skilled Therapeutic Interventions/Progress Updates:   Pt greeted sitting in recliner with no reports of pain. Session focused on functional transfers and RUE [facility]. OT adapts RW with saddled splint (adding coban to shape for increased input) in attempts to address RUE sensation deficits, minimum success throughout mobility, continuing to require Mod-Max cuing for attention to limb. OT sets up walk-in shower transfer, patient completing with CGA + RW stepping backward into shower. Increased difficulty with RW management/safety, patient picking up walker and pushing too far in front. Min A (HHA) provided on second trial to assess need for AD. Session ended with [facility] activity as patient tasked with stacking wooden blocks vertically, lining wooden blocks horizontally, and picking up wooden blocks (post lining) without knocking others down. Pt remained sitting in recliner, posey belt activated, immediate needs met.   Therapy Documentation Precautions:  Precautions Precautions: Fall (right inattention.) Precaution/Restrictions Comments: R inattention Restrictions Weight Bearing Restrictions Per Provider Order: No   Therapy/Group: Individual Therapy  Nereida Habermann, OTR/L, MSOT  08/07/2024, 11:22 AM

## 2024-08-07 NOTE — Progress Notes (Signed)
 Occupational Therapy Session Note  Patient Details  Name: Christie Meza MRN: [id number] Date of Birth: 06-28-1946  Session 1 Today's Date: 08/07/2024 OT Individual Time: 8695-8598 OT Individual Time Calculation (min): 57 min    Session 2-  Make up time Today's Date: 08/07/2024 OT Individual Time: 1428-1501 OT Individual Time Calculation (min): 33 min    Short Term Goals: Week 1:  OT Short Term Goal 1 (Week 1): STG= LTG d/t ELOS  Skilled Therapeutic Interventions/Progress Updates:    Pt received sitting up with no c/o pain, agreeable to OT session, requesting shower. She completed functional mobility into the bathroom using the RW, trialing R orthosis to promote R attention to the limb, with CGA, requiring just cueing for R attention and Rw management. Pt's RUE with severe ataxia/proprioceptive deficits when visual attention is not sustained. She required cueing to doff LB clothing seated instead of standing. She transferred into the shower with CGA. She had poor awareness of when the RUE dropped the washcloth and would continue to wash with just her hand, scratching herself without intervention from OT. She completed LB bathing with CGA in standing. She transferred back to EOB with the RW- removed orthosis and did not notice a big difference in R RW management/attention. She dressed from EOB with (S)- gown and CGA for donning depends. She required mod A to don socks. She completed 100 ft of functional mobility with the RW with CGA, min cueing for R attention. She completed RUE NMR activity- grasping a clothespin with very poor coordination and awareness of coordination- often reaching without a clothespin and continuing to try and place something on the target-  little to no awareness of her error. She required mod cueing and min A for hand prehension. She returned to her room following. She was left supine with all needs met, bed alarm set.    Session 2 Returned to room for a make up  session- pt agreeable to participate at bed level in RUE [facility] training. No c/o pain. She completed threading task at midline with large (1/2-1 in) beads. She had excellent attention to task  and completed the 20+ repetitions with extra time and effort for hand prehension and object manipulation. She was left supine with all needs met, bed alarm set.    Therapy Documentation Precautions:  Precautions Precautions: Fall (right inattention.) Precaution/Restrictions Comments: R inattention Restrictions Weight Bearing Restrictions Per Provider Order: No Therapy/Group: Individual Therapy  Nena VEAR Moats 08/07/2024, 1:34 PM

## 2024-08-08 MED ORDER — ENOXAPARIN SODIUM 60 MG/0.6ML IJ SOSY
50.0000 mg | PREFILLED_SYRINGE | Freq: Every day | INTRAMUSCULAR | Status: DC
  Administered 2024-08-09 – 2024-08-10 (×2): 50 mg via SUBCUTANEOUS
  Filled 2024-08-08 (×2): qty 0.6

## 2024-08-08 MED ORDER — PANTOPRAZOLE SODIUM 40 MG PO TBEC
40.0000 mg | DELAYED_RELEASE_TABLET | Freq: Every day | ORAL | Status: DC
  Administered 2024-08-08 – 2024-08-10 (×3): 40 mg via ORAL
  Filled 2024-08-08 (×3): qty 1

## 2024-08-08 MED ORDER — ALUM & MAG HYDROXIDE-SIMETH 200-200-20 MG/5ML PO SUSP: 30.0000 mL | ORAL | Status: DC | PRN

## 2024-08-08 MED ORDER — POLYETHYLENE GLYCOL 3350 17 G PO PACK
17.0000 g | PACK | Freq: Every day | ORAL | Status: DC
  Administered 2024-08-08 – 2024-08-10 (×3): 17 g via ORAL
  Filled 2024-08-08 (×3): qty 1

## 2024-08-08 MED ORDER — TRIAMCINOLONE 0.1 % CREAM:EUCERIN CREAM 1:1
TOPICAL_CREAM | Freq: Two times a day (BID) | CUTANEOUS | Status: DC
  Filled 2024-08-08: qty 60

## 2024-08-08 NOTE — Progress Notes (Signed)
 PROGRESS NOTE   Subjective/Complaints:   No acute complaints.  No events overnight.  Patient's daughter at bedside, discussed nighttime urinary incontinence and refusal to go to the bathroom.  Daughter states this has been going on for some time, she was wearing briefs at home.  Has issues with the nurses responding in time for her to get to the bathroom, which should not be a problem at home.  Patient denies any pain, discomfort.  Looking forward to family training today.   ROS: Denies fevers, chills, N/V, abdominal pain, constipation, diarrhea, SOB, cough, chest pain, new weakness or    Objective:   No results found. Recent Labs    08/06/24 0512  WBC 5.1  HGB 7.9*  HCT 24.5*  PLT 187    Recent Labs    08/06/24 0512  NA 141  K 3.5  CL 109  CO2 22  GLUCOSE 104*  BUN 9  CREATININE 1.12*  CALCIUM 10.5*    Intake/Output Summary (Last 24 hours) at 08/08/2024 0939 Last data filed at 08/08/2024 9166 Gross per 24 hour  Intake 720 ml  Output 100 ml  Net 620 ml        Physical Exam: Vital Signs Blood pressure 121/61, pulse 79, temperature 97.8 F (36.6 C), resp. rate 18, height 5' 5 (1.651 m), weight 106.1 kg, SpO2 99%.   General: No acute distress.  Sitting upright in bedside wheelchair. Mood and affect are appropriate Heart: Regular rate and rhythm no rubs murmurs or extra sounds Lungs: Clear to auscultation, breathing unlabored, no rales or wheezes Abdomen: Positive bowel sounds, soft nontender to palpation, nondistended Extremities: No clubbing, cyanosis, or edema Skin: + Small, dark circular rash along bilateral extensor surfaces of arms, back, and face.    MSK:      No apparent deformity.  Neuro: Awake, alert, oriented x 3 Mild dysarthria Mild to moderate cognitive deficits Sensation intact Reflexes intact Spasticity: MAS 0 in all extremities.       Strength:  + Right upper extremity motor  apraxia Strength: 5- out of 5 right upper extremity, otherwise 5 out of 5 throughout    Assessment/Plan: 1. Functional deficits which require 3+ hours per day of interdisciplinary therapy in a comprehensive inpatient rehab setting. Physiatrist is providing close team supervision and 24 hour management of active medical problems listed below. Physiatrist and rehab team continue to assess barriers to discharge/monitor patient progress toward functional and medical goals  Care Tool:  Bathing    Body parts bathed by patient: Right arm, Left arm, Chest, Abdomen, Front perineal area, Right upper leg, Buttocks, Face, Left lower leg, Right lower leg, Left upper leg         Bathing assist Assist Level: Minimal Assistance - Patient > 75%     Upper Body Dressing/Undressing Upper body dressing   What is the patient wearing?: Pull over shirt    Upper body assist Assist Level: Minimal Assistance - Patient > 75%    Lower Body Dressing/Undressing Lower body dressing      What is the patient wearing?: Underwear/pull up, Pants     Lower body assist Assist for lower body dressing: Minimal Assistance - Patient >  75%     Toileting Toileting    Toileting assist Assist for toileting: Minimal Assistance - Patient > 75%     Transfers Chair/bed transfer  Transfers assist     Chair/bed transfer assist level: Contact Guard/Touching assist     Locomotion Ambulation   Ambulation assist      Assist level: Contact Guard/Touching assist Assistive device: Walker-rolling (hand orthosis) Max distance: 150'   Walk 10 feet activity   Assist     Assist level: Contact Guard/Touching assist Assistive device: Walker-rolling, Orthosis   Walk 50 feet activity   Assist Walk 50 feet with 2 turns activity did not occur: Safety/medical concerns  Assist level: Contact Guard/Touching assist Assistive device: Walker-rolling, Orthosis    Walk 150 feet activity   Assist Walk 150 feet  activity did not occur: Safety/medical concerns  Assist level: Contact Guard/Touching assist Assistive device: Walker-rolling, Orthosis    Walk 10 feet on uneven surface  activity   Assist Walk 10 feet on uneven surfaces activity did not occur: Safety/medical concerns   Assist level: Contact Guard/Touching assist Assistive device: Walker-rolling, Orthosis   Wheelchair     Assist Is the patient using a wheelchair?: Yes Type of Wheelchair: Manual    Wheelchair assist level: Dependent - Patient 0% Max wheelchair distance: 0 (unable to mobilize w/ RUE.)    Wheelchair 50 feet with 2 turns activity    Assist        Assist Level: Dependent - Patient 0%   Wheelchair 150 feet activity     Assist      Assist Level: Dependent - Patient 0%   Blood pressure 121/61, pulse 79, temperature 97.8 F (36.6 C), resp. rate 18, height 5' 5 (1.651 m), weight 106.1 kg, SpO2 99%.  Medical Problem List and Plan: 1. 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  Plan 30-day cardiac event monitor             -patient may shower             -ELOS/Goals: 7-10 days, SP[facility] PT/OT/Min A SLP - 10/18 DC date             -Continue inpatient rehab    - 10/14: Plan to go home with daughter; CGA ADLs and trasnfers with RW; ataxic and apraxic RUE. CGA with bathing/dressing. CG for 40 ft with RW; therapy tolerance poor (refusing therapies frequently). Upgraded to dys 2 diet yesterday. Unsure if 24/7 at home but she is not impulsive.   2.  Antithrombotics: -DVT/anticoagulation:  Pharmaceutical: Lovenox              -antiplatelet therapy: Plavix alone 75 mg daily due to aspirin  allergy  3. Pain Management chronic pain: Currently on Tylenol  as needed.  Prior to admission patient was taking hydrocodone  5-325 mg 1 tablet every 4 hours as needed as well as Robaxin  every 6 hours as needed muscle spasms.  Resume as needed   - Per patient, pain well-controlled on current  regimen  4. Mood/Behavior/Sleep/bipolar disorder: Cymbalta  30 mg daily, Seroquel  25 mg QHS, and clonidine  0.1 mg BID at baseline             -antipsychotic agents: Seroquel  50 mg nightly   - 10/14: sleeping well; continue current regimen; can resume clonidine  as outpatient if needed  5. Neuropsych/cognition: This patient is not capable of making decisions on her own behalf. 6. Skin/Wound Care: Routine skin checks   - Buttocks/sacral MASD  10-16: Chronic rash, add hydrocortisone/Eucerin  combination cream twice daily  7. Fluids/Electrolytes/Nutrition: Routine in and outs with follow-up chemistries  - 10-10: Poor p.o. intake this a.m.  Mild AKI.  Albumin low, 3.  Will get dietary eval    - 10-13: No labs today; ordered for tomorrow--improved  8.  Dysphagia.  Dysphagia #1 thin liquids.  Follow-up speech therapy 9.  Hyperlipidemia.  Lipitor 10.  AKI.  Resolved.  Baseline creatinine 1.2.  Follow-up chemistries   - Mild, encourage p.o. fluids.  Repeat BMP in AM.--None ordered, repeat 10-14   - Improving 10/14; trend  11.  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  Dietary follow-up 12.  History of alcohol use.  Provide counseling 13.  Microcytic anemia.  Baseline hemoglobin 8-10.  Hemoglobin dipping to 7.2.  Currently stable.  Anemia panel consistent with iron deficiency anemia.  Iron supplement added.  - Labs ordered for 10-14--stable  14.  Hypertension.  Cozaar 25 mg daily, Bidil  20-37.5 mg 3 times daily.  Monitor with increased mobility   - Well-controlled on current regimen    08/08/2024    4:54 AM 08/07/2024    7:30 PM 08/07/2024    1:07 PM  Vitals with BMI  Systolic 121 136 864  Diastolic 61 66 62  Pulse 79 73 90    15.  Constipation.  No bowel movement since admission.  Is passing flatus.  - Last bowel movement 10-11--patient denies any feelings of constipation.  - 10-15: Schedule Senokot S 1 tab twice daily--add miralax  17 g daily 10/16    16.  Urinary incontinence.  Noted since 2023.  Removed PureWick, monitor PVRs.   - Patient denies sensation to urinate; chronic. Timed toiletting.    - 10/14: PVRs low; continent this PM, monitor--all continent 10-15   - consistently refused at bedtime timed toileting--discussed with family, no concerns regarding this for home  17.  Suspect GERD positional at night  EKG normal add Pecid 10mg  BID - pt notes improvement in symptoms  10-14: Recurrent per nursing.  Vital stable, exam nonfocal.  Self-limited. Increase prilosec to 20 mg BID, add tums TID AC  No complaints 10-15  LOS: 7 days A FACE TO FACE EVALUATION WAS PERFORMED  Joesph JAYSON Likes 08/08/2024, 9:39 AM

## 2024-08-08 NOTE — Progress Notes (Signed)
 Occupational Therapy Session Note  Patient Details  Name: Christie Meza MRN: [id number] Date of Birth: [birth year]  Today's Date: 08/08/2024 OT Individual Time: 1005-1100 OT Individual Time Calculation (min): 55 min    Short Term Goals: Week 1:  OT Short Term Goal 1 (Week 1): STG= LTG d/t ELOS  Skilled Therapeutic Interventions/Progress Updates:  Pt greeted sitting EOB for skilled OT session with focus on caregiver education.   Pain: Pt with no reports of pain. OT offering intermediate rest breaks and positioning suggestions throughout session to potential address pain/fatigue and maximize participation/safety in session.   Functional Transfers/Self Care Tasks: Pt's daughter present for caregiver education with focus on OT role, OT POC, and current patient functioning. Functional transfers (walk-in shower and room-level ambulation), and DME recommendations reviewed. Emphasis placed on patient's need for cuing to manage of R-sided inattention/proprioceptive deficits as well as decreased safety awareness with RW. Daughter assures she will be handling all IADLs to prevent patient fall/injury. Pt's caregiver competes hands-on training for functional transfers, all questions answered at end of session. Handout provided for non-skid shower strips.  Pt remained sitting in WC with 4Ps assessed and immediate needs met. Pt continues to be appropriate for skilled OT intervention to promote further functional independence in ADLs/IADLs.   Therapy Documentation Precautions:  Precautions Precautions: Fall (right inattention.) Precaution/Restrictions Comments: R inattention Restrictions Weight Bearing Restrictions Per Provider Order: No   Therapy/Group: Individual Therapy  Nereida Habermann, OTR/L, MSOT  08/08/2024, 7:54 AM

## 2024-08-08 NOTE — Progress Notes (Signed)
 Speech Language Pathology Daily Session Note  Patient Details  Name: TAJE TONDREAU MRN: [id number] Date of Birth: 03-22-1946  Today's Date: 08/08/2024 SLP Individual Time: 0900-0930 SLP Individual Time Calculation (min): 30 min  Short Term Goals: Week 1: SLP Short Term Goal 1 (Week 1): STGs=LTGs d/t ELOS  Skilled Therapeutic Interventions:   Pt greeted at bedside for tx targeting communication and dysphagia. SLP facilitated PO trials of Dys 3 textures and thin. No overt s/s of airway invasion noted and she was able to completely clear her R buccal cavity in a timely manner. She would benefit from upgrade to Dys 3 textures. Continue w/ full supervision at this time though given problem solving deficits. Pt's daughter arrived after completion of PO trials. SLP facilitated education re diet upgrade as well as ST goals. Provided education re aphasia, functional language tasks to continue, and subsequent safety concerns in the home environment (I.e. ability to call 911/help if needed). Pt's daughter verbalized understanding of all education. At the end of tasks, she was left w/ the bed alarm set, call light within reach, and daughter present. Recommend cont ST per POC.    Pain  No pain reported  Therapy/Group: Individual Therapy  Recardo DELENA Mole 08/08/2024, 9:14 AM

## 2024-08-08 NOTE — Progress Notes (Signed)
 Physical Therapy Session Note  Patient Details  Name: Christie Meza MRN: [id number] Date of Birth: 11-19-1945  Today's Date: 08/08/2024 PT Individual Time: 1105-1205 PT Individual Time Calculation (min): 60 min   Today's Date: 08/08/2024 PT Individual Time: 1304-1400 PT Individual Time Calculation (min): 56 min   Short Term Goals: Week 1:  PT Short Term Goal 1 (Week 1): STG=LTG 2/2 ELOS  Skilled Therapeutic Interventions/Progress Updates:     1st Session: Pt received seated in WC and agrees to therapy. No complaint of pain. Pt's daughter present for family education. PT provides update on pt's mobility progress at [facility] and makes recommendations for safe mobility following DC. WC transport to gym. For time management. Pt completes car transfer without AD, with close supervision and cues for sequencing and safety. Pt requires extended seated rest break. Pt then performs 3x10 standing squats, holding onto back of chair for upper extremity support, with PT providing cues to optimal body mechanics and correct performance. Pt ambulates x20' back to [facility] with RW and similar cues. Left seated with all needs within reach.   2nd Session: Pt received seated in recliner and agrees to therapy. No complaint of pain. Pt performs ambulatory transfer to [facility] with cues for sequencing and safe AD management. WC transport to gym. WC transport to gym. Pt stands and ambulates x175' with close supervision, with cues to attend to Rt hand to improve safety, as well as maintaining upright gaze to improve posture and balance. Seated rest break. Pt then performs standing balance activity with emphasis on RUE coordination, tasked with retrieving x10 horseshoes hung atop a mirror positioned for visual feedback. Pt completes with cues for Rt attention, but no physical assistance required to maintain balance, with pt then placing horseshoes in box placed on pt's Rt to promote trunk rotation to the Rt. Activity progressed by having  pt reach for squigz with RUE and remove from mirror to place in box on right, challenge fine motor skills, coordination, and dynamic standing balance. Pt requires occasional minA to coordinate release of object into box, as well as cues to bring attention to Rt. Following, pt tasked with wiping squigz off with disinfecting wipes to challenge coordination and Rt sided attention. WC transport back to room. Pt performs ambulatory back to bed with CGA. Left supine with all needs within reach.   Therapy Documentation Precautions:  Precautions Precautions: Fall (right inattention.) Precaution/Restrictions Comments: R inattention Restrictions Weight Bearing Restrictions Per Provider Order: No   Therapy/Group: Individual Therapy  Elsie JAYSON Dawn PT, DPT 08/08/2024, 4:07 PM

## 2024-08-08 NOTE — Plan of Care (Signed)
  Problem: Consults Goal: RH STROKE PATIENT EDUCATION Description: See Patient Education module for education specifics  Outcome: Progressing   Problem: RH BOWEL ELIMINATION Goal: RH STG MANAGE BOWEL WITH ASSISTANCE Description: STG Manage Bowel with supervision-minimal Assistance. Outcome: Progressing   Problem: RH BLADDER ELIMINATION Goal: RH STG MANAGE BLADDER WITH ASSISTANCE Description: STG Manage Bladder With supervision- minimal Assistance Outcome: Progressing   Problem: RH SKIN INTEGRITY Goal: RH STG SKIN FREE OF INFECTION/BREAKDOWN Description: Manage skin free of infection/breakdown with supervision-minimal assistance Outcome: Progressing   Problem: RH SAFETY Goal: RH STG ADHERE TO SAFETY PRECAUTIONS W/ASSISTANCE/DEVICE Description: STG Adhere to Safety Precautions With supervision- minimal Assistance/Device. Outcome: Progressing   Problem: RH PAIN MANAGEMENT Goal: RH STG PAIN MANAGED AT OR BELOW PT'S PAIN GOAL Description: < 4 w/ prns Outcome: Progressing   Problem: RH KNOWLEDGE DEFICIT Goal: RH STG INCREASE KNOWLEDGE OF HYPERTENSION Description: Manage increase knowledge of hypertension with supervision- minimal assistance from partner / dtr using educational materials provided Outcome: Progressing Goal: RH STG INCREASE KNOWLEDGE OF DYSPHAGIA/FLUID INTAKE Description: Manage increase knowledge of dysphagia with supervision- minimal assistance from partner / dtr using educational materials provided Outcome: Progressing Goal: RH STG INCREASE KNOWLEGDE OF HYPERLIPIDEMIA Description: Manage increase knowledge of hyperlipidemia with supervision- minimal assistance from partner / dtr using educational materials provided Outcome: Progressing Goal: RH STG INCREASE KNOWLEDGE OF STROKE PROPHYLAXIS Description: Manage increase knowledge of stroke prophylaxis  with supervision- minimal assistance from partner / dtr using educational materials provided Outcome: Progressing

## 2024-08-08 NOTE — Progress Notes (Signed)
 Nutrition Follow Up  DOCUMENTATION CODES:   Obesity unspecified  INTERVENTION:  Magic cup TID with meals, each supplement provides 290 kcal and 9 grams of protein  Ensure Plus High Protein po BID, each supplement provides 350 kcal and 20 grams of protein.  Encouraged adequate intake of meals and supplements to meet calorie and protein needs  Monitor progress made with SLP and any diet advancements  NUTRITION DIAGNOSIS:   Increased nutrient needs related to other (see comment) (rehab participation) as evidenced by estimated needs. Remains applicable  GOAL:   Patient will meet greater than or equal to 90% of their needs Progressing  MONITOR:   PO intake, Supplement acceptance, Diet advancement, Weight trends  REASON FOR ASSESSMENT:   Consult Assessment of nutrition requirement/status  ASSESSMENT:   Pt with hx of HTN, HLD, bipolar disorder, anemia, EtOH use, and chronic pain. Recent admission 10/4-10/9 with acute L MCA CVA. Hospital course complicated by aphasia, dysarthria, and dysphagia. Admitted to CIR to work on functional mobility and weakness.  10/9 admitted to [facility]  Spoke with pt who was sitting up in bed at time of assessment. Pt still working with speech on language progression and swallow progression. Pt reports good appetite, intake seems to have improved since last assessment. Average intake around 38%. Pt eager to have diet advanced further and feels this would help intake. (Currently on dys 2) Pt agreeable to continue Ensure supplements and states she has been eating magic cups. Encouraged pt to continue hard work with speech and will monitor for progress.   Pt has not had bowel movement since 10/11, only on senna, may need additions to bowel regimen to help regularity.    Medications: Ferrous sulfate 325 mg daily Senna  Ferrous sulfate Pepcid TUMS TID  Labs reviewed   Diet Order:   Diet Order             DIET DYS 3 Room service appropriate? Yes  with Assist; Fluid consistency: Thin  Diet effective now                   EDUCATION NEEDS:   Education needs have been addressed  Skin:  Skin Assessment: Reviewed RN Assessment  Last BM:  10/11  Height:   Ht Readings from Last 1 Encounters:  08/01/24 5' 5 (1.651 m)    Weight:   Wt Readings from Last 1 Encounters:  08/06/24 106.1 kg    Ideal Body Weight:  56.8 kg  BMI:  Body mass index is 38.92 kg/m.  Estimated Nutritional Needs:   Kcal:  1500-1700  Protein:  65-85g  Fluid:  1.5-1.7L    Josette Glance, MS, RDN, LDN Clinical Dietitian I Please reach out via secure chat

## 2024-08-09 ENCOUNTER — Other Ambulatory Visit (HOSPITAL_COMMUNITY): Payer: Self-pay

## 2024-08-09 MED ORDER — OMEPRAZOLE 20 MG PO CPDR
20.0000 mg | DELAYED_RELEASE_CAPSULE | Freq: Every day | ORAL | 0 refills | Status: DC
  Filled 2024-08-09: qty 30, 30d supply, fill #0

## 2024-08-09 MED ORDER — TIZANIDINE HCL 4 MG PO TABS
4.0000 mg | ORAL_TABLET | Freq: Every evening | ORAL | 0 refills | Status: DC | PRN
  Filled 2024-08-09: qty 30, 30d supply, fill #0

## 2024-08-09 MED ORDER — PANTOPRAZOLE SODIUM 40 MG PO TBEC
40.0000 mg | DELAYED_RELEASE_TABLET | Freq: Every day | ORAL | 2 refills | Status: AC
  Filled 2024-08-09: qty 30, 30d supply, fill #0

## 2024-08-09 MED ORDER — CLOPIDOGREL BISULFATE 75 MG PO TABS
75.0000 mg | ORAL_TABLET | Freq: Every day | ORAL | 0 refills | Status: AC
  Filled 2024-08-09: qty 30, 30d supply, fill #0

## 2024-08-09 MED ORDER — CYANOCOBALAMIN 1000 MCG PO TABS
1000.0000 ug | ORAL_TABLET | Freq: Every day | ORAL | 0 refills | Status: AC
  Filled 2024-08-09: qty 30, 30d supply, fill #0

## 2024-08-09 MED ORDER — QUETIAPINE FUMARATE 50 MG PO TABS
50.0000 mg | ORAL_TABLET | Freq: Every day | ORAL | 0 refills | Status: AC
  Filled 2024-08-09: qty 30, 30d supply, fill #0

## 2024-08-09 MED ORDER — ATORVASTATIN CALCIUM 40 MG PO TABS
40.0000 mg | ORAL_TABLET | Freq: Every day | ORAL | 0 refills | Status: DC
  Filled 2024-08-09: qty 30, 30d supply, fill #0

## 2024-08-09 MED ORDER — LOSARTAN POTASSIUM 25 MG PO TABS
25.0000 mg | ORAL_TABLET | Freq: Every day | ORAL | 0 refills | Status: AC
  Filled 2024-08-09: qty 30, 30d supply, fill #0

## 2024-08-09 MED ORDER — DULOXETINE HCL 30 MG PO CPEP
30.0000 mg | ORAL_CAPSULE | Freq: Every day | ORAL | 0 refills | Status: AC
  Filled 2024-08-09: qty 30, 30d supply, fill #0

## 2024-08-09 MED ORDER — GABAPENTIN 100 MG PO CAPS
100.0000 mg | ORAL_CAPSULE | Freq: Three times a day (TID) | ORAL | 0 refills | Status: AC
  Filled 2024-08-09: qty 90, 30d supply, fill #0

## 2024-08-09 MED ORDER — FERROUS GLUCONATE 324 (38 FE) MG PO TABS
324.0000 mg | ORAL_TABLET | Freq: Every day | ORAL | 0 refills | Status: AC
  Filled 2024-08-09: qty 30, 30d supply, fill #0

## 2024-08-09 MED ORDER — SORBITOL 70 % SOLN
30.0000 mL | Freq: Once | Status: AC
  Administered 2024-08-09: 30 mL via ORAL
  Filled 2024-08-09: qty 30

## 2024-08-09 MED ORDER — FUROSEMIDE 20 MG PO TABS
20.0000 mg | ORAL_TABLET | Freq: Every day | ORAL | 0 refills | Status: AC | PRN
  Filled 2024-08-09: qty 10, 10d supply, fill #0

## 2024-08-09 MED ORDER — PANTOPRAZOLE SODIUM 40 MG PO TBEC
40.0000 mg | DELAYED_RELEASE_TABLET | Freq: Every day | ORAL | 0 refills | Status: DC
  Filled 2024-08-09: qty 30, 30d supply, fill #0

## 2024-08-09 MED ORDER — POLYETHYLENE GLYCOL 3350 17 G PO PACK: 17.0000 g | PACK | Freq: Every day | ORAL | Status: AC

## 2024-08-09 MED ORDER — TRAZODONE HCL 100 MG PO TABS
100.0000 mg | ORAL_TABLET | Freq: Every day | ORAL | 0 refills | Status: AC
  Filled 2024-08-09: qty 30, 30d supply, fill #0

## 2024-08-09 MED ORDER — ISOSORB DINITRATE-HYDRALAZINE 20-37.5 MG PO TABS
1.0000 | ORAL_TABLET | Freq: Three times a day (TID) | ORAL | 0 refills | Status: AC
  Filled 2024-08-09: qty 90, 30d supply, fill #0

## 2024-08-09 MED ORDER — ALBUTEROL SULFATE HFA 108 (90 BASE) MCG/ACT IN AERS
1.0000 | INHALATION_SPRAY | Freq: Four times a day (QID) | RESPIRATORY_TRACT | 0 refills | Status: AC | PRN
  Filled 2024-08-09: qty 6.7, 50d supply, fill #0

## 2024-08-09 MED ORDER — PRAVASTATIN SODIUM 40 MG PO TABS
40.0000 mg | ORAL_TABLET | Freq: Every day | ORAL | 0 refills | Status: DC
  Filled 2024-08-09: qty 30, 30d supply, fill #0

## 2024-08-09 MED ORDER — ATORVASTATIN CALCIUM 40 MG PO TABS
40.0000 mg | ORAL_TABLET | Freq: Every day | ORAL | 11 refills | Status: AC
  Filled 2024-08-09: qty 30, 30d supply, fill #0

## 2024-08-09 NOTE — Progress Notes (Signed)
 Inpatient Rehabilitation Discharge Medication Review by a Pharmacist  A complete drug regimen review was completed for this patient to identify any potential clinically significant medication issues.  High Risk Drug Classes Is patient taking? Indication by Medication  Antipsychotic Yes Quetiapine  - agitation, mood  Anticoagulant No   Antibiotic No   Opioid No   Antiplatelet Yes Clopidogrel - CVA (Aspirin  allergy)  Hypoglycemics/insulin No   Vasoactive Medication Yes Losartan - BP Bidil  - CAD Lasix- PRN fluid   Chemotherapy No   Other Yes Atorvastatin - HLD Duloxetine  - mood, pain Pantoprazole - reflux  PEG, docusate- constipation  Albuterol- SOB  Cyanocobalamin , ferrous gluconate- supplement  Gabapentin - neuropathy  Trazodone - insomnia      Type of Medication Issue Identified Description of Issue Recommendation(s)  Drug Interaction(s) (clinically significant)     Duplicate Therapy     Allergy     No Medication Administration End Date     Incorrect Dose     Additional Drug Therapy Needed     Significant med changes from prior encounter (inform family/care partners about these prior to discharge). STOP: tizanidine, mobic  Therapy change: pravastatin >>atorvastatin, omeprazole>>pantoprazole     Other       Clinically significant medication issues were identified that warrant physician communication and completion of prescribed/recommended actions by midnight of the next day:  No  Name of provider notified for urgent issues identified:   Provider Method of Notification:     Pharmacist comments: None  Massie Fila, PharmD Clinical Pharmacist  08/09/2024 7:50 AM

## 2024-08-09 NOTE — Progress Notes (Signed)
 Physical Therapy Discharge Summary  Patient Details  Name: HITOMI SLAPE MRN: [id number] Date of Birth: April 13, 1946  Date of Discharge from PT service:August 09, 2024  Today's Date: 08/09/2024 PT Individual Time: 1103-1203 PT Individual Time Calculation (min): 60 min    Patient has met 9 of 10 long term goals due to improved activity tolerance, improved balance, improved postural control, increased strength, improved attention, improved awareness, and improved coordination.  Patient to discharge at an ambulatory level Supervision.   Patient's care partner is independent to provide the necessary physical and cognitive assistance at discharge.  Reasons goals not met: Pt did not complete stair goal due to refusal to attempt, but pt does not have stairs at home so pt is at adequate level for safe discharge.   Recommendation:  Patient will benefit from ongoing skilled PT services in outpatient setting to continue to advance safe functional mobility, address ongoing impairments in balance, ambulation, coordination, Rt sided attention and NMR, and minimize fall risk.  Equipment: RW  Reasons for discharge: treatment goals met and discharge from hospital  Patient/family agrees with progress made and goals achieved: Yes  Skilled Therapeutic Interventions: Pt received supine in bed and agrees to therapy. No complaint of pain. P tperforms supine to sit with bed features and cues for positioning at EOB. Pt performs sit to stand with RW and cues for hand placement. Pt ambulates multiple bouts during session, x150', x200', and x225', with RW and close supervision, with cues for upright gaze to improve posture and balance, and increasing stride length to decrease risk for falls. PT also cues pt to attend to Rt hand for safe placement. Pt refuses to attempt stair training at this time, despite PT encouragement. Pt completes Nustep for endurance training and reciprocal coordination. PT provides cues for  hand and foot placement and completing full available ROM. Pt completes x10:00 at workload of 4 with average steps per minute 30. Pt left supine in bed with all needs within reach.   PT Discharge Precautions/Restrictions Precautions Precautions: Fall Precaution/Restrictions Comments: R inattention Restrictions Weight Bearing Restrictions Per Provider Order: No Pain Interference Pain Interference Pain Effect on Sleep: 1. Rarely or not at all Pain Interference with Therapy Activities: 1. Rarely or not at all Pain Interference with Day-to-Day Activities: 1. Rarely or not at all Vision/Perception  Vision - History Ability to See in Adequate Light: 0 Adequate Perception Perception: Impaired Preception Impairment Details: Inattention/Neglect;Spatial orientation Perception-Other Comments: R inattention Praxis Praxis: Impaired Praxis Impairment Details: Motor planning;Limb apraxia  Cognition Overall Cognitive Status: Impaired/Different from baseline Arousal/Alertness: Awake/alert Attention: Selective Sustained Attention: Appears intact Selective Attention: Impaired Selective Attention Impairment: Verbal basic;Functional basic Memory: Impaired Memory Impairment: Decreased recall of new information Awareness: Impaired Awareness Impairment: Emergent impairment Problem Solving: Impaired Problem Solving Impairment: Verbal basic;Functional basic Executive Function: Sequencing;Organizing;Self Monitoring Sequencing: Impaired Sequencing Impairment: Verbal basic Organizing: Impaired Organizing Impairment: Verbal basic Self Monitoring: Impaired Self Monitoring Impairment: Verbal basic Safety/Judgment: Impaired Comments: Poor awareness of deficits Sensation Sensation Light Touch: Impaired Detail Central sensation comments: Reports some tingling around her R mouth Light Touch Impaired Details: Impaired RUE;Impaired RLE Hot/Cold: Appears Intact Proprioception: Impaired  Detail Proprioception Impaired Details: Impaired RUE;Impaired RLE Stereognosis: Impaired Detail Stereognosis Impaired Details: Impaired RUE Coordination Gross Motor Movements are Fluid and Coordinated: No Fine Motor Movements are Fluid and Coordinated: No Coordination and Movement Description: R hemi- mostly proprioceptive and ataxia vs strength Motor  Motor Motor: Hemiplegia;Motor apraxia Motor - Skilled Clinical Observations: Apraxic and ataxic RUE with  poor awareness  Mobility Bed Mobility Bed Mobility: Supine to Sit;Sit to Supine;Rolling Right;Rolling Left Rolling Right: Supervision/verbal cueing Rolling Left: Supervision/Verbal cueing Supine to Sit: Supervision/Verbal cueing Sit to Supine: Supervision/Verbal cueing Transfers Transfers: Sit to Stand;Stand Pivot Transfers;Stand to Sit Sit to Stand: Supervision/Verbal cueing Stand to Sit: Supervision/Verbal cueing Stand Pivot Transfers: Supervision/Verbal cueing Transfer (Assistive device): Rolling walker Locomotion  Gait Ambulation: Yes Gait Assistance: Supervision/Verbal cueing Gait Distance (Feet): 150 Feet Assistive device: Rolling walker Gait Assistance Details: Verbal cues for precautions/safety;Verbal cues for technique;Verbal cues for safe use of DME/AE Gait Gait: Yes Gait Pattern: Impaired Gait Pattern: Decreased stride length;Trunk flexed Gait velocity: decr Stairs / Additional Locomotion Stairs: No Wheelchair Mobility Wheelchair Mobility: No  Trunk/Postural Assessment  Cervical Assessment Cervical Assessment: Within Functional Limits Thoracic Assessment Thoracic Assessment: Within Functional Limits Lumbar Assessment Lumbar Assessment: Within Functional Limits Postural Control Postural Control: Deficits on evaluation Righting Reactions: delayed.  Balance Balance Balance Assessed: Yes Static Sitting Balance Static Sitting - Balance Support: Feet supported Static Sitting - Level of Assistance: 7:  Independent Dynamic Sitting Balance Dynamic Sitting - Balance Support: Feet supported Dynamic Sitting - Level of Assistance: 6: Modified independent (Device/Increase time) Static Standing Balance Static Standing - Balance Support: Bilateral upper extremity supported Static Standing - Level of Assistance: 5: Stand by assistance Dynamic Standing Balance Dynamic Standing - Balance Support: Bilateral upper extremity supported Dynamic Standing - Level of Assistance: 5: Stand by assistance Extremity Assessment  RUE Assessment RUE Assessment: Exceptions to [facility] General Strength Comments: Strength mostly intact and likely at baseline compared to LUE. Neuro deficits include apraxia, ataxia, and proprioceptive deficits RUE Body System: Neuro Brunstrum levels for arm and hand: Arm;Hand Brunstrum level for arm: Stage V Relative Independence from Synergy Brunstrum level for hand: Stage VI Isolated joint movements LUE Assessment LUE Assessment: Within Functional Limits General Strength Comments: Generalized weakness, 4-5, likely baseline RLE Assessment RLE Assessment: Exceptions to [facility] General Strength Comments: strength grossly 4+/5 LLE Assessment LLE Assessment: Within Functional Limits   Elsie JAYSON Dawn, PT, DPT 08/09/2024, 4:03 PM

## 2024-08-09 NOTE — Progress Notes (Signed)
 Speech Language Pathology Daily Session Note  Patient Details  Name: RUT BETTERTON MRN: [id number] Date of Birth: 1946/08/15  Today's Date: 08/09/2024 SLP Individual Time: 1330-1430 SLP Individual Time Calculation (min): 60 min  Short Term Goals: Week 1: SLP Short Term Goal 1 (Week 1): STGs=LTGs d/t ELOS  Skilled Therapeutic Interventions:   Pt greeted at bedside for tx targeting cognition and communication. She was oriented x4, provided 2 choices for date and year. SLP facilitated mildly complex responsive naming task (animals) and benefited from [facility] for word finding. She also benefited from [facility] for sentence formulation and word finding during compare/contrast task. She demonstrated adequate awareness of errors throughout verbal tasks. She was also able to comprehend Y/N and binary option questions to ID drink preference and complete final education re upcoming d/c. D/c set for 10/18 - see summary for more info.    Pain  No pain reported   Therapy/Group: Individual Therapy  Recardo DELENA Mole 08/09/2024, 1:40 PM

## 2024-08-09 NOTE — Progress Notes (Signed)
 Speech Language Pathology Discharge Summary  Patient Details  Name: Christie Meza MRN: [id number] Date of Birth: [birth year]  Date of Discharge from SLP service:August 09, 2024   Patient has met 5 of 6 long term goals.  Patient to discharge at [facility] level.  Reasons goals not met: emerging success w/ naming tasks   Clinical Impression/Discharge Summary:  Pt made good progress this stay, as evidenced by mastery of 5/6 LTGs. She demonstrates improved expression, comprehension, oral dysphagia, and awareness of cognitive-linguistic deficits. She completes majority of cognitive-linguistic tasks @ minA, but continues to benefit from [facility] for word finding d/t aphasia. She tolerates a Dys 3 diet w/ thin liquids, independently clearing her R buccal cavity as needed. Pt/family education complete. She would benefit from conitnued ST services upon d/c to further target cognition/communication and oral dysphagia, maximize pt independence, and reduce caregiver burden.    Care Partner: Caregiver Able to Provide Assistance: Yes  Type of Caregiver Assistance: Cognitive;Physical  Recommendation:  Home Health SLP  Rationale for SLP Follow Up: Maximize functional communication;Maximize swallowing safety   Equipment: none   Reasons for discharge: Treatment goals met   Patient/Family Agrees with Progress Made and Goals Achieved: Yes    Recardo DELENA Mole 08/09/2024, 4:00 PM

## 2024-08-09 NOTE — Progress Notes (Signed)
 Patient ID: Christie Meza, female   DOB: 10/10/1946, 78 y.o.   MRN: [id number]   5- SW spoke with pt dtr [name] to follow-up about family edu and questions/concern . Reports DME is needed- RW and w/c. SW confirmed with therapy team. She has been informed to expect phone call from [employer] about copay. SW reviewed discharge.  SW ordered above DME with [employer] via parachute.   Graeme Jude, MSW, LCSW Office: [phone] Cell: [phone] Fax: [phone]

## 2024-08-09 NOTE — Progress Notes (Signed)
 Asked patient on multiple occasions if she would like to use the bathroom she stated no. Patient was asked by nurse tech if she would like to get up and use the restroom or if we check her for an incontinence episode. Patient stated I said no I know when I use the bathroom.

## 2024-08-09 NOTE — Progress Notes (Signed)
 PROGRESS NOTE   Subjective/Complaints:   No acute complaints.  No events overnight.    Vitals stable  LBM 10/11  ROS: Denies fevers, chills, N/V, abdominal pain, constipation, diarrhea, SOB, cough, chest pain, new weakness or    Objective:   No results found. No results for input(s): WBC, HGB, HCT, PLT in the last 72 hours.   No results for input(s): NA, K, CL, CO2, GLUCOSE, BUN, CREATININE, CALCIUM in the last 72 hours.   Intake/Output Summary (Last 24 hours) at 08/09/2024 0932 Last data filed at 08/09/2024 0829 Gross per 24 hour  Intake 480 ml  Output --  Net 480 ml        Physical Exam: Vital Signs Blood pressure 126/74, pulse 74, temperature 98.3 F (36.8 C), temperature source Oral, resp. rate 16, height 5' 5 (1.651 m), weight 106.1 kg, SpO2 100%.   General: No acute distress.  Laying in bed Mood and affect are appropriate Heart: Regular rate and rhythm no rubs murmurs or extra sounds Lungs: Clear to auscultation, breathing unlabored, no rales or wheezes Abdomen: Positive bowel sounds, soft nontender to palpation, nondistended Extremities: No clubbing, cyanosis, or edema Skin: + Small, dark circular rash along bilateral extensor surfaces of arms, back, and face.--Improving    MSK:      No apparent deformity.  Neuro: Awake, alert, oriented x 3 Mild dysarthria Mild to moderate cognitive deficits Sensation intact Reflexes intact Spasticity: MAS 0 in all extremities.       Strength:  + Right upper extremity motor apraxia Strength: 5- out of 5 right upper extremity, otherwise 5 out of 5 throughout   Physical exam unchanged from the above on reexamination 08/09/24    Assessment/Plan: 1. Functional deficits which require 3+ hours per day of interdisciplinary therapy in a comprehensive inpatient rehab setting. Physiatrist is providing close team supervision and 24 hour  management of active medical problems listed below. Physiatrist and rehab team continue to assess barriers to discharge/monitor patient progress toward functional and medical goals  Care Tool:  Bathing    Body parts bathed by patient: Right arm, Left arm, Chest, Abdomen, Front perineal area, Right upper leg, Buttocks, Face, Left lower leg, Right lower leg, Left upper leg         Bathing assist Assist Level: Minimal Assistance - Patient > 75%     Upper Body Dressing/Undressing Upper body dressing   What is the patient wearing?: Pull over shirt    Upper body assist Assist Level: Minimal Assistance - Patient > 75%    Lower Body Dressing/Undressing Lower body dressing      What is the patient wearing?: Underwear/pull up, Pants     Lower body assist Assist for lower body dressing: Minimal Assistance - Patient > 75%     Toileting Toileting    Toileting assist Assist for toileting: Minimal Assistance - Patient > 75%     Transfers Chair/bed transfer  Transfers assist     Chair/bed transfer assist level: Contact Guard/Touching assist     Locomotion Ambulation   Ambulation assist      Assist level: Contact Guard/Touching assist Assistive device: Walker-rolling (hand orthosis) Max distance: 150'   Walk  10 feet activity   Assist     Assist level: Contact Guard/Touching assist Assistive device: Walker-rolling, Orthosis   Walk 50 feet activity   Assist Walk 50 feet with 2 turns activity did not occur: Safety/medical concerns  Assist level: Contact Guard/Touching assist Assistive device: Walker-rolling, Orthosis    Walk 150 feet activity   Assist Walk 150 feet activity did not occur: Safety/medical concerns  Assist level: Contact Guard/Touching assist Assistive device: Walker-rolling, Orthosis    Walk 10 feet on uneven surface  activity   Assist Walk 10 feet on uneven surfaces activity did not occur: Safety/medical concerns   Assist level:  Contact Guard/Touching assist Assistive device: Walker-rolling, Orthosis   Wheelchair     Assist Is the patient using a wheelchair?: Yes Type of Wheelchair: Manual    Wheelchair assist level: Dependent - Patient 0% Max wheelchair distance: 0 (unable to mobilize w/ RUE.)    Wheelchair 50 feet with 2 turns activity    Assist        Assist Level: Dependent - Patient 0%   Wheelchair 150 feet activity     Assist      Assist Level: Dependent - Patient 0%   Blood pressure 126/74, pulse 74, temperature 98.3 F (36.8 C), temperature source Oral, resp. rate 16, height 5' 5 (1.651 m), weight 106.1 kg, SpO2 100%.  Medical Problem List and Plan: 1. 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  Plan 30-day cardiac event monitor             -patient may shower             -ELOS/Goals: 7-10 days, SPV PT/OT/Min A SLP - 10/18 DC date             -Continue inpatient rehab    - 10/14: Plan to go home with daughter; CGA ADLs and trasnfers with RW; ataxic and apraxic RUE. CGA with bathing/dressing. CG for 40 ft with RW; therapy tolerance poor (refusing therapies frequently). Upgraded to dys 2 diet yesterday. Unsure if 24/7 at home but she is not impulsive.   The patient is medically ready for discharge 10/18 to home and will need follow-up with [facility]. In addition, they will need to follow up with their PCP, Neurology.   2.  Antithrombotics: -DVT/anticoagulation:  Pharmaceutical: Lovenox              -antiplatelet therapy: Plavix alone 75 mg daily due to aspirin  allergy  3. Pain Management chronic pain: Currently on Tylenol  as needed.  Prior to admission patient was taking hydrocodone  5-325 mg 1 tablet every 4 hours as needed as well as Robaxin  every 6 hours as needed muscle spasms.  Resume as needed   - Per patient, pain well-controlled on current regimen  4. Mood/Behavior/Sleep/bipolar disorder: Cymbalta  30 mg daily, Seroquel  25 mg QHS, and clonidine  0.1  mg BID at baseline             -antipsychotic agents: Seroquel  50 mg nightly   - 10/14: sleeping well; continue current regimen; can resume clonidine  as outpatient if needed  5. Neuropsych/cognition: This patient is not capable of making decisions on her own behalf. 6. Skin/Wound Care: Routine skin checks   - Buttocks/sacral MASD  10-16: Chronic rash, add hydrocortisone/Eucerin combination cream twice daily--improved 10-17  7. Fluids/Electrolytes/Nutrition: Routine in and outs with follow-up chemistries  - 10-10: Poor p.o. intake this a.m.  Mild AKI.  Albumin low, 3.  Will get dietary eval    -  10-13: No labs today; ordered for tomorrow--improved  8.  Dysphagia.  Dysphagia #1 thin liquids.  Follow-up speech therapy 9.  Hyperlipidemia.  Lipitor 10.  AKI.  Resolved.  Baseline creatinine 1.2.  Follow-up chemistries   - Mild, encourage p.o. fluids.  Repeat BMP in AM.--None ordered, repeat 10-14   - Improving 10/14; trend  11.  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  Dietary follow-up 12.  History of alcohol use.  Provide counseling 13.  Microcytic anemia.  Baseline hemoglobin 8-10.  Hemoglobin dipping to 7.2.  Currently stable.  Anemia panel consistent with iron deficiency anemia.  Iron supplement added.  - Labs ordered for 10-14--stable  14.  Hypertension.  Cozaar 25 mg daily, Bidil  20-37.5 mg 3 times daily.  Monitor with increased mobility   - Well-controlled on current regimen    08/09/2024    5:15 AM 08/08/2024    7:36 PM 08/08/2024    3:05 PM  Vitals with BMI  Systolic 126 121 874  Diastolic 74 64 49  Pulse 74 76 75    15.  Constipation.  No bowel movement since admission.  Is passing flatus.  - Last bowel movement 10-11--patient denies any feelings of constipation.  - 10-15: Schedule Senokot S 1 tab twice daily--add miralax  17 g daily 10/16  -10-17: Ordered sorbitol 30 L  16.  Urinary incontinence.  Noted since 2023. Removed PureWick, monitor PVRs.   - Patient denies sensation to  urinate; chronic. Timed toiletting.    - 10/14: PVRs low; continent this PM, monitor--all continent 10-15   - consistently refused at bedtime timed toileting--discussed with family, no concerns regarding this for home  17.  Suspect GERD positional at night  EKG normal add Pecid 10mg  BID - pt notes improvement in symptoms  10-14: Recurrent per nursing.  Vital stable, exam nonfocal.  Self-limited. Increase prilosec to 20 mg BID, add tums TID AC  No complaints 10-15  LOS: 8 days A FACE TO FACE EVALUATION WAS PERFORMED  Christie Meza 08/09/2024, 9:32 AM

## 2024-08-09 NOTE — Progress Notes (Signed)
 Occupational Therapy Session Note  Patient Details  Name: Christie Meza MRN: [id number] Date of Birth: [birth year]  Today's Date: 08/09/2024 OT Individual Time: 9167-9067 OT Individual Time Calculation (min): 60 min    Short Term Goals: Week 1:  OT Short Term Goal 1 (Week 1): STG= LTG d/t ELOS  Skilled Therapeutic Interventions/Progress Updates:    Pt received supine with no c/o pain, agreeable to OT session. She had moved rooms and her w/c was missing so OT got another w/c for her and completed dc testing/questioning of cognition, sensation and vision. She came to EOB with encouragement to participate and (S) overall. Stand pivot to the w/c with (S) using the RW. Pt was taken via w/c to the ADL apt for time management. She worked on a simulated meal prep task to address R attention, functional reaching/NMR with the RUE and dynamic balance. She required mod cueing for visual attention and awareness of the RUE. Education and demonstration provided on using counter to slide heavier items and importance of standing to the R of the stove to avoid apraxic RUE getting on hot items without her realizing. Did reinforce multiple times that OT does NOT recommend that she operate a stove or meal prep any hot items or using a knife. She required mod cueing for RW management when approaching the counter and in small spaces. No physical assist needed during task for balance. Following an extended rest break she completed 100 ft of functional mobility back to her room with the RW at (S) level. She transferred back to bed and was left supine with all needs met, bed alarm set.    Therapy Documentation Precautions:  Precautions Precautions: Fall (right inattention.) Precaution/Restrictions Comments: R inattention Restrictions Weight Bearing Restrictions Per Provider Order: No  Therapy/Group: Individual Therapy  Nena VEAR Moats 08/09/2024, 8:37 AM

## 2024-08-09 NOTE — Progress Notes (Signed)
 Occupational Therapy Discharge Summary  Patient Details  Name: Christie Meza MRN: [id number] Date of Birth: Mar 12, 1946  Date of Discharge from OT service:August 09, 2024  Today's Date: 08/09/2024 OT Individual Time: 1302-1330 OT Individual Time Calculation (min): 28 min    Patient has met 11 of 11 long term goals due to improved activity tolerance, improved balance, postural control, ability to compensate for deficits, functional use of  RIGHT upper and RIGHT lower extremity, improved attention, improved awareness, and improved coordination.  Patient to discharge at overall Supervision level.  Patient's care partner is independent to provide the necessary physical and cognitive assistance at discharge. Christie Meza remains very apraxic and with poor R attention that impacts her safety during all ADLs and transfers. Education has been provided to her family on her deficits and how to assist her at home.   Recommendation:  Patient will benefit from ongoing skilled OT services in home health setting to continue to advance functional skills in the area of BADL and iADL.  Equipment: BSC, RW, wheelchair  Reasons for discharge: treatment goals met and discharge from hospital  Patient/family agrees with progress made and goals achieved: Yes  Skilled OT Intervention Pt received supine with no c/o pain but refusing to get OOB for session. She stated I'm serious, I'm done. OT presented bed level RUE NMR activity and pt was agreeable to this. Set up a peg task where pt used her RUE to reach into the R visual field and pick up a 2 in peg and then follow a near model to improve visual perception, RUE FMC, and functional reach- all for carryover to ADLs. She completed the task with min cueing for sequencing and min A for in hand manipulation. She continues to be very apraxic and has poor error awareness/self correcting despite cueing. Pt left supine with all needs met bed alarm set.   OT  Discharge Precautions/Restrictions  Precautions Precautions: Fall Precaution/Restrictions Comments: R inattention Restrictions Weight Bearing Restrictions Per Provider Order: No ADL ADL Eating: Modified independent Where Assessed-Eating: Edge of bed Grooming: Supervision/safety Where Assessed-Grooming: Edge of bed Upper Body Bathing: Supervision/safety Where Assessed-Upper Body Bathing: Sitting at sink Lower Body Bathing: Supervision/safety Where Assessed-Lower Body Bathing: Sitting at sink Upper Body Dressing: Supervision/safety Where Assessed-Upper Body Dressing: Sitting at sink Lower Body Dressing: Supervision/safety Where Assessed-Lower Body Dressing: Sitting at sink Toileting: Supervision/safety Where Assessed-Toileting: Teacher, adult education: Close supervision Toilet Transfer Method: Stand pivot Tub/Shower Transfer: Close supervison Web designer Method: Ship broker: Emergency planning/management officer, Grab bars Vision Baseline Vision/History: 0 No visual deficits Patient Visual Report: No change from baseline Vision Assessment?: No apparent visual deficits Perception  Perception: Impaired Perception-Other Comments: R inattention Praxis Praxis: Impaired Praxis Impairment Details: Motor planning;Limb apraxia Cognition Cognition Overall Cognitive Status: Impaired/Different from baseline Arousal/Alertness: Awake/alert Orientation Level: Person;Place;Situation Person: Oriented Place: Oriented Situation: Oriented Memory: Impaired Memory Impairment: Decreased recall of new information Attention: Selective Sustained Attention: Appears intact Selective Attention: Impaired Selective Attention Impairment: Verbal basic;Functional basic Awareness: Impaired Awareness Impairment: Emergent impairment Problem Solving: Impaired Problem Solving Impairment: Verbal basic;Functional basic Executive Function: Sequencing;Organizing;Self Monitoring Sequencing:  Impaired Sequencing Impairment: Verbal basic Organizing: Impaired Organizing Impairment: Verbal basic Self Monitoring: Impaired Self Monitoring Impairment: Verbal basic Safety/Judgment: Impaired Comments: Poor awareness of deficits Brief Interview for Mental Status (BIMS) Repetition of Three Words (First Attempt): 3 Temporal Orientation: Year: Missed by more than 5 years Temporal Orientation: Month: Accurate within 5 days Temporal Orientation: Day: Incorrect BIMS Summary Score: 99 Sensation Sensation Light Touch:  Impaired Detail Central sensation comments: Reports some tingling around her R mouth Light Touch Impaired Details: Impaired RUE;Impaired RLE Hot/Cold: Appears Intact Proprioception: Impaired Detail Proprioception Impaired Details: Impaired RUE;Impaired RLE Stereognosis: Impaired Detail Stereognosis Impaired Details: Impaired RUE Coordination Gross Motor Movements are Fluid and Coordinated: No Fine Motor Movements are Fluid and Coordinated: No Coordination and Movement Description: R hemi- mostly proprioceptive and ataxia vs strength Motor  Motor Motor: Hemiplegia;Motor apraxia Motor - Skilled Clinical Observations: Apraxic and ataxic RUE with poor awareness Mobility  Bed Mobility Bed Mobility: Supine to Sit;Sit to Supine;Rolling Right;Rolling Left Rolling Right: Supervision/verbal cueing Rolling Left: Supervision/Verbal cueing Supine to Sit: Supervision/Verbal cueing Sit to Supine: Supervision/Verbal cueing Transfers Sit to Stand: Supervision/Verbal cueing Stand to Sit: Supervision/Verbal cueing  Trunk/Postural Assessment  Cervical Assessment Cervical Assessment: Within Functional Limits Thoracic Assessment Thoracic Assessment: Within Functional Limits Lumbar Assessment Lumbar Assessment: Within Functional Limits Postural Control Postural Control: Deficits on evaluation Righting Reactions: delayed.  Balance Balance Balance Assessed: Yes Static Sitting  Balance Static Sitting - Balance Support: Feet supported Static Sitting - Level of Assistance: 7: Independent Dynamic Sitting Balance Dynamic Sitting - Balance Support: Feet supported Dynamic Sitting - Level of Assistance: 6: Modified independent (Device/Increase time) Static Standing Balance Static Standing - Balance Support: Bilateral upper extremity supported Static Standing - Level of Assistance: 5: Stand by assistance Dynamic Standing Balance Dynamic Standing - Balance Support: Bilateral upper extremity supported Dynamic Standing - Level of Assistance: 5: Stand by assistance Extremity/Trunk Assessment RUE Assessment RUE Assessment: Exceptions to [facility] General Strength Comments: Strength mostly intact and likely at baseline compared to LUE. Neuro deficits include apraxia, ataxia, and proprioceptive deficits RUE Body System: Neuro Brunstrum levels for arm and hand: Arm;Hand Brunstrum level for arm: Stage V Relative Independence from Synergy Brunstrum level for hand: Stage VI Isolated joint movements LUE Assessment LUE Assessment: Within Functional Limits General Strength Comments: Generalized weakness, 4-5, likely baseline   Christie Meza 08/09/2024, 8:43 AM

## 2024-08-09 NOTE — Progress Notes (Signed)
 Inpatient Rehabilitation Care Coordinator Discharge Note   Patient Details  Name: Christie Meza MRN: [id number] Date of Birth: 10-16-1946   Discharge location: D/c to home  Length of Stay: 8 days  Discharge activity level: Supervision  Home/community participation: Limited  Patient response un:Yzjouy Literacy - How often do you need to have someone help you when you read instructions, pamphlets, or other written material from your doctor or pharmacy?: Rarely  Patient response un:Dnrpjo Isolation - How often do you feel lonely or isolated from those around you?: Never  Services provided included: MD, RD, PT, OT, SLP, RN, CM, TR, Pharmacy, Neuropsych, SW  Financial Services:  Field seismologist Utilized: Private Insurance [employer] Medicare and [place]  Choices offered to/list presented to: patient daughter  Follow-up services arranged:  Home Health, DME Home Health Agency: [facility] for HHPT/OT/SLP/aide    DME : [facility] for w/c and RW    Patient response to transportation need: Is the patient able to respond to transportation needs?: Yes In the past 12 months, has lack of transportation kept you from medical appointments or from getting medications?: Yes In the past 12 months, has lack of transportation kept you from meetings, work, or from getting things needed for daily living?: No   Patient/Family verbalized understanding of follow-up arrangements:  Yes  Individual responsible for coordination of the follow-up plan: contact pt dtr [name]  Confirmed correct DME delivered: Graeme DELENA Jude 08/09/2024    Comments (or additional information):fam edu completed  Summary of Stay    Date/Time Discharge Planning CSW  08/06/24 1023 Pt will disharge to home with her dtr. Fam edu on Thursday (10/16) 9am-12pm with dtr. SW will confirm there are no barriers to discharge. AAC       Adiah Guereca A Jude

## 2024-08-10 ENCOUNTER — Other Ambulatory Visit (HOSPITAL_COMMUNITY): Payer: Self-pay

## 2024-08-10 DIAGNOSIS — K5901 Slow transit constipation: Secondary | ICD-10-CM

## 2024-08-10 DIAGNOSIS — I69391 Dysphagia following cerebral infarction: Principal | ICD-10-CM

## 2024-08-10 NOTE — Progress Notes (Signed)
 PROGRESS NOTE   Subjective/Complaints:   No complaints. Anxious to get home today!  ROS: Patient denies fever, rash, sore throat, blurred vision, dizziness, nausea, vomiting, diarrhea, cough, shortness of breath or chest pain, joint or back/neck pain, headache, or mood change.   Objective:   No results found. No results for input(s): WBC, HGB, HCT, PLT in the last 72 hours.   No results for input(s): NA, K, CL, CO2, GLUCOSE, BUN, CREATININE, CALCIUM in the last 72 hours.   Intake/Output Summary (Last 24 hours) at 08/10/2024 0727 Last data filed at 08/09/2024 1824 Gross per 24 hour  Intake 480 ml  Output --  Net 480 ml        Physical Exam: Vital Signs Blood pressure 133/69, pulse 87, temperature 98.6 F (37 C), resp. rate 19, height 5' 5 (1.651 m), weight 106.1 kg, SpO2 100%.   Constitutional: No distress . Vital signs reviewed. obese HEENT: NCAT, EOMI, oral membranes moist Neck: supple Cardiovascular: RRR without murmur. No JVD    Respiratory/Chest: CTA Bilaterally without wheezes or rales. Normal effort    GI/Abdomen: BS +, non-tender, non-distended Ext: no clubbing, cyanosis, or edema Psych: pleasant and cooperative  Skin: + Small, dark circular rash along bilateral extensor surfaces of arms, back, and face.--Improving    MSK:      No apparent deformity.  Neuro: Awake, alert, oriented x 3 Mild dysarthria Mild to moderate cognitive deficits Sensation intact Reflexes intact Spasticity: MAS 0 in all extremities.       Strength:  + Right upper extremity motor apraxia Strength: 5- out of 5 right upper extremity, otherwise 5 out of 5 throughout   Physical exam unchanged from the above on reexamination 08/10/24    Assessment/Plan: 1. Functional deficits which require 3+ hours per day of interdisciplinary therapy in a comprehensive inpatient rehab setting. Physiatrist is  providing close team supervision and 24 hour management of active medical problems listed below. Physiatrist and rehab team continue to assess barriers to discharge/monitor patient progress toward functional and medical goals  Care Tool:  Bathing    Body parts bathed by patient: Right arm, Left arm, Chest, Abdomen, Front perineal area, Right upper leg, Buttocks, Face, Left lower leg, Right lower leg, Left upper leg         Bathing assist Assist Level: Supervision/Verbal cueing     Upper Body Dressing/Undressing Upper body dressing   What is the patient wearing?: Pull over shirt    Upper body assist Assist Level: Supervision/Verbal cueing    Lower Body Dressing/Undressing Lower body dressing      What is the patient wearing?: Underwear/pull up, Pants     Lower body assist Assist for lower body dressing: Supervision/Verbal cueing     Toileting Toileting    Toileting assist Assist for toileting: Supervision/Verbal cueing     Transfers Chair/bed transfer  Transfers assist     Chair/bed transfer assist level: Supervision/Verbal cueing     Locomotion Ambulation   Ambulation assist      Assist level: Supervision/Verbal cueing Assistive device: Walker-rolling Max distance: 225'   Walk 10 feet activity   Assist     Assist level: Supervision/Verbal cueing Assistive device:  Walker-rolling   Walk 50 feet activity   Assist Walk 50 feet with 2 turns activity did not occur: Safety/medical concerns  Assist level: Supervision/Verbal cueing Assistive device: Walker-rolling    Walk 150 feet activity   Assist Walk 150 feet activity did not occur: Safety/medical concerns  Assist level: Supervision/Verbal cueing Assistive device: Walker-rolling    Walk 10 feet on uneven surface  activity   Assist Walk 10 feet on uneven surfaces activity did not occur: Safety/medical concerns   Assist level: Supervision/Verbal cueing Assistive device:  Walker-rolling   Wheelchair     Assist Is the patient using a wheelchair?: No Type of Wheelchair: Manual    Wheelchair assist level: Dependent - Patient 0% Max wheelchair distance: 0 (unable to mobilize w/ RUE.)    Wheelchair 50 feet with 2 turns activity    Assist        Assist Level: Dependent - Patient 0%   Wheelchair 150 feet activity     Assist      Assist Level: Dependent - Patient 0%   Blood pressure 133/69, pulse 87, temperature 98.6 F (37 C), resp. rate 19, height 5' 5 (1.651 m), weight 106.1 kg, SpO2 100%.  Medical Problem List and Plan: 1. 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  Plan 30-day cardiac event monitor             -patient may shower             -ELOS/Goals: dc home today  will need follow-up with [facility] PM[facility]R. In addition, they will need to follow up with their PCP, Neurology.   2.  Antithrombotics: -DVT/anticoagulation:  Pharmaceutical: Lovenox              -antiplatelet therapy: Plavix alone 75 mg daily due to aspirin  allergy  3. Pain Management chronic pain: Currently on Tylenol  as needed.  Prior to admission patient was taking hydrocodone  5-325 mg 1 tablet every 4 hours as needed as well as Robaxin  every 6 hours as needed muscle spasms.  Resume as needed   - Per patient, pain well-controlled on current regimen  4. Mood/Behavior/Sleep/bipolar disorder: Cymbalta  30 mg daily, Seroquel  25 mg QHS, and clonidine  0.1 mg BID at baseline             -antipsychotic agents: Seroquel  50 mg nightly   - 10/18: sleeping well; continue current regimen; can resume clonidine  as outpatient if needed  5. Neuropsych/cognition: This patient is not capable of making decisions on her own behalf. 6. Skin/Wound Care: Routine skin checks   - Buttocks/sacral MASD  10-16: Chronic rash, add hydrocortisone/Eucerin combination cream twice daily--improved 10-17  7. Fluids/Electrolytes/Nutrition: Routine in and outs with follow-up  chemistries  - 10-10: Poor p.o. intake this a.m.  Mild AKI.  Albumin low, 3.  Will get dietary eval    - 10/18 improved PO intake  8.  Dysphagia.  Dysphagia #1 thin liquids.  Follow-up speech therapy 9.  Hyperlipidemia.  Lipitor 10.  AKI.  Resolved.  Baseline creatinine 1.2.  Follow-up chemistries   - Mild, encourage p.o. fluids.  Repeat BMP in AM.--None ordered, repeat 10-14   - Improving 10/14; trend  11.  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  Dietary follow-up 12.  History of alcohol use.  Provide counseling 13.  Microcytic anemia.  Baseline hemoglobin 8-10.  Hemoglobin dipping to 7.2.  Currently stable.  Anemia panel consistent with iron deficiency anemia.  Iron supplement added.  - Labs ordered for 10-14--stable  14.  Hypertension.  Cozaar 25 mg daily, Bidil  20-37.5 mg 3 times daily.  Monitor with increased mobility   - Well-controlled on current regimen    08/10/2024    6:40 AM 08/09/2024    9:41 PM 08/09/2024    5:15 AM  Vitals with BMI  Systolic 133 139 873  Diastolic 69 75 74  Pulse 87 78 74    15.  Constipation.  No bowel movement since admission.  Is passing flatus.  - Last bowel movement 10-11--patient denies any feelings of constipation.  - 10-15: Schedule Senokot S 1 tab twice daily--add miralax  17 g daily 10/16  -10/18--pt with 2 bm's last night  16.  Urinary incontinence.  Noted since 2023. Removed PureWick, monitor PVRs.   - Patient denies sensation to urinate; chronic. Timed toiletting.    - 10/14: PVRs low; continent this PM, monitor--all continent 10-15   - consistently refused at bedtime timed toileting--discussed with family, no concerns regarding this for home  17.  Suspect GERD positional at night  EKG normal add Pecid 10mg  BID - pt notes improvement in symptoms  10-14: Recurrent per nursing.  Vital stable, exam nonfocal.  Self-limited. Increase prilosec to 20 mg BID, add tums TID AC  No complaints 10/18  LOS: 9 days A FACE TO FACE EVALUATION WAS  PERFORMED  Christie Meza 08/10/2024, 7:27 AM

## 2024-08-12 ENCOUNTER — Other Ambulatory Visit (HOSPITAL_COMMUNITY): Payer: Self-pay

## 2024-08-13 ENCOUNTER — Other Ambulatory Visit (HOSPITAL_COMMUNITY): Payer: Self-pay

## 2024-08-14 ENCOUNTER — Other Ambulatory Visit (HOSPITAL_COMMUNITY): Payer: Self-pay

## 2024-08-22 ENCOUNTER — Encounter: Attending: Registered Nurse | Admitting: Registered Nurse

## 2024-09-17 ENCOUNTER — Encounter: Payer: Self-pay | Admitting: Neurology

## 2024-09-17 ENCOUNTER — Inpatient Hospital Stay: Admitting: Neurology

## 2024-09-17 ENCOUNTER — Emergency Department (HOSPITAL_COMMUNITY)
Admission: EM | Admit: 2024-09-17 | Discharge: 2024-09-17 | Disposition: A | Discharge status: Home / Self Care | Attending: Emergency Medicine | Admitting: Emergency Medicine

## 2024-09-17 ENCOUNTER — Emergency Department (HOSPITAL_COMMUNITY)

## 2024-09-17 DIAGNOSIS — W1830XA Fall on same level, unspecified, initial encounter: Secondary | ICD-10-CM | POA: Diagnosis not present

## 2024-09-17 DIAGNOSIS — I10 Essential (primary) hypertension: Secondary | ICD-10-CM | POA: Insufficient documentation

## 2024-09-17 DIAGNOSIS — Z7902 Long term (current) use of antithrombotics/antiplatelets: Secondary | ICD-10-CM | POA: Insufficient documentation

## 2024-09-17 DIAGNOSIS — M542 Cervicalgia: Secondary | ICD-10-CM | POA: Insufficient documentation

## 2024-09-17 DIAGNOSIS — Z8673 Personal history of transient ischemic attack (TIA), and cerebral infarction without residual deficits: Secondary | ICD-10-CM | POA: Insufficient documentation

## 2024-09-17 DIAGNOSIS — R4020 Unspecified coma: Secondary | ICD-10-CM

## 2024-09-17 DIAGNOSIS — Z79899 Other long term (current) drug therapy: Secondary | ICD-10-CM | POA: Diagnosis not present

## 2024-09-17 DIAGNOSIS — R519 Headache, unspecified: Secondary | ICD-10-CM | POA: Diagnosis present

## 2024-09-17 DIAGNOSIS — R55 Syncope and collapse: Secondary | ICD-10-CM | POA: Insufficient documentation

## 2024-09-17 LAB — CBC WITH DIFFERENTIAL/PLATELET
Basophils Absolute: 0 K/uL (ref 0.0–0.1)
Basophils Relative: 0 %
Eosinophils Absolute: 0.1 K/uL (ref 0.0–0.5)
Eosinophils Relative: 2 %
HCT: 27.7 % — ABNORMAL LOW (ref 36.0–46.0)
Hemoglobin: 8.8 g/dL — ABNORMAL LOW (ref 12.0–15.0)
Lymphocytes Relative: 30 %
Lymphs Abs: 1.6 K/uL (ref 0.7–4.0)
MCH: 25 pg — ABNORMAL LOW (ref 26.0–34.0)
MCHC: 31.8 g/dL (ref 30.0–36.0)
MCV: 78.7 fL — ABNORMAL LOW (ref 80.0–100.0)
Monocytes Absolute: 0.1 K/uL (ref 0.1–1.0)
Monocytes Relative: 1 %
Neutro Abs: 3.5 K/uL (ref 1.7–7.7)
Neutrophils Relative %: 67 %
Platelets: 133 K/uL — ABNORMAL LOW (ref 150–400)
RBC: 3.52 MIL/uL — ABNORMAL LOW (ref 3.87–5.11)
RDW: 21.1 % — ABNORMAL HIGH (ref 11.5–15.5)
WBC: 5.2 K/uL (ref 4.0–10.5)
nRBC: 0 % (ref 0.0–0.2)

## 2024-09-17 LAB — URINALYSIS, W/ REFLEX TO CULTURE (INFECTION SUSPECTED)
Bacteria, UA: NONE SEEN
Bilirubin Urine: NEGATIVE
Glucose, UA: NEGATIVE mg/dL
Hgb urine dipstick: NEGATIVE
Ketones, ur: NEGATIVE mg/dL
Leukocytes,Ua: NEGATIVE
Nitrite: NEGATIVE
Protein, ur: NEGATIVE mg/dL
Specific Gravity, Urine: 1.006 (ref 1.005–1.030)
pH: 6 (ref 5.0–8.0)

## 2024-09-17 LAB — TROPONIN I (HIGH SENSITIVITY)
Troponin I (High Sensitivity): 5 ng/L (ref <18)
Troponin I (High Sensitivity): 5 ng/L (ref <18)

## 2024-09-17 LAB — COMPREHENSIVE METABOLIC PANEL WITH GFR
ALT: 8 U/L (ref 0–44)
AST: 24 U/L (ref 15–41)
Albumin: 3.3 g/dL — ABNORMAL LOW (ref 3.5–5.0)
Alkaline Phosphatase: 128 U/L — ABNORMAL HIGH (ref 38–126)
Anion gap: 10 (ref 5–15)
BUN: 13 mg/dL (ref 8–23)
CO2: 21 mmol/L — ABNORMAL LOW (ref 22–32)
Calcium: 10.4 mg/dL — ABNORMAL HIGH (ref 8.9–10.3)
Chloride: 112 mmol/L — ABNORMAL HIGH (ref 98–111)
Creatinine, Ser: 1.29 mg/dL — ABNORMAL HIGH (ref 0.44–1.00)
GFR, Estimated: 43 mL/min — ABNORMAL LOW (ref >60)
Glucose, Bld: 115 mg/dL — ABNORMAL HIGH (ref 70–99)
Potassium: 3.8 mmol/L (ref 3.5–5.1)
Sodium: 143 mmol/L (ref 135–145)
Total Bilirubin: 0.8 mg/dL (ref 0.0–1.2)
Total Protein: 6.1 g/dL — ABNORMAL LOW (ref 6.5–8.1)

## 2024-09-17 LAB — I-STAT CHEM 8, ED
BUN: 15 mg/dL (ref 8–23)
Calcium, Ion: 1.32 mmol/L (ref 1.15–1.40)
Chloride: 111 mmol/L (ref 98–111)
Creatinine, Ser: 1.4 mg/dL — ABNORMAL HIGH (ref 0.44–1.00)
Glucose, Bld: 120 mg/dL — ABNORMAL HIGH (ref 70–99)
HCT: 27 % — ABNORMAL LOW (ref 36.0–46.0)
Hemoglobin: 9.2 g/dL — ABNORMAL LOW (ref 12.0–15.0)
Potassium: 3.8 mmol/L (ref 3.5–5.1)
Sodium: 143 mmol/L (ref 135–145)
TCO2: 23 mmol/L (ref 22–32)

## 2024-09-17 LAB — LIPASE, BLOOD: Lipase: 123 U/L — ABNORMAL HIGH (ref 11–51)

## 2024-09-17 LAB — I-STAT CG4 LACTIC ACID, ED: Lactic Acid, Venous: 2 mmol/L (ref 0.5–1.9)

## 2024-09-17 LAB — CK: Total CK: 80 U/L (ref 38–234)

## 2024-09-17 MED ORDER — SODIUM CHLORIDE 0.9 % IV BOLUS
1000.0000 mL | Freq: Once | INTRAVENOUS | Status: AC
  Administered 2024-09-17: 1000 mL via INTRAVENOUS

## 2024-09-17 NOTE — ED Triage Notes (Signed)
 Unwitnessed Fall and down for about 1 hour. C/o headache and right sided shoulder and side pain states she thinks she passed out. Hx of stroke and asphasia.

## 2024-09-17 NOTE — Discharge Instructions (Signed)
  Contact a health care provider if: You have episodes of near fainting. Get help right away if: You faint. You hit your head or are injured after fainting. You have any of these symptoms that may indicate trouble with your heart: Fast or irregular heartbeats (palpitations). Unusual pain in your chest, abdomen, or back. Shortness of breath. You have a seizure. You have a severe headache. You are confused. You have vision problems. You have severe weakness or trouble walking. You are bleeding from your mouth or rectum, or you have black or tarry stool. These symptoms may represent a serious problem that is an emergency. Do not wait to see if your symptoms will go away. Get medical help right away. Call your local emergency services (911 in the [place] Do not drive yourself to the hospital. 

## 2024-09-17 NOTE — ED Provider Notes (Signed)
 [facility] AT [facility] Provider Note   CSN: [id number] Arrival date & time: 09/17/24  9367     Patient presents with: Fall (Unwitnessed Fall and down for about 1 hour. C/o headache)   Christie Meza is a 78 y.o. female who presents emergency department with a chief complaint of loss of consciousness.  She arrives as a level 2 trauma secondary to use Plavix  and aspirin  secondary to recent CVA.  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  Patient was reported to be in her recliner and when she stood up passed out.  She was reported to have hit her face.  She is complaining of pain in her neck and the entire right side of her body.  She has no other complaints at this time.  History is limited because speech is difficult to understand.    Fall       Prior to Admission medications   Medication Sig Start Date End Date Taking? Authorizing Provider  acetaminophen  (TYLENOL ) 325 MG tablet Take 2 tablets (650 mg total) by mouth every 4 (four) hours as needed for mild pain (or temp > 37.5 C (99.5 F)). 03/04/22   Danton Reyes DASEN, MD  albuterol  (VENTOLIN  HFA) 108 (90 Base) MCG/ACT inhaler Inhale 1 puff into the lungs every 6 (six) hours as needed for shortness of breath. 08/09/24   Angiulli, Toribio PARAS, PA-C  atorvastatin  (LIPITOR) 40 MG tablet Take 1 tablet (40 mg total) by mouth daily. 08/09/24 08/09/25  Angiulli, Toribio PARAS, PA-C  clopidogrel  (PLAVIX ) 75 MG tablet Take 1 tablet (75 mg total) by mouth daily. 08/09/24   Angiulli, Toribio PARAS, PA-C  cyanocobalamin  1000 MCG tablet Take 1 tablet (1,000 mcg total) by mouth daily. 08/09/24   Angiulli, Toribio PARAS, PA-C  docusate sodium  (COLACE) 100 MG capsule Take 1 capsule (100 mg total) by mouth 2 (two) times daily. 03/04/22   Danton Reyes DASEN, MD  DULoxetine  (CYMBALTA ) 30 MG capsule Take 1 capsule (30 mg total) by  mouth daily. 08/09/24   Angiulli, Toribio PARAS, PA-C  ferrous gluconate  (FERGON) 324 MG tablet Take 1 tablet (324 mg total) by mouth daily with breakfast. 08/09/24   Angiulli, Toribio PARAS, PA-C  furosemide  (LASIX ) 20 MG tablet Take 1 tablet (20 mg total) by mouth daily as needed for fluid. 08/09/24   Angiulli, Toribio PARAS, PA-C  gabapentin  (NEURONTIN ) 100 MG capsule Take 1 capsule (100 mg total) by mouth 3 (three) times daily. 08/09/24   Angiulli, Toribio PARAS, PA-C  isosorbide -hydrALAZINE  (BIDIL ) 20-37.5 MG tablet Take 1 tablet by mouth 3 (three) times daily. 08/09/24   Angiulli, Toribio PARAS, PA-C  losartan  (COZAAR ) 25 MG tablet Take 1 tablet (25 mg total) by mouth daily. 08/09/24   Angiulli, Toribio PARAS, PA-C  pantoprazole  (PROTONIX ) 40 MG tablet Take 1 tablet (40 mg total) by mouth daily. 08/10/24   Angiulli, Toribio PARAS, PA-C  polyethylene glycol (MIRALAX  / GLYCOLAX ) 17 g packet Take 17 g by mouth daily. 08/09/24   Angiulli, Toribio PARAS, PA-C  QUEtiapine  (SEROQUEL ) 50 MG tablet Take 1 tablet (50 mg total) by mouth at bedtime. 08/09/24   Angiulli, Toribio PARAS, PA-C  traZODone  (DESYREL ) 100 MG tablet Take 1 tablet (100 mg total) by mouth at bedtime. 08/09/24   Angiulli, Toribio PARAS, PA-C    Allergies: Aspirin     Review of Systems  Updated Vital Signs BP ROLLEN)  158/54 (BP Location: Left Arm)   Pulse 79   Temp (!) 97.4 F (36.3 C) (Oral)   Resp 16   SpO2 98%   Physical Exam Vitals and nursing note reviewed.  Constitutional:      General: She is not in acute distress.    Appearance: She is well-developed. She is not diaphoretic.  HENT:     Head: Normocephalic and atraumatic.     Comments: No obvious facial or head trauma     Right Ear: External ear normal.     Left Ear: External ear normal.     Nose: Nose normal.     Mouth/Throat:     Mouth: Mucous membranes are moist.  Eyes:     General: No scleral icterus.    Conjunctiva/sclera: Conjunctivae normal.  Cardiovascular:     Rate and Rhythm: Normal rate and  regular rhythm.     Heart sounds: Murmur heard.     No friction rub. No gallop.  Pulmonary:     Effort: Pulmonary effort is normal. No respiratory distress.     Breath sounds: Normal breath sounds.  Abdominal:     General: Bowel sounds are normal. There is no distension.     Palpations: Abdomen is soft. There is no mass.     Tenderness: There is no abdominal tenderness. There is no guarding.  Musculoskeletal:     Cervical back: Tenderness present.     Comments: No obvious deformities, Able to move all extremities   Skin:    General: Skin is warm and dry.  Neurological:     Mental Status: She is alert and oriented to person, place, and time.     Comments: Speech is dysarthric R sided weakness  Psychiatric:        Behavior: Behavior normal.     (all labs ordered are listed, but only abnormal results are displayed) Labs Reviewed  I-STAT CHEM 8, ED - Abnormal; Notable for the following components:      Result Value   Creatinine, Ser 1.40 (*)    Glucose, Bld 120 (*)    Hemoglobin 9.2 (*)    HCT 27.0 (*)    All other components within normal limits  COMPREHENSIVE METABOLIC PANEL WITH GFR  CBC WITH DIFFERENTIAL/PLATELET  URINALYSIS, W/ REFLEX TO CULTURE (INFECTION SUSPECTED)  LIPASE, BLOOD  TROPONIN I (HIGH SENSITIVITY)    EKG: None  Radiology: No results found.   Procedures   Medications Ordered in the ED - No data to display  Clinical Course as of 09/17/24 0659  Tue Sep 17, 2024  0654 Hemoglobin(!): 9.2 [KF]    Clinical Course User Index [KF] Jurline Larraine LELON Jann                                 Medical Decision Making Amount and/or Complexity of Data Reviewed Labs: ordered. Radiology: ordered.   This patient presents to the ED for concern of syncope and trauma, this involves an extensive number of treatment options, and is a complaint that carries with it a high risk of complications and morbidity.  The differential for syncope is extensive and  includes, but is not limited to: arrythmia (Vtach, SVT, SSS, sinus arrest, AV block, bradycardia) aortic stenosis, AMI, HOCM, PE, atrial myxoma, pulmonary hypertension, orthostatic hypotension, (hypovolemia, drug effect, GB syndrome, micturition, cough, swall) carotid sinus sensitivity, Seizure, TIA/CVA, hypoglycemia,  Vertigo.   Co morbidities:   has a past  medical history of Bipolar 1 disorder (HCC), Hypertension, and Obesity.and cva   Social Determinants of Health:   SDOH Screenings   Food Insecurity: No Food Insecurity (08/01/2024)  Housing: Unknown (08/01/2024)  Transportation Needs: No Transportation Needs (08/01/2024)  Utilities: Not At Risk (08/01/2024)  Social Connections: Unknown (07/28/2024)  Tobacco Use: Low Risk  (08/01/2024)     Additional history:  {Additional history obtained from emr   Lab Tests:  I Ordered, and personally interpreted labs.  The pertinent results include:    Hgb 8/ baseline Baseline renal insufficiency unchanged   Imaging Studies:  I ordered imaging studies including port chest/pelvis xr; CT head/Cspine I independently visualized and interpreted imaging which showed no acute findings I agree with the radiologist interpretation  Cardiac Monitoring/ECG:  The patient was maintained on a cardiac monitor.  I personally viewed and interpreted the cardiac monitored which showed an underlying rhythm of:   Sinus rhythm rate of 77    Medicines ordered and prescription drug management:  I ordered medication including  Medications  sodium chloride  0.9 % bolus 1,000 mL (0 mLs Intravenous Stopped 09/17/24 1056)   for mild dehydration Reevaluation of the patient after these medicines showed that the patient improved I have reviewed the patients home medicines and have made adjustments as needed  Test Considered:    Critical Interventions:    Consultations Obtained:   Problem List / ED Course:     ICD-10-CM   1. LOC (loss of  consciousness) (HCC)  R40.20       MDM: patient here w syncope , likely orthoststic after getting up from lying down. Labs EKG reassuring. Patient awake and alert. No traumatic findings. Safe for discharge   Dispostion:  After consideration of the diagnostic results and the patients response to treatment, I feel that the patent would benefit from discharge      Final diagnoses:  None    ED Discharge Orders     None          Arloa Chroman, PA-C 09/17/24 1946    Jerrol Agent, MD 09/20/24 2254

## 2024-09-17 NOTE — ED Notes (Signed)
 Pt incontinent of urine. Purewick now in place. Will obtain UA asap.

## 2024-09-17 NOTE — ED Triage Notes (Signed)
 Small abrasions on left side of the faces

## 2024-09-17 NOTE — ED Provider Triage Note (Signed)
 Emergency Medicine Provider Triage Evaluation Note  Christie Meza , a 78 y.o. female  was evaluated in triage.  Pt complains of fall.  Patient presents by EMS after fall on Plavix , possible syncopal event with strike to head.  Patient is confused, unclear if this is baseline..  Review of Systems  Positive: Facial trauma.  Right-sided pain. Negative: Fever  Physical Exam  BP (!) 158/54 (BP Location: Left Arm)   Pulse 79   Temp (!) 97.4 F (36.3 C) (Oral)   Resp 16   SpO2 98%  Gen:   Awake, no distress   Resp:  Normal effort  MSK:   No tenderness over the hips, right upper extremity.  There is edema to bilateral lower extremities  Medical Decision Making  Medically screening exam initiated at 6:43 AM.  Appropriate orders placed.  Christie Meza was informed that the remainder of the evaluation will be completed by another provider, this initial triage assessment does not replace that evaluation, and the importance of remaining in the ED until their evaluation is complete.     Christie Norris, MD 09/17/24 [phone]

## 2024-09-17 NOTE — ED Notes (Signed)
 Report received: FOT, Level 2  trauma, found by roommate ~ 1hr after falling, unwitnessed, questionable syncope, no memory of fall, c/o R side pain rib and hip, L facial abrasions, poor dirty living contditions. Care assumed. Back from imaging/ CT. Sleeping resting, NAD, calm, arousable to voice, some confusion, unable to hold conversation, falls back to sleep, follows commands.

## 2024-09-23 NOTE — Progress Notes (Deleted)
{  This patient may be at risk for Amyloid. She has one or more dx on the prob list or PMH from the following list -  Abnormal EKG, HFpEF/Diastolic CHF, Aortic Stenosis, LVH, Bilateral Carpal Tunnel Syndrome, Biceps Tendon Rupture, Spinal Stenosis, Pericardial Effusion, Left Atrial Enlargement, Conduction System Disorder. See list below or review PMH.  Diagnoses From Problem List           Noted     Abnormal ECG 10/18/2013    Click HERE to open Cardiac Amyloid Screening SmartSet to order screening OR Click HERE to defer testing for 1 year or permanently :1}     OFFICE NOTE:    Date:  09/23/2024  ID:  Erminio JULIANNA Shoulder, DOB 04-Nov-1945, MRN [id number] PCP: Shelda Atlas, MD  [facility] Providers Cardiologist:  None { Click to update primary MD,subspecialty MD or APP then REFRESH:1}       S/p CVA 07/2024 TTE 07/29/24: EF > 75, hyperdynamic LVF w dynamic LVOT obstruction (Valsalva 3.9 m/s, peak 61 mmHg), no RWMA, mild LVH, Gr 1 DD, NL RVSF, mildly elevated PASP, RVSP 36.4, trivial MR, no cardiac source of embolus Neck CTA 10//4/25: No ICA stenosis bilat Obesity Hypertension Hyperlipidemia  ETOH abuse Bipolar disorder  Iron  Deficiency Anemia        Discussed the use of AI scribe software for clinical note transcription with the patient, who gave verbal consent to proceed. History of Present Illness Christie Meza is a 78 y.o. female referred by Shelda Atlas, MD for follow up on cardiac monitor post stroke.  Pt was admitted 10/4-10/9 with a L MCA stroke. Pt was placed on DAPT by neurology for 3 mos followed by ASA Rx. TTE showed no source for embolus. Neurology recommended OP event monitor. There are no results available yet. She was DC to inpt rehab. Pt was seen in the ED 09/17/24 with syncope.    ROS-See HPI***     Studies Reviewed:       *** Results   Risk Assessment/Calculations:{Does this patient have ATRIAL FIBRILLATION?:[phone]} No BP recorded.  {Refresh  Note OR Click here to enter BP  :1}***      Physical Exam:  VS:  There were no vitals taken for this visit.       Wt Readings from Last 3 Encounters:  09/17/24 249 lb 1.9 oz (113 kg)  08/06/24 233 lb 14.5 oz (106.1 kg)  07/27/24 233 lb 11 oz (106 kg)    Physical Exam***     Assessment and Plan:    Assessment & Plan History of stroke  Assessment and Plan Assessment & Plan    {      :1}    {Are you ordering a CV Procedure (e.g. stress test, cath, DCCV, TEE, etc)?   Press F2        :[id number]}  Dispo:  No follow-ups on file.  Signed, Glendia Ferrier, PA-C

## 2024-09-24 ENCOUNTER — Ambulatory Visit: Admitting: Physician Assistant

## 2024-09-24 DIAGNOSIS — Z8673 Personal history of transient ischemic attack (TIA), and cerebral infarction without residual deficits: Secondary | ICD-10-CM

## 2024-10-04 ENCOUNTER — Ambulatory Visit: Attending: Physician Assistant | Admitting: Physician Assistant
